# Patient Record
Sex: Female | Born: 1940 | Race: White | Hispanic: No | Marital: Married | Smoking: Never smoker
Health system: Southern US, Community
[De-identification: ages and names within clinical notes are randomized; demographics above are authoritative.]

## PROBLEM LIST (undated history)

## (undated) DIAGNOSIS — E785 Hyperlipidemia, unspecified: Secondary | ICD-10-CM

## (undated) DIAGNOSIS — H3530 Unspecified macular degeneration: Secondary | ICD-10-CM

## (undated) DIAGNOSIS — M1990 Unspecified osteoarthritis, unspecified site: Secondary | ICD-10-CM

## (undated) DIAGNOSIS — I10 Essential (primary) hypertension: Secondary | ICD-10-CM

## (undated) DIAGNOSIS — B019 Varicella without complication: Secondary | ICD-10-CM

## (undated) DIAGNOSIS — E119 Type 2 diabetes mellitus without complications: Secondary | ICD-10-CM

## (undated) DIAGNOSIS — K635 Polyp of colon: Secondary | ICD-10-CM

## (undated) DIAGNOSIS — D649 Anemia, unspecified: Secondary | ICD-10-CM

## (undated) HISTORY — DX: Type 2 diabetes mellitus without complications: E11.9

## (undated) HISTORY — DX: Unspecified macular degeneration: H35.30

## (undated) HISTORY — DX: Essential (primary) hypertension: I10

## (undated) HISTORY — DX: Anemia, unspecified: D64.9

## (undated) HISTORY — PX: BACK SURGERY: SHX140

## (undated) HISTORY — DX: Hyperlipidemia, unspecified: E78.5

## (undated) HISTORY — PX: BREAST EXCISIONAL BIOPSY: SUR124

## (undated) HISTORY — PX: BREAST SURGERY: SHX581

## (undated) HISTORY — PX: CATARACT EXTRACTION W/ INTRAOCULAR LENS  IMPLANT, BILATERAL: SHX1307

## (undated) HISTORY — DX: Polyp of colon: K63.5

---

## 1973-01-20 HISTORY — PX: EXCISION / BIOPSY BREAST / NIPPLE / DUCT: SUR469

## 1975-12-07 HISTORY — PX: BREAST SURGERY: SHX581

## 1979-12-07 HISTORY — PX: ABDOMINAL HYSTERECTOMY: SHX81

## 2004-07-06 HISTORY — PX: APPENDECTOMY: SHX54

## 2004-07-10 ENCOUNTER — Other Ambulatory Visit: Payer: Self-pay

## 2004-09-10 ENCOUNTER — Ambulatory Visit: Payer: Self-pay | Admitting: General Surgery

## 2004-10-12 ENCOUNTER — Ambulatory Visit: Payer: Self-pay | Admitting: General Surgery

## 2005-06-24 ENCOUNTER — Ambulatory Visit: Payer: Self-pay | Admitting: Family Medicine

## 2006-10-04 ENCOUNTER — Ambulatory Visit: Payer: Self-pay | Admitting: Family Medicine

## 2007-11-08 ENCOUNTER — Ambulatory Visit: Payer: Self-pay | Admitting: Family Medicine

## 2008-12-02 ENCOUNTER — Ambulatory Visit: Payer: Self-pay | Admitting: Specialist

## 2008-12-25 ENCOUNTER — Ambulatory Visit: Payer: Self-pay | Admitting: Family Medicine

## 2009-06-13 ENCOUNTER — Encounter: Admission: RE | Admit: 2009-06-13 | Discharge: 2009-06-13 | Payer: Self-pay | Admitting: Neurosurgery

## 2009-07-08 ENCOUNTER — Encounter: Admission: RE | Admit: 2009-07-08 | Discharge: 2009-07-08 | Payer: Self-pay | Admitting: Neurosurgery

## 2009-10-07 ENCOUNTER — Inpatient Hospital Stay (HOSPITAL_COMMUNITY): Admission: RE | Admit: 2009-10-07 | Discharge: 2009-10-10 | Payer: Self-pay | Admitting: Neurosurgery

## 2009-12-19 ENCOUNTER — Ambulatory Visit: Payer: Self-pay | Admitting: General Surgery

## 2010-01-06 ENCOUNTER — Ambulatory Visit: Payer: Self-pay | Admitting: Family Medicine

## 2010-12-22 ENCOUNTER — Ambulatory Visit: Payer: Self-pay

## 2011-01-13 ENCOUNTER — Ambulatory Visit: Payer: Self-pay | Admitting: Family Medicine

## 2011-03-10 LAB — GLUCOSE, CAPILLARY
Glucose-Capillary: 101 mg/dL — ABNORMAL HIGH (ref 70–99)
Glucose-Capillary: 103 mg/dL — ABNORMAL HIGH (ref 70–99)
Glucose-Capillary: 103 mg/dL — ABNORMAL HIGH (ref 70–99)
Glucose-Capillary: 128 mg/dL — ABNORMAL HIGH (ref 70–99)
Glucose-Capillary: 138 mg/dL — ABNORMAL HIGH (ref 70–99)
Glucose-Capillary: 146 mg/dL — ABNORMAL HIGH (ref 70–99)
Glucose-Capillary: 163 mg/dL — ABNORMAL HIGH (ref 70–99)
Glucose-Capillary: 173 mg/dL — ABNORMAL HIGH (ref 70–99)
Glucose-Capillary: 252 mg/dL — ABNORMAL HIGH (ref 70–99)

## 2011-03-11 LAB — CBC
MCV: 92.4 fL (ref 78.0–100.0)
Platelets: 374 10*3/uL (ref 150–400)
RBC: 3.52 MIL/uL — ABNORMAL LOW (ref 3.87–5.11)
RDW: 13.1 % (ref 11.5–15.5)

## 2011-03-11 LAB — BASIC METABOLIC PANEL
BUN: 18 mg/dL (ref 6–23)
Calcium: 9.9 mg/dL (ref 8.4–10.5)
Chloride: 104 mEq/L (ref 96–112)
Glucose, Bld: 113 mg/dL — ABNORMAL HIGH (ref 70–99)

## 2011-03-11 LAB — TYPE AND SCREEN: ABO/RH(D): A POS

## 2011-12-07 HISTORY — PX: EYE SURGERY: SHX253

## 2012-03-14 ENCOUNTER — Ambulatory Visit: Payer: Self-pay | Admitting: Family Medicine

## 2013-02-14 ENCOUNTER — Ambulatory Visit: Payer: Self-pay | Admitting: Family Medicine

## 2013-03-07 ENCOUNTER — Encounter: Payer: Self-pay | Admitting: General Surgery

## 2013-03-07 ENCOUNTER — Ambulatory Visit (INDEPENDENT_AMBULATORY_CARE_PROVIDER_SITE_OTHER): Payer: Medicare Other | Admitting: General Surgery

## 2013-03-07 VITALS — BP 124/72 | HR 72 | Resp 14 | Ht 62.0 in | Wt 205.0 lb

## 2013-03-07 DIAGNOSIS — Z86010 Personal history of colonic polyps: Secondary | ICD-10-CM

## 2013-03-07 MED ORDER — POLYETHYLENE GLYCOL 3350 17 GM/SCOOP PO POWD: ORAL | Status: DC

## 2013-03-07 NOTE — Progress Notes (Signed)
Patient ID: Jennifer Mcgee, female   DOB: Aug 02, 1941, 72 y.o.   MRN: [id number]  Chief Complaint  Patient presents with  . screening colonoscopy    HPI Jennifer Mcgee is a 72 y.o. female presents for pre-op colonoscopy. Patient's last colonoscopy was 3 years ago. At last colonoscopy polyps were found. No known family history of colon polyps or cancers. No complaints at this time. At last yearly physical patient's hemoglobin was low. She states she has a physical scheduled for next week.  HPI  Past Medical History  Diagnosis Date  . Hypertension   . Diabetes mellitus without complication   . Hyperlipidemia   . Macular degeneration   . Colon polyp   . Anemia     Past Surgical History  Procedure Laterality Date  . Colonoscopy    . Breast surgery    . Abdominal hysterectomy    . Appendectomy    . Back surgery    . Eye surgery Bilateral     cataract    Family History  Problem Relation Age of Onset  . Cancer Mother 69    breast     Social History History  Substance Use Topics  . Smoking status: Never Smoker   . Smokeless tobacco: Never Used  . Alcohol Use: No    Allergies  Allergen Reactions  . Epinephrine Other (See Comments)    Jerking movements all over the body.   . Penicillins Itching    Current Outpatient Prescriptions  Medication Sig Dispense Refill  . aspirin 81 MG tablet Take 81 mg by mouth daily.      . calcium carbonate (OS-CAL) 600 MG TABS Take 600 mg by mouth 2 (two) times daily with a meal.      . Cholecalciferol (VITAMIN D3) 3000 UNITS TABS Take 1 tablet by mouth daily.      . Cyanocobalamin (VITAMIN B 12 PO) Take 1,000 mg by mouth daily.      . Ferrous Sulfate (IRON) 325 (65 FE) MG TABS Take 1 tablet by mouth daily.      Marland Kitchen glipiZIDE (GLUCOTROL) 5 MG tablet Take 5 mg by mouth daily.       . hydrochlorothiazide (HYDRODIURIL) 12.5 MG tablet Take 12.5 mg by mouth daily.      . metFORMIN (GLUCOPHAGE) 1000 MG tablet Take 1,000 mg by mouth 2 (two)  times daily with a meal.       . Multiple Vitamins-Minerals (MULTIVITAMIN PO) Take 1 tablet by mouth daily.      . Multiple Vitamins-Minerals (PRESERVISION AREDS 2 PO) Take 2 tablets by mouth daily.      . polyethylene glycol powder (GLYCOLAX/MIRALAX) powder 255 grams one bottle for colonoscopy prep  255 g  0  . pravastatin (PRAVACHOL) 40 MG tablet Take 40 mg by mouth daily.      . quinapril (ACCUPRIL) 20 MG tablet Take 20 mg by mouth daily.        No current facility-administered medications for this visit.    Review of Systems Review of Systems  Constitutional: Negative.   Respiratory: Negative.   Cardiovascular: Negative.   Gastrointestinal: Negative.     Blood pressure 124/72, pulse 72, resp. rate 14, height 5\' 2"  (1.575 m), weight 205 lb (92.987 kg).  Physical Exam Physical Exam  Constitutional: She appears well-developed and well-nourished.  Eyes: Conjunctivae are normal. No scleral icterus.  Neck: Trachea normal. No mass and no thyromegaly present.  Cardiovascular: Normal rate, regular rhythm, normal heart sounds and normal  pulses.   Pulmonary/Chest: Effort normal and breath sounds normal.  Abdominal: Soft. Normal appearance and bowel sounds are normal. There is no hepatosplenomegaly. There is no tenderness. No hernia.    Data Reviewed    Assessment    History of colon polyp     Plan    F/U colopnoscopy        Lindley Stachnik G 03/09/2013, 12:17 PM

## 2013-03-07 NOTE — Patient Instructions (Addendum)
Patient to have colonoscopy scheduled. If normal patient to return in 5 years.    Colonoscopy A colonoscopy is an exam to evaluate your entire colon. In this exam, your colon is cleansed. A long fiberoptic tube is inserted through your rectum and into your colon. The fiberoptic scope (endoscope) is a long bundle of enclosed and very flexible fibers. These fibers transmit light to the area examined and send images from that area to your caregiver. Discomfort is usually minimal. You may be given a drug to help you sleep (sedative) during or prior to the procedure. This exam helps to detect lumps (tumors), polyps, inflammation, and areas of bleeding. Your caregiver may also take a small piece of tissue (biopsy) that will be examined under a microscope. LET YOUR CAREGIVER KNOW ABOUT:   Allergies to food or medicine.  Medicines taken, including vitamins, herbs, eyedrops, over-the-counter medicines, and creams.  Use of steroids (by mouth or creams).  Previous problems with anesthetics or numbing medicines.  History of bleeding problems or blood clots.  Previous surgery.  Other health problems, including diabetes and kidney problems.  Possibility of pregnancy, if this applies. BEFORE THE PROCEDURE   A clear liquid diet may be required for 2 days before the exam.  Ask your caregiver about changing or stopping your regular medications.  Liquid injections (enemas) or laxatives may be required.  A large amount of electrolyte solution may be given to you to drink over a short period of time. This solution is used to clean out your colon.  You should be present 60 minutes prior to your procedure or as directed by your caregiver. AFTER THE PROCEDURE   If you received a sedative or pain relieving medication, you will need to arrange for someone to drive you home.  Occasionally, there is a little blood passed with the first bowel movement. Do not be concerned. FINDING OUT THE RESULTS OF YOUR  TEST Not all test results are available during your visit. If your test results are not back during the visit, make an appointment with your caregiver to find out the results. Do not assume everything is normal if you have not heard from your caregiver or the medical facility. It is important for you to follow up on all of your test results. HOME CARE INSTRUCTIONS   It is not unusual to pass moderate amounts of gas and experience mild abdominal cramping following the procedure. This is due to air being used to inflate your colon during the exam. Walking or a warm pack on your belly (abdomen) may help.  You may resume all normal meals and activities after sedatives and medicines have worn off.  Only take over-the-counter or prescription medicines for pain, discomfort, or fever as directed by your caregiver. Do not use aspirin or blood thinners if a biopsy was taken. Consult your caregiver for medicine usage if biopsies were taken. SEEK IMMEDIATE MEDICAL CARE IF:   You have a fever.  You pass large blood clots or fill a toilet with blood following the procedure. This may also occur 10 to 14 days following the procedure. This is more likely if a biopsy was taken.  You develop abdominal pain that keeps getting worse and cannot be relieved with medicine. Document Released: 11/19/2000 Document Revised: 02/14/2012 Document Reviewed: 07/04/2008 [facility] Patient Information 2013 [place], [place].  Patient has been scheduled for a colonoscopy on 03-28-13 at [facility] She has been asked to hold metformin and glipizide day of colonoscopy prep and procedure.

## 2013-03-09 ENCOUNTER — Encounter: Payer: Self-pay | Admitting: General Surgery

## 2013-03-09 DIAGNOSIS — Z860100 Personal history of colon polyps, unspecified: Secondary | ICD-10-CM | POA: Insufficient documentation

## 2013-03-26 ENCOUNTER — Ambulatory Visit: Payer: Self-pay | Admitting: Family Medicine

## 2013-03-26 ENCOUNTER — Other Ambulatory Visit: Payer: Self-pay | Admitting: General Surgery

## 2013-03-26 ENCOUNTER — Encounter: Payer: Self-pay | Admitting: *Deleted

## 2013-03-26 DIAGNOSIS — Z86010 Personal history of colonic polyps: Secondary | ICD-10-CM

## 2013-03-26 LAB — HM DEXA SCAN

## 2013-03-28 ENCOUNTER — Ambulatory Visit: Payer: Self-pay | Admitting: General Surgery

## 2013-03-28 DIAGNOSIS — D129 Benign neoplasm of anus and anal canal: Secondary | ICD-10-CM

## 2013-03-28 DIAGNOSIS — D126 Benign neoplasm of colon, unspecified: Secondary | ICD-10-CM

## 2013-03-28 DIAGNOSIS — D128 Benign neoplasm of rectum: Secondary | ICD-10-CM

## 2013-03-28 DIAGNOSIS — Z86010 Personal history of colonic polyps: Secondary | ICD-10-CM

## 2013-03-28 DIAGNOSIS — K5730 Diverticulosis of large intestine without perforation or abscess without bleeding: Secondary | ICD-10-CM

## 2013-03-28 HISTORY — PX: COLONOSCOPY: SHX174

## 2013-03-28 LAB — HM COLONOSCOPY

## 2013-03-29 LAB — PATHOLOGY REPORT

## 2013-04-01 ENCOUNTER — Telehealth: Payer: Self-pay | Admitting: General Surgery

## 2013-04-01 NOTE — Telephone Encounter (Signed)
Pt was doing well after colonoscopy on wed 03/28/13. On Friday afternoon 03/30/13 she noted low grade fever. Yesterday and today she has had some left abd pain, and  fever to max of 100.8. Rx called in to [facility] [address] for Cipro 500mg  bid, Flagyl 500mg  tid-both for 7 days. Pt asked to come tomorrow am 8.30 for evaluation of suspected diverticulitis.

## 2013-04-02 ENCOUNTER — Ambulatory Visit (INDEPENDENT_AMBULATORY_CARE_PROVIDER_SITE_OTHER): Payer: Medicare Other | Admitting: General Surgery

## 2013-04-02 ENCOUNTER — Encounter: Payer: Self-pay | Admitting: General Surgery

## 2013-04-02 VITALS — BP 138/70 | HR 86 | Temp 97.9°F | Resp 18 | Ht 62.0 in | Wt 202.0 lb

## 2013-04-02 DIAGNOSIS — Z86010 Personal history of colonic polyps: Secondary | ICD-10-CM

## 2013-04-02 DIAGNOSIS — K5732 Diverticulitis of large intestine without perforation or abscess without bleeding: Secondary | ICD-10-CM

## 2013-04-02 NOTE — Progress Notes (Signed)
Patient ID: Jennifer Mcgee, female   DOB: [birth year], 72 y.o.   MRN: [id number]  Chief Complaint  Patient presents with  . Abdominal Pain    HPI Jennifer Mcgee is a 72 y.o. female here today for abdominal pain . She states she has been hurting since 03/30/13 in left abdomen with some low grade fever.  Has been using a heating pad. She also had   colonoscopy done 03/28/13. Colonoscopy showed extensive diverticulosis.  Patient was started on antibiotics-Cipro an Flagyl yesterday-please see telephone note from yesterday. HPI  Past Medical History  Diagnosis Date  . Hypertension   . Diabetes mellitus without complication   . Hyperlipidemia   . Macular degeneration   . Colon polyp   . Anemia     Past Surgical History  Procedure Laterality Date  . Colonoscopy  2014  . Breast surgery    . Abdominal hysterectomy    . Appendectomy    . Back surgery    . Eye surgery Bilateral     cataract    Family History  Problem Relation Age of Onset  . Cancer Mother 68    breast     Social History History  Substance Use Topics  . Smoking status: Never Smoker   . Smokeless tobacco: Never Used  . Alcohol Use: No    Allergies  Allergen Reactions  . Epinephrine Other (See Comments)    Jerking movements all over the body.   . Penicillins Itching    Current Outpatient Prescriptions  Medication Sig Dispense Refill  . aspirin 81 MG tablet Take 81 mg by mouth daily.      . calcium carbonate (OS-CAL) 600 MG TABS Take 600 mg by mouth 2 (two) times daily with a meal.      . Cholecalciferol (VITAMIN D3) 3000 UNITS TABS Take 1 tablet by mouth daily.      . Ferrous Sulfate (IRON) 325 (65 FE) MG TABS Take 1 tablet by mouth daily.      Marland Kitchen glipiZIDE (GLUCOTROL) 5 MG tablet Take 5 mg by mouth daily.       . hydrochlorothiazide (HYDRODIURIL) 12.5 MG tablet Take 12.5 mg by mouth daily.      . metFORMIN (GLUCOPHAGE) 1000 MG tablet Take 1,000 mg by mouth 2 (two) times daily with a meal.       .  Multiple Vitamins-Minerals (MULTIVITAMIN PO) Take 1 tablet by mouth daily.      . Multiple Vitamins-Minerals (PRESERVISION AREDS 2 PO) Take 2 tablets by mouth daily.      . polyethylene glycol powder (GLYCOLAX/MIRALAX) powder 255 grams one bottle for colonoscopy prep  255 g  0  . pravastatin (PRAVACHOL) 40 MG tablet Take 40 mg by mouth daily.      . quinapril (ACCUPRIL) 20 MG tablet Take 20 mg by mouth daily.       . Cyanocobalamin (VITAMIN B 12 PO) Take 1,000 mg by mouth daily.       No current facility-administered medications for this visit.    Review of Systems Review of Systems  Constitutional: Positive for fever. Negative for chills, diaphoresis, activity change, appetite change, fatigue and unexpected weight change.  Respiratory: Negative.   Cardiovascular: Negative.   Gastrointestinal: Positive for abdominal pain. Negative for nausea, vomiting, diarrhea, constipation, blood in stool, anal bleeding and rectal pain.    Blood pressure 138/70, pulse 86, temperature 97.9 F (36.6 C), resp. rate 18, height 5\' 2"  (1.575 m), weight 202 lb (91.627 kg).  Physical Exam Physical Exam  Constitutional: She appears well-developed and well-nourished.  Neck: Normal range of motion. Neck supple.  Cardiovascular: Normal rate, regular rhythm and normal heart sounds.   Pulmonary/Chest: Effort normal and breath sounds normal.  Abdominal: Soft. Bowel sounds are normal. There is tenderness in the left lower quadrant. There is guarding.    Data Reviewed None   Assessment   Sigdmoid diverticulitis    Plan    Continue with Cipro and Flagyl        Elston Aldape G 04/02/2013, 9:07 AM

## 2013-04-02 NOTE — Patient Instructions (Addendum)
Continue with Cipro and Flagyl. Call if pain worsens or additional abd symptoms.

## 2013-04-04 ENCOUNTER — Encounter: Payer: Self-pay | Admitting: General Surgery

## 2013-04-05 ENCOUNTER — Telehealth: Payer: Self-pay | Admitting: *Deleted

## 2013-04-05 NOTE — Telephone Encounter (Signed)
Phone call from patient states she has developed diarrhea from the Flagyl, she has taken it since Sunday.  Advised to stop taking it but to continue taking the Cipro. She agrees. She states the pain has completely stopped. She is aware that I will inform Dr Evette Cristal and if there any changes in care I would call her back, pt agrees.

## 2013-04-10 ENCOUNTER — Telehealth: Payer: Self-pay | Admitting: *Deleted

## 2013-04-10 NOTE — Telephone Encounter (Signed)
Notified patient as instructed, patient pleased. Discussed follow-up appointments (3 years), patient agrees

## 2013-04-10 NOTE — Telephone Encounter (Signed)
Message copied by Currie Paris on Tue Apr 10, 2013  8:41 AM ------      Message from: Kieth Brightly      Created: Tue Apr 10, 2013  7:33 AM       Please let pt pt know the pathology was normal-colon polyps ------

## 2013-04-13 ENCOUNTER — Ambulatory Visit: Payer: Self-pay | Admitting: Family Medicine

## 2013-04-18 ENCOUNTER — Encounter: Payer: Self-pay | Admitting: General Surgery

## 2013-04-24 ENCOUNTER — Encounter: Payer: Self-pay | Admitting: General Surgery

## 2013-04-24 ENCOUNTER — Ambulatory Visit (INDEPENDENT_AMBULATORY_CARE_PROVIDER_SITE_OTHER): Payer: Medicare Other | Admitting: General Surgery

## 2013-04-24 VITALS — BP 158/68 | HR 82 | Resp 14 | Ht 63.0 in | Wt 204.0 lb

## 2013-04-24 DIAGNOSIS — K5732 Diverticulitis of large intestine without perforation or abscess without bleeding: Secondary | ICD-10-CM

## 2013-04-24 DIAGNOSIS — Z86010 Personal history of colonic polyps: Secondary | ICD-10-CM

## 2013-04-24 NOTE — Patient Instructions (Addendum)
Patient to call with any abdominal symptoms.

## 2013-04-24 NOTE — Progress Notes (Signed)
Patient ID: Jennifer Mcgee, female   DOB: [birth year], 72 y.o.   MRN: [id number]  Chief Complaint  Patient presents with  . Follow-up    3 week follow up diverticulitis    Jennifer Mcgee is a 72 y.o. female who presents for a 3 week follow up for diverticulitis.  She previously stated she has been hurting since 03/30/13 in left abdomen with some low grade fever. Has been using a heating pad. She also had colonoscopy done 03/28/13. Colonoscopy showed extensive diverticulosis. Patient was started on antibiotics-Cipro an Flagyl.  She states she has completed both antibiotics and is feeling much better. The low grade fever has resolved. She states no complaints at this time.   Jennifer  Past Medical History  Diagnosis Date  . Hypertension   . Diabetes mellitus without complication   . Hyperlipidemia   . Macular degeneration   . Colon polyp   . Anemia     Past Surgical History  Procedure Laterality Date  . Colonoscopy  2014  . Breast surgery    . Abdominal hysterectomy    . Appendectomy    . Back surgery    . Eye surgery Bilateral     cataract    Family History  Problem Relation Age of Onset  . Cancer Mother 74    breast     Social History History  Substance Use Topics  . Smoking status: Never Smoker   . Smokeless tobacco: Never Used  . Alcohol Use: No    Allergies  Allergen Reactions  . Epinephrine Other (See Comments)    Jerking movements all over the body.   . Penicillins Itching    Current Outpatient Prescriptions  Medication Sig Dispense Refill  . aspirin 81 MG tablet Take 81 mg by mouth daily.      . calcium carbonate (OS-CAL) 600 MG TABS Take 600 mg by mouth 2 (two) times daily with a meal.      . Cholecalciferol (VITAMIN D3) 3000 UNITS TABS Take 1 tablet by mouth daily.      . Cyanocobalamin (VITAMIN B 12 PO) Take 1,000 mg by mouth daily.      . Ferrous Sulfate (IRON) 325 (65 FE) MG TABS Take 1 tablet by mouth daily.      Marland Kitchen glipiZIDE (GLUCOTROL) 5 MG  tablet Take 5 mg by mouth daily.       . hydrochlorothiazide (HYDRODIURIL) 12.5 MG tablet Take 12.5 mg by mouth daily.      . metFORMIN (GLUCOPHAGE) 1000 MG tablet Take 1,000 mg by mouth 2 (two) times daily with a meal.       . Multiple Vitamins-Minerals (MULTIVITAMIN PO) Take 1 tablet by mouth daily.      . Multiple Vitamins-Minerals (PRESERVISION AREDS 2 PO) Take 2 tablets by mouth daily.      . pravastatin (PRAVACHOL) 40 MG tablet Take 40 mg by mouth daily.      . quinapril (ACCUPRIL) 20 MG tablet Take 20 mg by mouth daily.        No current facility-administered medications for this visit.    Review of Systems Review of Systems  Constitutional: Negative.   Respiratory: Negative.   Cardiovascular: Negative.   Gastrointestinal: Negative.     Blood pressure 158/68, pulse 82, resp. rate 14, height 5\' 3"  (1.6 m), weight 204 lb (92.534 kg).  Physical Exam Physical Exam  Constitutional: She appears well-developed and well-nourished.  Abdominal: Normal appearance and bowel sounds are normal. There is no  hepatosplenomegaly. There is no tenderness. No hernia.    Data Reviewed None  Assessment    Diverticulitis resolved.     Plan    Call for any recurrent abdominal symptoms.       Jennifer Mcgee 04/24/2013, 11:20 AM

## 2013-07-11 ENCOUNTER — Other Ambulatory Visit: Payer: Self-pay

## 2013-10-11 ENCOUNTER — Other Ambulatory Visit: Payer: Self-pay

## 2014-03-29 LAB — CBC AND DIFFERENTIAL
HEMATOCRIT: 36 % (ref 36–46)
HEMOGLOBIN: 11.6 g/dL — AB (ref 12.0–16.0)
Platelets: 405 10*3/uL — AB (ref 150–399)
WBC: 7.7 10*3/mL

## 2014-03-29 LAB — BASIC METABOLIC PANEL
BUN: 21 mg/dL (ref 4–21)
CREATININE: 0.8 mg/dL (ref 0.5–1.1)
Glucose: 125 mg/dL
Potassium: 4.8 mmol/L (ref 3.4–5.3)
Sodium: 144 mmol/L (ref 137–147)

## 2014-03-29 LAB — LIPID PANEL
CHOLESTEROL: 207 mg/dL — AB (ref 0–200)
HDL: 62 mg/dL (ref 35–70)
LDL Cholesterol: 125 mg/dL
TRIGLYCERIDES: 100 mg/dL (ref 40–160)

## 2014-03-29 LAB — HEPATIC FUNCTION PANEL
ALT: 19 U/L (ref 7–35)
AST: 20 U/L (ref 13–35)

## 2014-04-19 ENCOUNTER — Ambulatory Visit: Payer: Self-pay | Admitting: Family Medicine

## 2014-04-19 LAB — HM MAMMOGRAPHY

## 2014-10-07 ENCOUNTER — Encounter: Payer: Self-pay | Admitting: General Surgery

## 2015-01-15 ENCOUNTER — Ambulatory Visit: Payer: Self-pay | Admitting: Family Medicine

## 2015-02-28 LAB — HEMOGLOBIN A1C: Hgb A1c MFr Bld: 6.3 % — AB (ref 4.0–6.0)

## 2015-04-03 DIAGNOSIS — H3530 Unspecified macular degeneration: Secondary | ICD-10-CM | POA: Insufficient documentation

## 2015-04-03 DIAGNOSIS — M109 Gout, unspecified: Secondary | ICD-10-CM | POA: Insufficient documentation

## 2015-04-03 DIAGNOSIS — M1990 Unspecified osteoarthritis, unspecified site: Secondary | ICD-10-CM | POA: Insufficient documentation

## 2015-04-03 DIAGNOSIS — I10 Essential (primary) hypertension: Secondary | ICD-10-CM | POA: Insufficient documentation

## 2015-04-03 DIAGNOSIS — Z9189 Other specified personal risk factors, not elsewhere classified: Secondary | ICD-10-CM | POA: Insufficient documentation

## 2015-04-03 DIAGNOSIS — J45909 Unspecified asthma, uncomplicated: Secondary | ICD-10-CM | POA: Insufficient documentation

## 2015-04-03 DIAGNOSIS — G8929 Other chronic pain: Secondary | ICD-10-CM | POA: Insufficient documentation

## 2015-04-03 DIAGNOSIS — K5790 Diverticulosis of intestine, part unspecified, without perforation or abscess without bleeding: Secondary | ICD-10-CM | POA: Insufficient documentation

## 2015-04-03 DIAGNOSIS — M549 Dorsalgia, unspecified: Secondary | ICD-10-CM

## 2015-04-03 DIAGNOSIS — D649 Anemia, unspecified: Secondary | ICD-10-CM | POA: Insufficient documentation

## 2015-04-03 DIAGNOSIS — R319 Hematuria, unspecified: Secondary | ICD-10-CM | POA: Insufficient documentation

## 2015-04-03 DIAGNOSIS — E870 Hyperosmolality and hypernatremia: Secondary | ICD-10-CM | POA: Insufficient documentation

## 2015-04-03 DIAGNOSIS — E119 Type 2 diabetes mellitus without complications: Secondary | ICD-10-CM | POA: Insufficient documentation

## 2015-04-03 DIAGNOSIS — F4320 Adjustment disorder, unspecified: Secondary | ICD-10-CM | POA: Insufficient documentation

## 2015-04-03 DIAGNOSIS — K635 Polyp of colon: Secondary | ICD-10-CM | POA: Insufficient documentation

## 2015-04-03 DIAGNOSIS — N952 Postmenopausal atrophic vaginitis: Secondary | ICD-10-CM | POA: Insufficient documentation

## 2015-04-03 DIAGNOSIS — E7800 Pure hypercholesterolemia, unspecified: Secondary | ICD-10-CM | POA: Insufficient documentation

## 2015-04-15 ENCOUNTER — Other Ambulatory Visit: Payer: Self-pay | Admitting: Family Medicine

## 2015-04-15 DIAGNOSIS — Z1231 Encounter for screening mammogram for malignant neoplasm of breast: Secondary | ICD-10-CM

## 2015-04-21 ENCOUNTER — Other Ambulatory Visit: Payer: Self-pay | Admitting: Family Medicine

## 2015-04-21 ENCOUNTER — Ambulatory Visit
Admission: RE | Admit: 2015-04-21 | Discharge: 2015-04-21 | Disposition: A | Discharge status: Home / Self Care | Payer: Medicare Other | Source: Ambulatory Visit | Attending: Family Medicine | Admitting: Family Medicine

## 2015-04-21 DIAGNOSIS — Z1231 Encounter for screening mammogram for malignant neoplasm of breast: Secondary | ICD-10-CM

## 2015-05-23 ENCOUNTER — Ambulatory Visit (INDEPENDENT_AMBULATORY_CARE_PROVIDER_SITE_OTHER): Payer: Medicare Other | Admitting: Physician Assistant

## 2015-05-23 ENCOUNTER — Encounter: Payer: Self-pay | Admitting: Physician Assistant

## 2015-05-23 VITALS — BP 162/78 | HR 105 | Temp 98.5°F | Resp 18

## 2015-05-23 DIAGNOSIS — I10 Essential (primary) hypertension: Secondary | ICD-10-CM | POA: Diagnosis not present

## 2015-05-23 DIAGNOSIS — E7800 Pure hypercholesterolemia, unspecified: Secondary | ICD-10-CM

## 2015-05-23 DIAGNOSIS — M10071 Idiopathic gout, right ankle and foot: Secondary | ICD-10-CM | POA: Diagnosis not present

## 2015-05-23 DIAGNOSIS — M109 Gout, unspecified: Secondary | ICD-10-CM

## 2015-05-23 MED ORDER — INDOMETHACIN 20 MG PO CAPS: 1.0000 | ORAL_CAPSULE | Freq: Three times a day (TID) | ORAL | Status: DC

## 2015-05-23 NOTE — Progress Notes (Signed)
   Subjective:    Patient ID: Jennifer Mcgee, female    DOB: 25-Feb-1941, 74 y.o.   MRN: [id number]  Toe Pain  The incident occurred 2 days ago. The incident occurred at the gym. There was no injury mechanism. The pain is present in the right toes (right 2nd toe at DIP joint). The quality of the pain is described as aching and stabbing. The pain is at a severity of 3/10. The pain is mild. The pain has been constant (worse when she wears a shoe or any compression to the joint) since onset. Pertinent negatives include no inability to bear weight, loss of motion, loss of sensation, muscle weakness, numbness or tingling. She reports no foreign bodies present. The symptoms are aggravated by palpation. She has tried nothing for the symptoms.      Review of Systems  Constitutional: Negative for fever and chills.  Gastrointestinal: Negative for nausea and vomiting.  Musculoskeletal: Positive for back pain (constant and not new), joint swelling (swelling over DIP joint right 2nd toe), arthralgias and gait problem. Negative for myalgias.  Skin: Negative for rash and wound. Color change: erythema.  Neurological: Negative for tingling and numbness.       Objective:   Physical Exam  Constitutional: She appears well-developed and well-nourished. No distress.  Musculoskeletal: Normal range of motion. She exhibits edema (mild edema over R 2nd toe DIP joint) and tenderness (TTP over R 2nd toe DIP joint line medial aspect).  Skin: Skin is warm and dry. No rash noted. She is not diaphoretic. There is erythema (over R 2nd DIP joint on right foot). No pallor.  Vitals reviewed.         Assessment & Plan:   1. Acute gout of right foot, unspecified cause Will check labs and treat as high suspicion.  Question if recently starting lipitor may have caused flare. - Uric acid - Sed Rate (ESR) - Indomethacin 20 MG CAPS; Take 1 capsule by mouth every 8 (eight) hours.  Dispense: 30 capsule; Refill: 0  2.  Hypercholesteremia Recently started lipitor.  Will check labs to check kidney function. - Comprehensive Metabolic Panel (CMET)  3. Essential hypertension Will check labs to check kidney function. - Comprehensive Metabolic Panel (CMET)

## 2015-05-23 NOTE — Patient Instructions (Signed)

## 2015-05-24 LAB — URIC ACID: URIC ACID: 5.7 mg/dL (ref 2.5–7.1)

## 2015-05-24 LAB — COMPREHENSIVE METABOLIC PANEL
ALK PHOS: 82 IU/L (ref 39–117)
ALT: 22 IU/L (ref 0–32)
AST: 21 IU/L (ref 0–40)
Albumin/Globulin Ratio: 1.7 (ref 1.1–2.5)
Albumin: 4.3 g/dL (ref 3.5–4.8)
BUN / CREAT RATIO: 22 (ref 11–26)
BUN: 19 mg/dL (ref 8–27)
Bilirubin Total: 0.4 mg/dL (ref 0.0–1.2)
CO2: 26 mmol/L (ref 18–29)
Calcium: 9.8 mg/dL (ref 8.7–10.3)
Chloride: 98 mmol/L (ref 97–108)
Creatinine, Ser: 0.86 mg/dL (ref 0.57–1.00)
GFR calc Af Amer: 78 mL/min/{1.73_m2} (ref >59)
GFR, EST NON AFRICAN AMERICAN: 67 mL/min/{1.73_m2} (ref >59)
GLOBULIN, TOTAL: 2.6 g/dL (ref 1.5–4.5)
Glucose: 123 mg/dL — ABNORMAL HIGH (ref 65–99)
Potassium: 4.6 mmol/L (ref 3.5–5.2)
SODIUM: 142 mmol/L (ref 134–144)
Total Protein: 6.9 g/dL (ref 6.0–8.5)

## 2015-05-24 LAB — SEDIMENTATION RATE: Sed Rate: 10 mm/hr (ref 0–40)

## 2015-05-26 ENCOUNTER — Telehealth: Payer: Self-pay

## 2015-05-26 ENCOUNTER — Telehealth: Payer: Self-pay | Admitting: Physician Assistant

## 2015-05-26 DIAGNOSIS — M109 Gout, unspecified: Secondary | ICD-10-CM

## 2015-05-26 NOTE — Telephone Encounter (Signed)
Pharmacy requested received from [facility] for a prior authorization for Tivorbex.

## 2015-05-26 NOTE — Telephone Encounter (Signed)
Patient advised as directed below. Patient verbalized understanding.  

## 2015-05-26 NOTE — Telephone Encounter (Signed)
-----   Message from Mar Daring, PA-C sent at 05/26/2015  9:30 AM EDT ----- All labs are within normal limits and stable.  Thanks! -JB

## 2015-05-26 NOTE — Telephone Encounter (Signed)
Patient advised of lab results. Patient verbalized understanding.

## 2015-05-30 MED ORDER — NAPROXEN 500 MG PO TABS: 500.0000 mg | ORAL_TABLET | Freq: Two times a day (BID) | ORAL | Status: DC

## 2015-05-30 NOTE — Telephone Encounter (Signed)
Spoke with patient about insurance denial of indomethacin.  Will treat with naprosyn.  She is to call the office on Monday if no relief and she has a f/u with Dr. Venia Minks on [facility] 06/04/15.

## 2015-06-04 ENCOUNTER — Ambulatory Visit (INDEPENDENT_AMBULATORY_CARE_PROVIDER_SITE_OTHER): Payer: Medicare Other | Admitting: Family Medicine

## 2015-06-04 ENCOUNTER — Encounter: Payer: Self-pay | Admitting: Family Medicine

## 2015-06-04 VITALS — BP 128/84 | HR 88 | Temp 97.9°F | Resp 16 | Wt 213.0 lb

## 2015-06-04 DIAGNOSIS — E7800 Pure hypercholesterolemia, unspecified: Secondary | ICD-10-CM

## 2015-06-04 DIAGNOSIS — E119 Type 2 diabetes mellitus without complications: Secondary | ICD-10-CM

## 2015-06-04 DIAGNOSIS — I10 Essential (primary) hypertension: Secondary | ICD-10-CM

## 2015-06-04 DIAGNOSIS — M10071 Idiopathic gout, right ankle and foot: Secondary | ICD-10-CM

## 2015-06-04 DIAGNOSIS — M109 Gout, unspecified: Secondary | ICD-10-CM

## 2015-06-04 NOTE — Assessment & Plan Note (Signed)
Unclear if had gout flare, labs normal. Will consider referral for joint aspiration if recurs.

## 2015-06-04 NOTE — Progress Notes (Signed)
Subjective:    Patient ID: Jennifer Mcgee, female    DOB: 03-31-1941, 74 y.o.   MRN: [id number]  Diabetes She presents for her follow-up (A1C on 02/28/2015 was 6.3%) diabetic visit. She has type 2 diabetes mellitus. There are no hypoglycemic associated symptoms. Pertinent negatives for hypoglycemia include no headaches or sweats. Pertinent negatives for diabetes include no blurred vision, no chest pain, no fatigue, no foot paresthesias, no foot ulcerations, no polydipsia, no polyphagia, no polyuria, no visual change, no weakness and no weight loss. Current diabetic treatment includes oral agent (dual therapy) (Metformin and Glipizide). Her weight is stable. Her breakfast blood glucose is taken between 6-7 am. Her breakfast blood glucose range is generally 110-130 mg/dl. She does not see a podiatrist.Eye exam is current.  Hypertension Pertinent negatives include no anxiety, blurred vision, chest pain, headaches, malaise/fatigue, neck pain, orthopnea, palpitations, peripheral edema, shortness of breath or sweats. Treatments tried: Quinapril and HCTZ.  Hyperlipidemia This is a chronic problem. The problem is controlled. Recent lipid tests were reviewed and are high. Exacerbating diseases include diabetes. Pertinent negatives include no chest pain or shortness of breath. Current antihyperlipidemic treatment includes statins (Atorvastatin 20 mg was started at [place]; pt needs lipids rechecked). Risk factors for coronary artery disease include diabetes mellitus, dyslipidemia and hypertension.      Review of Systems  Constitutional: Negative for weight loss, malaise/fatigue and fatigue.  Eyes: Negative for blurred vision.  Respiratory: Negative for shortness of breath.   Cardiovascular: Negative for chest pain, palpitations and orthopnea.  Endocrine: Negative for polydipsia, polyphagia and polyuria.  Musculoskeletal: Negative for neck pain.  Neurological: Negative for weakness and headaches.   BP  128/84 mmHg  Pulse 88  Temp(Src) 97.9 F (36.6 C) (Oral)  Resp 16  Wt 213 lb (96.616 kg)   Patient Active Problem List   Diagnosis Date Noted  . Adaptation reaction 04/03/2015  . Absolute anemia 04/03/2015  . At risk of disease 04/03/2015  . 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 Back pain, chronic 04/03/2015  . Colon polyp 04/03/2015  . Diabetes 04/03/2015  . 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 Gout 04/03/2015  . Blood in the urine 04/03/2015  . Calcium blood increased 04/03/2015  . Hypercholesteremia 04/03/2015  . High sodium levels 04/03/2015  . BP (high blood pressure) 04/03/2015  . 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 Arthritis, degenerative 04/03/2015  . RAD (reactive airway disease) 04/03/2015  . Diverticulitis of colon without hemorrhage 04/02/2013  . Personal history of colonic polyps 03/09/2013   Past Medical History  Diagnosis Date  . Hypertension   . Diabetes mellitus without complication   . Hyperlipidemia   . Macular degeneration   . Colon polyp   . Anemia    Current Outpatient Prescriptions on File Prior to Visit  Medication Sig  . ACCU-CHEK SMARTVIEW test strip CHECK DAILY  . albuterol (PROVENTIL HFA;VENTOLIN HFA) 108 (90 BASE) MCG/ACT inhaler Inhale into the lungs.  Marland Kitchen aspirin 81 MG tablet Take by mouth.  Marland Kitchen atorvastatin (LIPITOR) 20 MG tablet Take by mouth.  . Cholecalciferol (VITAMIN D3) 2000 UNITS TABS Take by mouth.  . Cyanocobalamin (VITAMIN B 12 PO) Take 1,000 mg by mouth daily.  . ferrous sulfate 325 (65 FE) MG tablet Take by mouth.  Marland Kitchen glipiZIDE (GLUCOTROL XL) 2.5 MG 24 hr tablet Take by mouth.  . hydrochlorothiazide (HYDRODIURIL) 12.5 MG tablet Take 12.5 mg by mouth daily.  Marland Kitchen MAGNESIUM GLUCONATE PO Take by mouth.  . metFORMIN (GLUCOPHAGE) 1000 MG tablet Take  1,000 mg by mouth 2 (two) times daily with a meal.   . Multiple Vitamins-Minerals (MULTIVITAMIN PO) Take 1 tablet by mouth daily.  . Multiple Vitamins-Minerals (PRESERVISION/LUTEIN) CAPS Take  by mouth.  . naproxen (NAPROSYN) 500 MG tablet Take 1 tablet (500 mg total) by mouth 2 (two) times daily with a meal.  . quinapril (ACCUPRIL) 20 MG tablet Take 20 mg by mouth daily.    No current facility-administered medications on file prior to visit.   Allergies  Allergen Reactions  . Epinephrine Other (See Comments)    Feels jumpy Jerking movements all over the body.   . Penicillins Itching  . Levaquin [Levofloxacin In D5w] Anxiety   Past Surgical History  Procedure Laterality Date  . Colonoscopy  2014  . Back surgery    . Abdominal hysterectomy  1981  . Breast surgery      biopsy- benign  . Breast surgery  1977    duct operation  . Appendectomy  07/2004    Dr. Jamal Collin  . Eye surgery Bilateral 2013    cataract  . Excision / biopsy breast / nipple / duct Left 01/20/1973    milk duct removed   History   Social History  . Marital Status: Married    Spouse Name: N/A  . Number of Children: N/A  . Years of Education: N/A   Occupational History  . Not on file.   Social History Main Topics  . Smoking status: Never Smoker   . Smokeless tobacco: Never Used  . Alcohol Use: Yes     Comment: rare  . Drug Use: No  . Sexual Activity: Not on file   Other Topics Concern  . Not on file   Social History Narrative   Family History  Problem Relation Age of Onset  . Cancer Mother 64    breast   . Hypertension Mother   . Heart disease Mother   . Hyperlipidemia Mother   . COPD Mother   . Hypertension Father   . Dementia Father   . Diabetes Paternal Grandmother   . Heart attack Paternal Grandfather           Objective:   Physical Exam  Constitutional: She is oriented to person, place, and time. She appears well-developed and well-nourished.  Musculoskeletal: Normal range of motion.  2nd toe with erythema, no tenderness today.   Neurological: She is alert and oriented to person, place, and time.  Psychiatric: She has a normal mood and affect. Her behavior is  normal. Judgment and thought content normal.   BP 128/84 mmHg  Pulse 88  Temp(Src) 97.9 F (36.6 C) (Oral)  Resp 16  Wt 213 lb (96.616 kg)        Assessment & Plan:  1. Essential hypertension Improved today. Continue current medication for now. Consider changing HCTZ if has gout.   Follow up for Wellness in 3 months.    2. Type 2 diabetes mellitus without complication Will check labs today.  - Hemoglobin A1c  3. Hypercholesteremia No trouble with medication. Check labs today.  - Lipid Profile - Comprehensive metabolic panel  4. Acute gout of right foot, unspecified cause Resolved. Labs did not confirm gout. Will readdress and consider joint aspiration if occurs again.   [name], MD

## 2015-06-05 LAB — COMPREHENSIVE METABOLIC PANEL
ALK PHOS: 83 IU/L (ref 39–117)
ALT: 22 IU/L (ref 0–32)
AST: 23 IU/L (ref 0–40)
Albumin/Globulin Ratio: 1.7 (ref 1.1–2.5)
Albumin: 4.3 g/dL (ref 3.5–4.8)
BILIRUBIN TOTAL: 0.6 mg/dL (ref 0.0–1.2)
BUN / CREAT RATIO: 26 (ref 11–26)
BUN: 22 mg/dL (ref 8–27)
CALCIUM: 9.6 mg/dL (ref 8.7–10.3)
CO2: 27 mmol/L (ref 18–29)
Chloride: 105 mmol/L (ref 97–108)
Creatinine, Ser: 0.86 mg/dL (ref 0.57–1.00)
GFR calc Af Amer: 78 mL/min/{1.73_m2} (ref >59)
GFR calc non Af Amer: 67 mL/min/{1.73_m2} (ref >59)
GLUCOSE: 116 mg/dL — AB (ref 65–99)
Globulin, Total: 2.6 g/dL (ref 1.5–4.5)
POTASSIUM: 5.3 mmol/L — AB (ref 3.5–5.2)
Sodium: 147 mmol/L — ABNORMAL HIGH (ref 134–144)
Total Protein: 6.9 g/dL (ref 6.0–8.5)

## 2015-06-05 LAB — LIPID PANEL
Chol/HDL Ratio: 2.9 ratio units (ref 0.0–4.4)
Cholesterol, Total: 188 mg/dL (ref 100–199)
HDL: 65 mg/dL (ref >39)
LDL Calculated: 103 mg/dL — ABNORMAL HIGH (ref 0–99)
TRIGLYCERIDES: 102 mg/dL (ref 0–149)
VLDL Cholesterol Cal: 20 mg/dL (ref 5–40)

## 2015-06-05 LAB — HEMOGLOBIN A1C
ESTIMATED AVERAGE GLUCOSE: 143 mg/dL
Hgb A1c MFr Bld: 6.6 % — ABNORMAL HIGH (ref 4.8–5.6)

## 2015-07-22 ENCOUNTER — Other Ambulatory Visit: Payer: Self-pay

## 2015-07-22 MED ORDER — METFORMIN HCL 1000 MG PO TABS
1000.0000 mg | ORAL_TABLET | Freq: Two times a day (BID) | ORAL | Status: DC
Start: 2015-07-22 — End: 2015-07-23

## 2015-07-22 MED ORDER — ATORVASTATIN CALCIUM 20 MG PO TABS: 20.0000 mg | ORAL_TABLET | Freq: Every day | ORAL | Status: DC

## 2015-07-23 ENCOUNTER — Other Ambulatory Visit: Payer: Self-pay

## 2015-07-23 DIAGNOSIS — E119 Type 2 diabetes mellitus without complications: Secondary | ICD-10-CM

## 2015-07-23 DIAGNOSIS — E7800 Pure hypercholesterolemia, unspecified: Secondary | ICD-10-CM

## 2015-07-23 MED ORDER — ATORVASTATIN CALCIUM 20 MG PO TABS: 20.0000 mg | ORAL_TABLET | Freq: Every day | ORAL | Status: DC

## 2015-07-23 MED ORDER — METFORMIN HCL 1000 MG PO TABS: 1000.0000 mg | ORAL_TABLET | Freq: Two times a day (BID) | ORAL | Status: DC

## 2015-07-23 NOTE — Telephone Encounter (Signed)
This is a pt of Dr. Sharyon Medicus. Last iv was on 06/04/2015 and a future ov is on 09/18/2015.  Thanks,

## 2015-08-28 ENCOUNTER — Other Ambulatory Visit: Payer: Self-pay | Admitting: Family Medicine

## 2015-08-28 DIAGNOSIS — E119 Type 2 diabetes mellitus without complications: Secondary | ICD-10-CM

## 2015-09-18 ENCOUNTER — Encounter: Payer: Medicare Other | Admitting: Family Medicine

## 2015-09-30 ENCOUNTER — Ambulatory Visit (INDEPENDENT_AMBULATORY_CARE_PROVIDER_SITE_OTHER): Payer: Medicare Other | Admitting: Family Medicine

## 2015-09-30 ENCOUNTER — Encounter: Payer: Self-pay | Admitting: Family Medicine

## 2015-09-30 VITALS — BP 150/68 | HR 110 | Temp 98.8°F | Resp 20

## 2015-09-30 DIAGNOSIS — R059 Cough, unspecified: Secondary | ICD-10-CM

## 2015-09-30 DIAGNOSIS — J4531 Mild persistent asthma with (acute) exacerbation: Secondary | ICD-10-CM

## 2015-09-30 DIAGNOSIS — J40 Bronchitis, not specified as acute or chronic: Secondary | ICD-10-CM | POA: Diagnosis not present

## 2015-09-30 DIAGNOSIS — R05 Cough: Secondary | ICD-10-CM

## 2015-09-30 MED ORDER — AZITHROMYCIN 250 MG PO TABS: ORAL_TABLET | ORAL | Status: DC

## 2015-09-30 MED ORDER — PREDNISONE 10 MG PO TABS: ORAL_TABLET | ORAL | Status: DC

## 2015-09-30 MED ORDER — HYDROCODONE-HOMATROPINE 5-1.5 MG/5ML PO SYRP: 5.0000 mL | ORAL_SOLUTION | Freq: Three times a day (TID) | ORAL | Status: DC | PRN

## 2015-09-30 NOTE — Progress Notes (Signed)
Subjective:    Patient ID: Jennifer Mcgee, female    DOB: [birth year], 74 y.o.   MRN: [id number]  URI  This is a new problem. The current episode started yesterday. The problem has been gradually worsening. The maximum temperature recorded prior to her arrival was 101 - 101.9 F. The fever has been present for less than 1 day. Associated symptoms include chest pain, congestion, coughing, diarrhea, headaches, neck pain, rhinorrhea, sneezing, a sore throat and wheezing. Pertinent negatives include no abdominal pain, dysuria, ear pain, joint pain, joint swelling, nausea, plugged ear sensation, rash, sinus pain, swollen glands or vomiting. She has tried inhaler use for the symptoms. The treatment provided no relief (Struggling to breathe. ).  Pt reports she has a H/O RAD. Has had very similar symptoms in the past. Gets something like this once or twice a year. Here with her husband today.     Review of Systems  HENT: Positive for congestion, rhinorrhea, sneezing and sore throat. Negative for ear pain.   Respiratory: Positive for cough and wheezing.   Cardiovascular: Positive for chest pain.  Gastrointestinal: Positive for diarrhea. Negative for nausea, vomiting and abdominal pain.  Genitourinary: Negative for dysuria.  Musculoskeletal: Positive for neck pain. Negative for joint pain.  Skin: Negative for rash.  Neurological: Positive for headaches.   BP 150/68 mmHg  Pulse 110  Temp(Src) 98.8 F (37.1 C) (Oral)  Resp 20  SpO2 94%   Patient Active Problem List   Diagnosis Date Noted  . Adaptation reaction 04/03/2015  . Absolute anemia 04/03/2015  . At risk of disease 04/03/2015  . 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 Back pain, chronic 04/03/2015  . Colon polyp 04/03/2015  . Diabetes ([place]) 04/03/2015  . 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 Gout 04/03/2015  . Blood in the urine 04/03/2015  . Calcium blood increased 04/03/2015  . Hypercholesteremia 04/03/2015  . High sodium levels  04/03/2015  . BP (high blood pressure) 04/03/2015  . 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 Arthritis, degenerative 04/03/2015  . RAD (reactive airway disease) 04/03/2015  . Diverticulitis of colon without hemorrhage 04/02/2013  . Personal history of colonic polyps 03/09/2013   Past Medical History  Diagnosis Date  . Hypertension   . Diabetes mellitus without complication ([place])   . Hyperlipidemia   . Macular degeneration   . Colon polyp   . Anemia    Current Outpatient Prescriptions on File Prior to Visit  Medication Sig  . ACCU-CHEK SMARTVIEW test strip CHECK DAILY  . albuterol (PROVENTIL HFA;VENTOLIN HFA) 108 (90 BASE) MCG/ACT inhaler Inhale into the lungs.  Marland Kitchen aspirin 81 MG tablet Take by mouth.  Marland Kitchen atorvastatin (LIPITOR) 20 MG tablet Take 1 tablet (20 mg total) by mouth at bedtime.  . Cholecalciferol (VITAMIN D3) 2000 UNITS TABS Take by mouth.  . Cyanocobalamin (VITAMIN B 12 PO) Take 1,000 mg by mouth daily.  . ferrous sulfate 325 (65 FE) MG tablet Take by mouth.  Marland Kitchen GLIPIZIDE XL 2.5 MG 24 hr tablet TAKE 1 TABLET EVERY DAY  . hydrochlorothiazide (HYDRODIURIL) 12.5 MG tablet Take 12.5 mg by mouth daily.  Marland Kitchen MAGNESIUM GLUCONATE PO Take by mouth.  . metFORMIN (GLUCOPHAGE) 1000 MG tablet Take 1 tablet (1,000 mg total) by mouth 2 (two) times daily with a meal.  . Multiple Vitamins-Minerals (MULTIVITAMIN PO) Take 1 tablet by mouth daily.  . Multiple Vitamins-Minerals (PRESERVISION/LUTEIN) CAPS Take by mouth.  . naproxen (NAPROSYN) 500 MG tablet Take 1 tablet (500 mg total) by mouth 2 (  two) times daily with a meal.  . quinapril (ACCUPRIL) 20 MG tablet Take 20 mg by mouth daily.    No current facility-administered medications on file prior to visit.   Allergies  Allergen Reactions  . Epinephrine Other (See Comments)    Feels jumpy Jerking movements all over the body.   . Penicillins Itching  . Levaquin [Levofloxacin In D5w] Anxiety   Past Surgical History  Procedure Laterality Date    . Colonoscopy  2014  . Back surgery    . Abdominal hysterectomy  1981  . Breast surgery      biopsy- benign  . Breast surgery  1977    duct operation  . Appendectomy  07/2004    Dr. Jamal Collin  . Eye surgery Bilateral 2013    cataract  . Excision / biopsy breast / nipple / duct Left 01/20/1973    milk duct removed   Social History   Social History  . Marital Status: Married    Spouse Name: N/A  . Number of Children: N/A  . Years of Education: N/A   Occupational History  . Not on file.   Social History Main Topics  . Smoking status: Never Smoker   . Smokeless tobacco: Never Used  . Alcohol Use: Yes     Comment: rare  . Drug Use: No  . Sexual Activity: Not on file   Other Topics Concern  . Not on file   Social History Narrative   Family History  Problem Relation Age of Onset  . Cancer Mother 59    breast   . Hypertension Mother   . Heart disease Mother   . Hyperlipidemia Mother   . COPD Mother   . Hypertension Father   . Dementia Father   . Diabetes Paternal Grandmother   . Heart attack Paternal Grandfather       Objective:   Physical Exam  Constitutional: She is oriented to person, place, and time. She appears well-developed and well-nourished.  HENT:  Head: Normocephalic and atraumatic.  Right Ear: External ear normal.  Left Ear: External ear normal.  Mouth/Throat: Oropharynx is clear and moist.  Eyes: Conjunctivae and EOM are normal. Pupils are equal, round, and reactive to light.  Neck: Normal range of motion. Neck supple.  Cardiovascular: Regular rhythm.   Increased rate.   Pulmonary/Chest: Effort normal. She has wheezes.  Some mildly increased WOB.   Neurological: She is alert and oriented to person, place, and time.  Psychiatric: She has a normal mood and affect. Her behavior is normal. Judgment and thought content normal.  BP 150/68 mmHg  Pulse 110  Temp(Src) 98.8 F (37.1 C) (Oral)  Resp 20  SpO2 94%     Assessment & Plan:  1.  Cough Negative for flu.  Will give cough syrup to help with sleep.  - POCT Influenza A/B - HYDROcodone-homatropine (HYCODAN) 5-1.5 MG/5ML syrup; Take 5 mLs by mouth every 8 (eight) hours as needed for cough.  Dispense: 120 mL; Refill: 0  2. RAD (reactive airway disease), mild persistent, with acute exacerbation Condition is worsening. Will start medication for better control.  Patient instructed to call back if condition worsens or does not improve.    - predniSONE (DELTASONE) 10 MG tablet; 6 po for 2 days and then 5 po for 2 days and then 4 po for 2 days and 3 po for 2 days and then 2 po for 2 days and then 1 po for 2 days.  Dispense: 42 tablet; Refill:  0  3. Bronchitis Will treat with antibiotic in light of steroids and fever. Unclear if bacterial or viral. Will hold off on CXR. Call if worsens or does not improve.  - azithromycin (ZITHROMAX) 250 MG tablet; 2 today and then one a day for 4 more days.  Dispense: 6 tablet; Refill: 0  [name], MD

## 2015-10-02 ENCOUNTER — Other Ambulatory Visit: Payer: Self-pay | Admitting: Family Medicine

## 2015-10-02 LAB — POCT INFLUENZA A/B
INFLUENZA A, POC: NEGATIVE
Influenza B, POC: NEGATIVE

## 2015-10-04 ENCOUNTER — Other Ambulatory Visit: Payer: Self-pay | Admitting: Family Medicine

## 2015-10-04 DIAGNOSIS — I10 Essential (primary) hypertension: Secondary | ICD-10-CM

## 2015-10-06 ENCOUNTER — Other Ambulatory Visit: Payer: Self-pay

## 2015-10-06 DIAGNOSIS — I10 Essential (primary) hypertension: Secondary | ICD-10-CM

## 2015-10-06 MED ORDER — HYDROCHLOROTHIAZIDE 12.5 MG PO TABS
12.5000 mg | ORAL_TABLET | Freq: Every day | ORAL | Status: DC
Start: 2015-10-06 — End: 2016-03-27

## 2015-10-28 ENCOUNTER — Encounter: Payer: Self-pay | Admitting: Family Medicine

## 2015-10-28 ENCOUNTER — Ambulatory Visit (INDEPENDENT_AMBULATORY_CARE_PROVIDER_SITE_OTHER): Payer: Medicare Other | Admitting: Family Medicine

## 2015-10-28 VITALS — BP 158/74 | HR 105 | Temp 99.4°F | Resp 16

## 2015-10-28 DIAGNOSIS — R059 Cough, unspecified: Secondary | ICD-10-CM

## 2015-10-28 DIAGNOSIS — R05 Cough: Secondary | ICD-10-CM | POA: Diagnosis not present

## 2015-10-28 DIAGNOSIS — J45901 Unspecified asthma with (acute) exacerbation: Secondary | ICD-10-CM

## 2015-10-28 MED ORDER — FLUTICASONE-SALMETEROL 250-50 MCG/DOSE IN AEPB: 1.0000 | INHALATION_SPRAY | Freq: Two times a day (BID) | RESPIRATORY_TRACT | Status: DC

## 2015-10-28 MED ORDER — ALBUTEROL SULFATE HFA 108 (90 BASE) MCG/ACT IN AERS: 2.0000 | INHALATION_SPRAY | Freq: Four times a day (QID) | RESPIRATORY_TRACT | Status: DC | PRN

## 2015-10-28 MED ORDER — HYDROCODONE-HOMATROPINE 5-1.5 MG/5ML PO SYRP: 5.0000 mL | ORAL_SOLUTION | Freq: Three times a day (TID) | ORAL | Status: DC | PRN

## 2015-10-28 MED ORDER — LEVALBUTEROL HCL 1.25 MG/0.5ML IN NEBU: 1.2500 mg | INHALATION_SOLUTION | Freq: Once | RESPIRATORY_TRACT | Status: DC

## 2015-10-28 MED ORDER — DOXYCYCLINE HYCLATE 100 MG PO TABS: 100.0000 mg | ORAL_TABLET | Freq: Two times a day (BID) | ORAL | Status: DC

## 2015-10-28 NOTE — Progress Notes (Signed)
Patient ID: Jennifer Mcgee, female   DOB: [birth year], 74 y.o.   MRN: [id number] Name: GHIANNA DEVOL   MRN: [id number]    DOB: [birth year]   Date:10/28/2015       Progress Note  Subjective  Chief Complaint  Chief Complaint  Patient presents with  . Fever  . Cough    Fever  This is a recurrent problem. Episode onset: 3 days ago. The problem occurs constantly. The maximum temperature noted was 100 to 100.9 F. The temperature was taken using an oral thermometer. Associated symptoms include congestion, coughing and headaches. Pertinent negatives include no ear pain, nausea, sore throat or vomiting. Associated symptoms comments: Dull headache with some muscle aches. Post nasal drip. No sore throat, earache or rhinorrhea..  Cough This is a recurrent problem. The current episode started in the past 7 days. The problem occurs constantly. The cough is non-productive. Associated symptoms include a fever and headaches. Pertinent negatives include no ear pain or sore throat. Nothing aggravates the symptoms. She has tried OTC cough suppressant and a beta-agonist inhaler for the symptoms.   Patient Active Problem List   Diagnosis Date Noted  . Bronchitis 09/30/2015  . Adaptation reaction 04/03/2015  . Absolute anemia 04/03/2015  . At risk of disease 04/03/2015  . 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 Back pain, chronic 04/03/2015  . Colon polyp 04/03/2015  . Diabetes ([place]) 04/03/2015  . 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 Gout 04/03/2015  . Blood in the urine 04/03/2015  . Calcium blood increased 04/03/2015  . Hypercholesteremia 04/03/2015  . High sodium levels 04/03/2015  . BP (high blood pressure) 04/03/2015  . 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 Arthritis, degenerative 04/03/2015  . RAD (reactive airway disease) 04/03/2015  . Diverticulitis of colon without hemorrhage 04/02/2013  . Personal history of colonic polyps 03/09/2013    Past Surgical History  Procedure Laterality Date  .  Colonoscopy  2014  . Back surgery    . Abdominal hysterectomy  1981  . Breast surgery      biopsy- benign  . Breast surgery  1977    duct operation  . Appendectomy  07/2004    Dr. Jamal Collin  . Eye surgery Bilateral 2013    cataract  . Excision / biopsy breast / nipple / duct Left 01/20/1973    milk duct removed   Family History  Problem Relation Age of Onset  . Cancer Mother 26    breast   . Hypertension Mother   . Heart disease Mother   . Hyperlipidemia Mother   . COPD Mother   . Hypertension Father   . Dementia Father   . Diabetes Paternal Grandmother   . Heart attack Paternal Grandfather    Past Medical History  Diagnosis Date  . Hypertension   . Diabetes mellitus without complication ([place])   . Hyperlipidemia   . Macular degeneration   . Colon polyp   . Anemia    Social History  Substance Use Topics  . Smoking status: Never Smoker   . Smokeless tobacco: Never Used  . Alcohol Use: Yes     Comment: rare    Current outpatient prescriptions:  .  ACCU-CHEK SMARTVIEW test strip, CHECK DAILY, Disp: , Rfl: 3 .  albuterol (PROVENTIL HFA;VENTOLIN HFA) 108 (90 BASE) MCG/ACT inhaler, Inhale into the lungs., Disp: , Rfl:  .  aspirin 81 MG tablet, Take by mouth., Disp: , Rfl:  .  atorvastatin (LIPITOR) 20 MG tablet, Take 1 tablet (20 mg total) by  mouth at bedtime., Disp: 90 tablet, Rfl: 3 .  Cholecalciferol (VITAMIN D3) 2000 UNITS TABS, Take by mouth., Disp: , Rfl:  .  Cyanocobalamin (VITAMIN B 12 PO), Take 1,000 mg by mouth daily., Disp: , Rfl:  .  ferrous sulfate 325 (65 FE) MG tablet, Take by mouth., Disp: , Rfl:  .  GLIPIZIDE XL 2.5 MG 24 hr tablet, TAKE 1 TABLET EVERY DAY, Disp: 90 tablet, Rfl: 3 .  hydrochlorothiazide (HYDRODIURIL) 12.5 MG tablet, Take 1 tablet (12.5 mg total) by mouth daily., Disp: 90 tablet, Rfl: 1 .  HYDROcodone-homatropine (HYCODAN) 5-1.5 MG/5ML syrup, Take 5 mLs by mouth every 8 (eight) hours as needed for cough., Disp: 120 mL, Rfl: 0 .  MAGNESIUM  GLUCONATE PO, Take by mouth., Disp: , Rfl:  .  metFORMIN (GLUCOPHAGE) 1000 MG tablet, Take 1 tablet (1,000 mg total) by mouth 2 (two) times daily with a meal., Disp: 180 tablet, Rfl: 3 .  Multiple Vitamins-Minerals (PRESERVISION/LUTEIN) CAPS, Take by mouth., Disp: , Rfl:  .  naproxen (NAPROSYN) 500 MG tablet, Take 1 tablet (500 mg total) by mouth 2 (two) times daily with a meal., Disp: 30 tablet, Rfl: 0 .  quinapril (ACCUPRIL) 20 MG tablet, TAKE 1 TABLET DAILY BY MOUTH, Disp: 90 tablet, Rfl: 1  Allergies  Allergen Reactions  . Epinephrine Other (See Comments)    Feels jumpy Jerking movements all over the body.   . Penicillins Itching  . Levaquin [Levofloxacin In D5w] Anxiety   Review of Systems  Constitutional: Positive for fever.  HENT: Positive for congestion. Negative for ear pain and sore throat.   Eyes: Negative.   Respiratory: Positive for cough.   Cardiovascular: Negative.   Gastrointestinal: Negative.  Negative for nausea and vomiting.  Genitourinary: Negative.   Musculoskeletal: Negative.   Skin: Negative.   Neurological: Positive for headaches.  Endo/Heme/Allergies: Negative.   Psychiatric/Behavioral: Negative.    Objective  Filed Vitals:   10/28/15 1017  BP: 158/74  Pulse: 105  Temp: 99.4 F (37.4 C)  TempSrc: Oral  Resp: 16  SpO2: 93%   Physical Exam  Constitutional: She is oriented to person, place, and time and well-developed, well-nourished, and in no distress.  HENT:  Head: Normocephalic.  Right Ear: External ear normal.  Left Ear: External ear normal.  Hazy TM's with questionable cloudy fluid. Not red.   Eyes: Conjunctivae and EOM are normal.  Neck: Normal range of motion. Neck supple.  Cardiovascular: Regular rhythm.   Slightly tachycardic.  Pulmonary/Chest: She has wheezes.  Rhonchi.  Lymphadenopathy:    She has no cervical adenopathy.  Neurological: She is alert and oriented to person, place, and time.  Psychiatric: Affect and judgment  normal.    Recent Results (from the past 2160 hour(s))  POCT Influenza A/B     Status: Normal   Collection Time: 10/02/15  1:17 PM  Result Value Ref Range   Influenza A, POC Negative Negative   Influenza B, POC Negative Negative   Assessment & Plan  1. Asthmatic bronchitis, unspecified asthma severity, with acute exacerbation Onset ober the past 3-4 days with cough and fever. Wheezing today mild. Low grade fever at the present without dyspnea. Will treat with bronchodilator, antibiotic and LABA with ICS. Increase fluid intake and recheck if no better in 4-5 days. If wheezing worsens or is uncontrolled, may need to return to the office for nebulizer treatment or go to the ER. - albuterol (PROVENTIL HFA;VENTOLIN HFA) 108 (90 BASE) MCG/ACT inhaler; Inhale 2 puffs into  the lungs every 6 (six) hours as needed for wheezing or shortness of breath.  Dispense: 1 Inhaler; Refill: 3 - doxycycline (VIBRA-TABS) 100 MG tablet; Take 1 tablet (100 mg total) by mouth 2 (two) times daily.  Dispense: 20 tablet; Refill: 0 - Fluticasone-Salmeterol (ADVAIR DISKUS) 250-50 MCG/DOSE AEPB; Inhale 1 puff into the lungs 2 (two) times daily.  Dispense: 1 each; Refill: 0  2. Cough Will give cough suppressant to use primarily at night to allow her rest better. - HYDROcodone-homatropine (HYCODAN) 5-1.5 MG/5ML syrup; Take 5 mLs by mouth every 8 (eight) hours as needed for cough.  Dispense: 120 mL; Refill: 0

## 2015-11-05 ENCOUNTER — Ambulatory Visit (INDEPENDENT_AMBULATORY_CARE_PROVIDER_SITE_OTHER): Payer: Medicare Other | Admitting: Family Medicine

## 2015-11-05 ENCOUNTER — Encounter: Payer: Self-pay | Admitting: Family Medicine

## 2015-11-05 VITALS — BP 132/80 | HR 88 | Temp 98.1°F | Resp 16 | Ht 62.5 in | Wt 206.0 lb

## 2015-11-05 DIAGNOSIS — Z0000 Encounter for general adult medical examination without abnormal findings: Secondary | ICD-10-CM | POA: Diagnosis not present

## 2015-11-05 DIAGNOSIS — E119 Type 2 diabetes mellitus without complications: Secondary | ICD-10-CM | POA: Diagnosis not present

## 2015-11-05 DIAGNOSIS — Z13820 Encounter for screening for osteoporosis: Secondary | ICD-10-CM | POA: Diagnosis not present

## 2015-11-05 NOTE — Progress Notes (Signed)
Patient ID: Jennifer Mcgee, female   DOB: 09/02/1941, 74 y.o.   MRN: [id number]         Patient: Jennifer Mcgee, Female    DOB: 08-08-1941, 74 y.o.   MRN: [id number] Visit Date: 11/05/2015  Today's Provider: [name], MD   Chief Complaint  Patient presents with  . Annual Exam   Subjective:    Annual wellness visit Jennifer Mcgee is a 74 y.o. female. She feels fairly well. She reports exercising regularly. She reports she is sleeping well. Chronic problems stable. Does need blood sugar checked. Still with cough from infection. Is improving. Will call if does not improve.   -----------------------------------------------------------   Review of Systems  Constitutional: Positive for fatigue. Negative for fever, chills, diaphoresis, activity change, appetite change and unexpected weight change.  HENT: Positive for congestion. Negative for dental problem, drooling, ear discharge, ear pain, facial swelling, hearing loss, mouth sores, nosebleeds, postnasal drip, rhinorrhea, sinus pressure, sneezing, sore throat, tinnitus, trouble swallowing and voice change.   Eyes: Positive for visual disturbance. Negative for photophobia, pain, discharge, redness and itching.  Respiratory: Positive for cough. Negative for apnea, choking, chest tightness, shortness of breath, wheezing and stridor.   Cardiovascular: Negative for chest pain, palpitations and leg swelling.  Gastrointestinal: Negative.   Endocrine: Negative.   Genitourinary: Negative.   Musculoskeletal: Positive for back pain. Negative for myalgias, joint swelling, arthralgias, gait problem, neck pain and neck stiffness.  Skin: Negative.   Allergic/Immunologic: Negative.   Neurological: Negative.   Hematological: Negative.   Psychiatric/Behavioral: Negative.     Social History   Social History  . Marital Status: Married    Spouse Name: N/A  . Number of Children: N/A  . Years of Education: N/A   Occupational History  .  Not on file.   Social History Main Topics  . Smoking status: Never Smoker   . Smokeless tobacco: Never Used  . Alcohol Use: Yes     Comment: rare  . Drug Use: No  . Sexual Activity: Not on file   Other Topics Concern  . Not on file   Social History Narrative    Patient Active Problem List   Diagnosis Date Noted  . Bronchitis 09/30/2015  . Adaptation reaction 04/03/2015  . Absolute anemia 04/03/2015  . At risk of disease 04/03/2015  . 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 Back pain, chronic 04/03/2015  . Colon polyp 04/03/2015  . Diabetes ([place]) 04/03/2015  . 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 Gout 04/03/2015  . Blood in the urine 04/03/2015  . Calcium blood increased 04/03/2015  . Hypercholesteremia 04/03/2015  . High sodium levels 04/03/2015  . BP (high blood pressure) 04/03/2015  . 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 Arthritis, degenerative 04/03/2015  . RAD (reactive airway disease) 04/03/2015  . Diverticulitis of colon without hemorrhage 04/02/2013  . Personal history of colonic polyps 03/09/2013    Past Surgical History  Procedure Laterality Date  . Colonoscopy  2014  . Back surgery    . Abdominal hysterectomy  1981  . Breast surgery      biopsy- benign  . Breast surgery  1977    duct operation  . Appendectomy  07/2004    Dr. Jamal Collin  . Eye surgery Bilateral 2013    cataract  . Excision / biopsy breast / nipple / duct Left 01/20/1973    milk duct removed    Her family history includes COPD in her mother; Cancer (age of onset: 37) in her mother;  Dementia in her father; Diabetes in her paternal grandmother; Heart attack in her paternal grandfather; Heart disease in her mother; Hyperlipidemia in her mother; Hypertension in her father and mother.    Previous Medications   ACCU-CHEK SMARTVIEW TEST STRIP    CHECK DAILY   ALBUTEROL (PROVENTIL HFA;VENTOLIN HFA) 108 (90 BASE) MCG/ACT INHALER    Inhale 2 puffs into the lungs every 6 (six) hours as needed for  wheezing or shortness of breath.   ASPIRIN 81 MG TABLET    Take by mouth.   ATORVASTATIN (LIPITOR) 20 MG TABLET    Take 1 tablet (20 mg total) by mouth at bedtime.   CHOLECALCIFEROL (VITAMIN D3) 2000 UNITS TABS    Take by mouth.   CYANOCOBALAMIN (VITAMIN B 12 PO)    Take 1,000 mg by mouth daily.   DOXYCYCLINE (VIBRA-TABS) 100 MG TABLET    Take 1 tablet (100 mg total) by mouth 2 (two) times daily.   FERROUS SULFATE 325 (65 FE) MG TABLET    Take by mouth.   FLUTICASONE-SALMETEROL (ADVAIR DISKUS) 250-50 MCG/DOSE AEPB    Inhale 1 puff into the lungs 2 (two) times daily.   GLIPIZIDE XL 2.5 MG 24 HR TABLET    TAKE 1 TABLET EVERY DAY   HYDROCHLOROTHIAZIDE (HYDRODIURIL) 12.5 MG TABLET    Take 1 tablet (12.5 mg total) by mouth daily.   HYDROCODONE-HOMATROPINE (HYCODAN) 5-1.5 MG/5ML SYRUP    Take 5 mLs by mouth every 8 (eight) hours as needed for cough.   MAGNESIUM GLUCONATE PO    Take by mouth.   METFORMIN (GLUCOPHAGE) 1000 MG TABLET    Take 1 tablet (1,000 mg total) by mouth 2 (two) times daily with a meal.   MULTIPLE VITAMINS-MINERALS (PRESERVISION/LUTEIN) CAPS    Take by mouth.   NAPROXEN (NAPROSYN) 500 MG TABLET    Take 1 tablet (500 mg total) by mouth 2 (two) times daily with a meal.   QUINAPRIL (ACCUPRIL) 20 MG TABLET    TAKE 1 TABLET DAILY BY MOUTH    Patient Care Team: [name], MD as PCP - General (Family Medicine)     Objective:   Vitals: BP 132/80 mmHg  Pulse 88  Temp(Src) 98.1 F (36.7 C) (Oral)  Resp 16  Ht 5' 2.5" (1.588 m)  Wt 206 lb (93.441 kg)  BMI 37.05 kg/m2  Physical Exam  Constitutional: She appears well-developed and well-nourished. She is active.  HENT:  Head: Normocephalic and atraumatic.  Right Ear: Tympanic membrane, external ear and ear canal normal.  Left Ear: Tympanic membrane, external ear and ear canal normal.  Nose: Nose normal.  Mouth/Throat: Uvula is midline, oropharynx is clear and moist and mucous membranes are normal.  Eyes: EOM and lids are  normal. Lids are everted and swept, no foreign bodies found.  Neck: Trachea normal. Carotid bruit is not present.  Cardiovascular: Normal rate and regular rhythm.   Pulmonary/Chest: Effort normal and breath sounds normal.  Abdominal: Soft. Normal appearance, normal aorta and bowel sounds are normal.  Musculoskeletal: Normal range of motion.  Psychiatric: She has a normal mood and affect. Her speech is normal and behavior is normal. Judgment and thought content normal. Cognition and memory are normal.    Activities of Daily Living In your present state of health, do you have any difficulty performing the following activities: 11/05/2015  Hearing? N  Vision? N  Difficulty concentrating or making decisions? N  Walking or climbing stairs? N  Dressing or bathing? N  Doing errands,  shopping? N    Fall Risk Assessment Fall Risk  11/05/2015 06/04/2015  Falls in the past year? No No     Depression Screen PHQ 2/9 Scores 11/05/2015 06/04/2015  PHQ - 2 Score 0 0    Cognitive Testing - 6-CIT  Correct? Score   What year is it? yes 0 0 or 4  What month is it? yes 0 0 or 3  Memorize:    Pia Mau,  42,  [address],  [place],      What time is it? (within 1 hour) yes 0 0 or 3  Count backwards from 20 yes 0 0, 2, or 4  Name the months of the year yes 0 0, 2, or 4  Repeat name & address above yes 0 0, 2, 4, 6, 8, or 10       TOTAL SCORE  0/28   Interpretation:  Normal  Normal (0-7) Abnormal (8-28)       Assessment & Plan:     Annual Wellness Visit  Reviewed patient's Family Medical History Reviewed and updated list of patient's medical providers Assessment of cognitive impairment was done Assessed patient's functional ability Established a written schedule for health screening [place] Completed and Reviewed  Exercise Activities and Dietary recommendations Goals    None      Immunization History  Administered Date(s) Administered  .  Influenza-Unspecified 10/21/2015  . Pneumococcal Conjugate-13 03/22/2014  . Tdap 10/25/2014  . Zoster 11/01/2011    Health Maintenance  Topic Date Due  . FOOT EXAM  11/04/1951  . OPHTHALMOLOGY EXAM  11/04/1951  . URINE MICROALBUMIN  11/04/1951  . PNA vac Low Risk Adult (2 of 2 - PPSV23) 03/23/2015  . HEMOGLOBIN A1C  12/04/2015  . INFLUENZA VACCINE  07/06/2016  . MAMMOGRAM  04/20/2017  . COLONOSCOPY  03/29/2023  . TETANUS/TDAP  10/25/2024  . DEXA SCAN  Completed  . ZOSTAVAX  Completed      Discussed health benefits of physical activity, and encouraged her to engage in regular exercise appropriate for her age and condition.   1. Medicare annual wellness visit, subsequent As above.   2. Type 2 diabetes mellitus without complication, without long-term current use of insulin ([place]) Will check labs.  - Hemoglobin A1c  3. Osteoporosis screening Will order.  - DG Bone Density   Patient was seen and examined by Jerrell Belfast, MD, and note scribed by Ashley Royalty, CMA.  I have reviewed the document for accuracy and completeness and I agree with above. Jerrell Belfast, MD   [name], MD    ------------------------------------------------------------------------------------------------------------

## 2015-11-06 LAB — HEMOGLOBIN A1C
ESTIMATED AVERAGE GLUCOSE: 166 mg/dL
HEMOGLOBIN A1C: 7.4 % — AB (ref 4.8–5.6)

## 2015-11-07 ENCOUNTER — Telehealth: Payer: Self-pay

## 2015-11-07 NOTE — Telephone Encounter (Signed)
-----   Message from [name], MD sent at 11/06/2015  8:10 AM EST ----- Blood sugar not as good as previous.   Please see if patient thinks she can decrease with lifestyle changes or would like to adjust medication. Recheck in 3 months.  Thanks.

## 2015-11-07 NOTE — Telephone Encounter (Signed)
Pt advised.  She says she is going to work on lifestyle changes first.   Thanks,   -Mickel Baas

## 2015-11-13 ENCOUNTER — Telehealth: Payer: Self-pay

## 2015-11-13 ENCOUNTER — Ambulatory Visit
Admission: RE | Admit: 2015-11-13 | Discharge: 2015-11-13 | Disposition: A | Discharge status: Home / Self Care | Payer: Medicare Other | Source: Ambulatory Visit | Attending: Family Medicine | Admitting: Family Medicine

## 2015-11-13 DIAGNOSIS — Z13820 Encounter for screening for osteoporosis: Secondary | ICD-10-CM | POA: Diagnosis present

## 2015-11-13 NOTE — Telephone Encounter (Signed)
-----   Message from [name], MD sent at 11/13/2015  1:23 PM EST ----- Normal bone density. Repeat in 3 years.  Thanks.

## 2015-11-13 NOTE — Telephone Encounter (Signed)
Informed pt as below. Jennifer Mcgee, CMA  

## 2015-11-28 ENCOUNTER — Encounter: Payer: Medicare Other | Admitting: Family Medicine

## 2015-12-19 ENCOUNTER — Other Ambulatory Visit: Payer: Self-pay | Admitting: Family Medicine

## 2015-12-19 DIAGNOSIS — E119 Type 2 diabetes mellitus without complications: Secondary | ICD-10-CM

## 2016-01-07 ENCOUNTER — Ambulatory Visit (INDEPENDENT_AMBULATORY_CARE_PROVIDER_SITE_OTHER): Payer: Medicare Other | Admitting: Family Medicine

## 2016-01-07 ENCOUNTER — Encounter: Payer: Self-pay | Admitting: Family Medicine

## 2016-01-07 VITALS — BP 116/74 | HR 88 | Temp 98.6°F | Resp 16 | Wt 206.0 lb

## 2016-01-07 DIAGNOSIS — H6992 Unspecified Eustachian tube disorder, left ear: Secondary | ICD-10-CM | POA: Diagnosis not present

## 2016-01-07 MED ORDER — FLUTICASONE PROPIONATE 50 MCG/ACT NA SUSP: 2.0000 | Freq: Every day | NASAL | Status: DC

## 2016-01-07 NOTE — Progress Notes (Signed)
Patient ID: Jennifer Mcgee, female   DOB: Nov 04, 1941, 75 y.o.   MRN: [id number]        Patient: Jennifer Mcgee Female    DOB: 10-04-1941   75 y.o.   MRN: [id number] Visit Date: 01/07/2016  Today's Provider: [name], MD   Chief Complaint  Patient presents with  . Ear Pain   Subjective:    Otalgia  There is pain in the left ear. This is a new problem. The current episode started in the past 7 days. The problem has been gradually worsening. There has been no fever. Associated symptoms include rhinorrhea and a sore throat. Pertinent negatives include no coughing, ear discharge, headaches or hearing loss. She has tried nothing for the symptoms.       Allergies  Allergen Reactions  . Epinephrine Other (See Comments)    Feels jumpy Jerking movements all over the body.   . Penicillins Itching  . Levaquin [Levofloxacin In D5w] Anxiety   Previous Medications   ACCU-CHEK FASTCLIX LANCETS MISC    USE DAILY   ALBUTEROL (PROVENTIL HFA;VENTOLIN HFA) 108 (90 BASE) MCG/ACT INHALER    Inhale 2 puffs into the lungs every 6 (six) hours as needed for wheezing or shortness of breath.   ASPIRIN 81 MG TABLET    Take by mouth.   ATORVASTATIN (LIPITOR) 20 MG TABLET    Take 1 tablet (20 mg total) by mouth at bedtime.   CHOLECALCIFEROL (VITAMIN D3) 2000 UNITS TABS    Take by mouth.   CYANOCOBALAMIN (VITAMIN B 12 PO)    Take 1,000 mg by mouth daily.   FERROUS SULFATE 325 (65 FE) MG TABLET    Take by mouth.   FLUTICASONE-SALMETEROL (ADVAIR DISKUS) 250-50 MCG/DOSE AEPB    Inhale 1 puff into the lungs 2 (two) times daily.   GLIPIZIDE XL 2.5 MG 24 HR TABLET    TAKE 1 TABLET EVERY DAY   GLUCOSE BLOOD (ACCU-CHEK SMARTVIEW) TEST STRIP    To check blood sugar once a day.  DX: E11.9   HYDROCHLOROTHIAZIDE (HYDRODIURIL) 12.5 MG TABLET    Take 1 tablet (12.5 mg total) by mouth daily.   HYDROCODONE-HOMATROPINE (HYCODAN) 5-1.5 MG/5ML SYRUP    Take 5 mLs by mouth every 8 (eight) hours as needed for cough.   MAGNESIUM GLUCONATE PO    Take by mouth.   METFORMIN (GLUCOPHAGE) 1000 MG TABLET    Take 1 tablet (1,000 mg total) by mouth 2 (two) times daily with a meal.   MULTIPLE VITAMINS-MINERALS (PRESERVISION/LUTEIN) CAPS    Take by mouth.   NAPROXEN (NAPROSYN) 500 MG TABLET    Take 1 tablet (500 mg total) by mouth 2 (two) times daily with a meal.   QUINAPRIL (ACCUPRIL) 20 MG TABLET    TAKE 1 TABLET DAILY BY MOUTH    Review of Systems  Constitutional: Negative for fever, chills, diaphoresis, activity change, appetite change, fatigue and unexpected weight change.  HENT: Positive for ear pain, rhinorrhea and sore throat. Negative for congestion, ear discharge, facial swelling, hearing loss, mouth sores, nosebleeds, postnasal drip, sinus pressure, sneezing, tinnitus, trouble swallowing and voice change.   Eyes: Negative for photophobia, pain, discharge, redness, itching and visual disturbance.  Respiratory: Negative for apnea, cough, choking, chest tightness, shortness of breath, wheezing and stridor.   Cardiovascular: Negative.   Gastrointestinal: Negative.   Neurological: Negative for dizziness, light-headedness and headaches.    Social History  Substance Use Topics  . Smoking status: Never Smoker   . Smokeless  tobacco: Never Used  . Alcohol Use: Yes     Comment: rare   Objective:   BP 116/74 mmHg  Pulse 88  Temp(Src) 98.6 F (37 C) (Oral)  Resp 16  Wt 206 lb (93.441 kg)  Physical Exam  Constitutional: She is oriented to person, place, and time. She appears well-developed and well-nourished.  HENT:  Head: Normocephalic and atraumatic.  Right Ear: Tympanic membrane, external ear and ear canal normal.  Left Ear: Tympanic membrane, external ear and ear canal normal.  Nose: Mucosal edema present.  Neurological: She is alert and oriented to person, place, and time.  Psychiatric: She has a normal mood and affect. Her behavior is normal. Judgment and thought content normal.          Assessment & Plan:      1. ET (eustachian tube disorder), left  New problem. Will treat with Flonase as below.  Advised pt to call if worsening or not improved.   - fluticasone (FLONASE) 50 MCG/ACT nasal spray; Place 2 sprays into both nostrils daily.  Dispense: 16 g; Refill: 5      Patient was seen and examined by Jerrell Belfast, MD, and note scribed by Ashley Royalty, CMA.  I have reviewed the document for accuracy and completeness and I agree with above. - Jerrell Belfast, MD   [name], MD  [facility]

## 2016-01-20 ENCOUNTER — Encounter: Payer: Self-pay | Admitting: *Deleted

## 2016-02-11 DIAGNOSIS — C4492 Squamous cell carcinoma of skin, unspecified: Secondary | ICD-10-CM

## 2016-02-11 HISTORY — DX: Squamous cell carcinoma of skin, unspecified: C44.92

## 2016-02-17 ENCOUNTER — Other Ambulatory Visit: Payer: Self-pay | Admitting: Family Medicine

## 2016-02-17 DIAGNOSIS — E119 Type 2 diabetes mellitus without complications: Secondary | ICD-10-CM

## 2016-03-27 ENCOUNTER — Other Ambulatory Visit: Payer: Self-pay | Admitting: Family Medicine

## 2016-03-27 DIAGNOSIS — I10 Essential (primary) hypertension: Secondary | ICD-10-CM

## 2016-04-05 ENCOUNTER — Encounter: Payer: Self-pay | Admitting: Family Medicine

## 2016-04-05 ENCOUNTER — Ambulatory Visit (INDEPENDENT_AMBULATORY_CARE_PROVIDER_SITE_OTHER): Payer: Medicare Other | Admitting: Family Medicine

## 2016-04-05 VITALS — BP 136/78 | HR 88 | Temp 98.9°F | Resp 16 | Wt 210.0 lb

## 2016-04-05 DIAGNOSIS — K047 Periapical abscess without sinus: Secondary | ICD-10-CM

## 2016-04-05 DIAGNOSIS — R6884 Jaw pain: Secondary | ICD-10-CM

## 2016-04-05 MED ORDER — CLINDAMYCIN HCL 300 MG PO CAPS: 300.0000 mg | ORAL_CAPSULE | Freq: Four times a day (QID) | ORAL | Status: DC

## 2016-04-05 NOTE — Progress Notes (Signed)
Patient ID: Jennifer Mcgee, female   DOB: [birth year], 75 y.o.   MRN: [id number]         Patient: Jennifer Mcgee Female    DOB: 06-09-1941   75 y.o.   MRN: [id number] Visit Date: 04/05/2016  Today's Provider: [name], MD   Chief Complaint  Patient presents with  . Jaw Pain   Subjective:    HPI   Pt comes in today with left sided jaw/ear pain.  She states it started Tuesday Night (03/30/2016) and at the time the pain was severe.  By the next morning she says the pain had improved but her jaw on the right side started "Popping" when she was chewing.  She describes the jaw pain now as just a "dull" pain. She now has some swelling in her face/neck.  Also has some left sided neck pain/stiffness.  She says her gums this morning are tender and bleeding some.  Pt is not sure if this is a dental issue.        Allergies  Allergen Reactions  . Epinephrine Other (See Comments)    Feels jumpy Jerking movements all over the body.   . Penicillins Itching  . Levaquin [Levofloxacin In D5w] Anxiety   Previous Medications   ACCU-CHEK FASTCLIX LANCETS MISC    USE DAILY   ALBUTEROL (PROVENTIL HFA;VENTOLIN HFA) 108 (90 BASE) MCG/ACT INHALER    Inhale 2 puffs into the lungs every 6 (six) hours as needed for wheezing or shortness of breath.   ASPIRIN 81 MG TABLET    Take by mouth.   ATORVASTATIN (LIPITOR) 20 MG TABLET    Take 1 tablet (20 mg total) by mouth at bedtime.   CHOLECALCIFEROL (VITAMIN D3) 2000 UNITS TABS    Take by mouth.   CYANOCOBALAMIN (VITAMIN B 12 PO)    Take 1,000 mg by mouth daily.   FERROUS SULFATE 325 (65 FE) MG TABLET    Take by mouth.   FLUTICASONE (FLONASE) 50 MCG/ACT NASAL SPRAY    Place 2 sprays into both nostrils daily.   FLUTICASONE-SALMETEROL (ADVAIR DISKUS) 250-50 MCG/DOSE AEPB    Inhale 1 puff into the lungs 2 (two) times daily.   GLIPIZIDE XL 2.5 MG 24 HR TABLET    TAKE 1 TABLET EVERY DAY   GLUCOSE BLOOD (ACCU-CHEK SMARTVIEW) TEST STRIP    To check blood sugar  once a day.  DX: E11.9   HYDROCHLOROTHIAZIDE (HYDRODIURIL) 12.5 MG TABLET    TAKE 1 TABLET (12.5 MG TOTAL) BY MOUTH DAILY.   HYDROCODONE-HOMATROPINE (HYCODAN) 5-1.5 MG/5ML SYRUP    Take 5 mLs by mouth every 8 (eight) hours as needed for cough.   MAGNESIUM GLUCONATE PO    Take by mouth.   METFORMIN (GLUCOPHAGE) 1000 MG TABLET    Take 1 tablet (1,000 mg total) by mouth 2 (two) times daily with a meal.   MULTIPLE VITAMINS-MINERALS (PRESERVISION/LUTEIN) CAPS    Take by mouth.   NAPROXEN (NAPROSYN) 500 MG TABLET    Take 1 tablet (500 mg total) by mouth 2 (two) times daily with a meal.   QUINAPRIL (ACCUPRIL) 20 MG TABLET    TAKE 1 TABLET DAILY BY MOUTH    Review of Systems  HENT: Positive for congestion, ear pain, facial swelling, postnasal drip and rhinorrhea. Negative for dental problem, drooling, nosebleeds, sinus pressure, sneezing, sore throat, tinnitus, trouble swallowing and voice change.   Respiratory: Negative.   Cardiovascular: Negative.   Gastrointestinal: Negative.   Neurological: Negative for dizziness, light-headedness  and headaches.    Social History  Substance Use Topics  . Smoking status: Never Smoker   . Smokeless tobacco: Never Used  . Alcohol Use: Yes     Comment: rare   Objective:   BP 136/78 mmHg  Pulse 88  Temp(Src) 98.9 F (37.2 C) (Oral)  Resp 16  Wt 210 lb (95.255 kg)  Physical Exam  Constitutional: She is oriented to person, place, and time. She appears well-developed and well-nourished.  HENT:  Head: Normocephalic and atraumatic.  Right Ear: Tympanic membrane, external ear and ear canal normal.  Left Ear: Tympanic membrane, external ear and ear canal normal.  Mouth/Throat: Oropharynx is clear and moist. No oropharyngeal exudate.  Left sided facial swelling noted.  Tender in submandibular area with mild swelling on left also.  Teeth with no tenderness to percussion. Some capped with receding gum line noted.    Neck: Normal range of motion. Neck supple.    Neurological: She is alert and oriented to person, place, and time.  Psychiatric: She has a normal mood and affect. Her behavior is normal. Judgment and thought content normal.      Assessment & Plan:     1. Jaw pain Treat as below.   2. Dental abscess New problem. Will treat as below.  Advised pt to call her dentist and make an appointment soon for definitive treatment.    - clindamycin (CLEOCIN) 300 MG capsule; Take 1 capsule (300 mg total) by mouth 4 (four) times daily.  Dispense: 40 capsule; Refill: 0  Patient was seen and examined by Jerrell Belfast, MD, and note scribed by Ashley Royalty, CMA.  I have reviewed the document for accuracy and completeness and I agree with above. - Jerrell Belfast, MD      [name], MD  [facility]

## 2016-04-06 ENCOUNTER — Telehealth: Payer: Self-pay | Admitting: Family Medicine

## 2016-04-06 NOTE — Telephone Encounter (Signed)
Pt was seen yesterday by her dentist and was advised she has a severely strained muscle from clinching her teeth.  Pt ask for a note to be sent back to Dr Venia Minks.  BP:[id number]

## 2016-04-06 NOTE — Telephone Encounter (Signed)
I spoke with Mrs. Belcourt she does not what a muscle relaxer right now.  She does have naproxen on hand in case she needs it.  Her dentist made a mouth guard for her at night, and she is going back to him Thursday. She will call back if she changes her mind.    Thanks,   -Mickel Baas

## 2016-04-06 NOTE — Telephone Encounter (Signed)
Oh my.  Please see if taking Naprosyn 500 mg bid and would like a muscle relaxer at night. Thanks.

## 2016-04-13 ENCOUNTER — Ambulatory Visit (INDEPENDENT_AMBULATORY_CARE_PROVIDER_SITE_OTHER): Payer: Medicare Other | Admitting: General Surgery

## 2016-04-13 ENCOUNTER — Encounter: Payer: Self-pay | Admitting: General Surgery

## 2016-04-13 VITALS — BP 170/90 | HR 80 | Resp 12 | Ht 62.0 in | Wt 208.0 lb

## 2016-04-13 DIAGNOSIS — Z86010 Personal history of colonic polyps: Secondary | ICD-10-CM | POA: Diagnosis not present

## 2016-04-13 MED ORDER — PEG 3350-KCL-NABCB-NACL-NASULF 236 G PO SOLR: 4000.0000 mL | Freq: Once | ORAL | Status: DC

## 2016-04-13 NOTE — Patient Instructions (Addendum)
Colonoscopy A colonoscopy is an exam to look at the entire large intestine (colon). This exam can help find problems such as tumors, polyps, inflammation, and areas of bleeding. The exam takes about 1 hour.  LET [facility] CARE PROVIDER KNOW ABOUT:   Any allergies you have.  All medicines you are taking, including vitamins, herbs, eye drops, creams, and over-the-counter medicines.  Previous problems you or members of your family have had with the use of anesthetics.  Any blood disorders you have.  Previous surgeries you have had.  Medical conditions you have. RISKS AND COMPLICATIONS  Generally, this is a safe procedure. However, as with any procedure, complications can occur. Possible complications include:  Bleeding.  Tearing or rupture of the colon wall.  Reaction to medicines given during the exam.  Infection (rare). BEFORE THE PROCEDURE   Ask your health care provider about changing or stopping your regular medicines.  You may be prescribed an oral bowel prep. This involves drinking a large amount of medicated liquid, starting the day before your procedure. The liquid will cause you to have multiple loose stools until your stool is almost clear or light green. This cleans out your colon in preparation for the procedure.  Do not eat or drink anything else once you have started the bowel prep, unless your health care provider tells you it is safe to do so.  Arrange for someone to drive you home after the procedure. PROCEDURE   You will be given medicine to help you relax (sedative).  You will lie on your side with your knees bent.  A long, flexible tube with a light and camera on the end (colonoscope) will be inserted through the rectum and into the colon. The camera sends video back to a computer screen as it moves through the colon. The colonoscope also releases carbon dioxide gas to inflate the colon. This helps your health care provider see the area better.  During  the exam, your health care provider may take a small tissue sample (biopsy) to be examined under a microscope if any abnormalities are found.  The exam is finished when the entire colon has been viewed. AFTER THE PROCEDURE   Do not drive for 24 hours after the exam.  You may have a small amount of blood in your stool.  You may pass moderate amounts of gas and have mild abdominal cramping or bloating. This is caused by the gas used to inflate your colon during the exam.  Ask when your test results will be ready and how you will get your results. Make sure you get your test results.   This information is not intended to replace advice given to you by your health care provider. Make sure you discuss any questions you have with your health care provider.   Document Released: 11/19/2000 Document Revised: 09/12/2013 Document Reviewed: 07/30/2013 Elsevier Interactive Patient Education [employer].  The patient is scheduled for a Colonoscopy at [facility] on 04/28/16. They are aware to call the day before to get their arrival time. Golytely prescription has been sent into the patient's pharmacy. She will hold her Metformin and Glipizide the day of prep and procedure. She will only take her blood pressure medications at 6 am the morning of her procedure. The patient is aware of date and instructions.

## 2016-04-13 NOTE — Progress Notes (Signed)
Patient ID: Jennifer Mcgee, female   DOB: [birth year], 75 y.o.   MRN: [id number]  Chief Complaint  Patient presents with  . Colonoscopy    HPI Jennifer Mcgee is a 75 y.o. female.  Who presents for a colonoscopy discussion. The last colonoscopy was completed in 2014. 3 polyps were removed but the prep was less than adequate. Denies any gastrointestinal issues. Bowels move regular and no bleeding noted. I have reviewed the history of present illness with the patient.  HPI  Past Medical History  Diagnosis Date  . Hypertension   . Diabetes mellitus without complication ([place])   . Hyperlipidemia   . Macular degeneration   . Colon polyp   . Anemia     Past Surgical History  Procedure Laterality Date  . Colonoscopy  03-28-13    Dr Jamal Collin  . Back surgery    . Abdominal hysterectomy  1981  . Breast surgery      biopsy- benign  . Breast surgery  1977    duct operation  . Appendectomy  07/2004    Dr. Jamal Collin  . Eye surgery Bilateral 2013    cataract  . Excision / biopsy breast / nipple / duct Left 01/20/1973    milk duct removed    Family History  Problem Relation Age of Onset  . Cancer Mother 47    breast   . Hypertension Mother   . Heart disease Mother   . Hyperlipidemia Mother   . COPD Mother   . Hypertension Father   . Dementia Father   . Diabetes Paternal Grandmother   . Heart attack Paternal Grandfather     Social History Social History  Substance Use Topics  . Smoking status: Never Smoker   . Smokeless tobacco: Never Used  . Alcohol Use: Yes     Comment: rare    Allergies  Allergen Reactions  . Epinephrine Other (See Comments)    Feels jumpy Jerking movements all over the body.   . Penicillins Itching  . Levaquin [Levofloxacin In D5w] Anxiety    Current Outpatient Prescriptions  Medication Sig Dispense Refill  . ACCU-CHEK FASTCLIX LANCETS MISC USE DAILY 102 each 3  . aspirin 81 MG tablet Take by mouth.    Marland Kitchen atorvastatin (LIPITOR) 20 MG tablet Take  1 tablet (20 mg total) by mouth at bedtime. 90 tablet 3  . Cholecalciferol (VITAMIN D3) 2000 UNITS TABS Take by mouth.    . Cyanocobalamin (VITAMIN B 12 PO) Take 1,000 mg by mouth daily.    . ferrous sulfate 325 (65 FE) MG tablet Take by mouth.    . Fluticasone-Salmeterol (ADVAIR DISKUS) 250-50 MCG/DOSE AEPB Inhale 1 puff into the lungs 2 (two) times daily. 1 each 0  . GLIPIZIDE XL 2.5 MG 24 hr tablet TAKE 1 TABLET EVERY DAY 90 tablet 3  . glucose blood (ACCU-CHEK SMARTVIEW) test strip To check blood sugar once a day.  DX: E11.9 100 each 1  . hydrochlorothiazide (HYDRODIURIL) 12.5 MG tablet TAKE 1 TABLET (12.5 MG TOTAL) BY MOUTH DAILY. 90 tablet 1  . MAGNESIUM GLUCONATE PO Take by mouth.    . metFORMIN (GLUCOPHAGE) 1000 MG tablet Take 1 tablet (1,000 mg total) by mouth 2 (two) times daily with a meal. 180 tablet 3  . Multiple Vitamins-Minerals (PRESERVISION/LUTEIN) CAPS Take by mouth.    . quinapril (ACCUPRIL) 20 MG tablet TAKE 1 TABLET DAILY BY MOUTH 90 tablet 1  . polyethylene glycol (GOLYTELY) 236 g solution Take 4,000 mLs by  mouth once. 4000 mL 0   No current facility-administered medications for this visit.    Review of Systems Review of Systems  Constitutional: Negative.   Respiratory: Negative.   Cardiovascular: Negative.   Gastrointestinal: Negative.     Blood pressure 170/90, pulse 80, resp. rate 12, height 5\' 2"  (1.575 m), weight 208 lb (94.348 kg).  Physical Exam Physical Exam  Constitutional: She is oriented to person, place, and time. She appears well-developed and well-nourished.  HENT:  Mouth/Throat: Oropharynx is clear and moist.  Eyes: Conjunctivae are normal. No scleral icterus.  Neck: Neck supple.  Cardiovascular: Normal rate, regular rhythm and normal heart sounds.   Pulmonary/Chest: Effort normal and breath sounds normal.  Abdominal: Soft. There is no tenderness.  Lymphadenopathy:    She has no cervical adenopathy.  Neurological: She is alert and oriented  to person, place, and time.  Skin: Skin is warm and dry.  Psychiatric: Her behavior is normal.    Data Reviewed Prior notes and colonoscopy.  Assessment    Last colonoscopy performed in 2014 showed 3 polyps. Prep was less than adequate. Colonoscopy also showed extensive diverticulosis.    Plan   Due to poor prep patient is to use half mirlax and half liquid prep. Colonoscopy with possible biopsy/polypectomy prn: Information regarding the procedure, including its potential risks and complications (including but not limited to perforation of the bowel, which may require emergency surgery to repair, and bleeding) was verbally given to the patient. Educational information regarding lower intestinal endoscopy was given to the patient. Written instructions for how to complete the bowel prep using Miralax were provided. The importance of drinking ample fluids to avoid dehydration as a result of the prep emphasized.   The patient is scheduled for a Colonoscopy at [facility] on 04/28/16. They are aware to call the day before to get their arrival time. Golytely prescription has been sent into the patient's pharmacy. She will hold her Metformin and Glipizide the day of prep and procedure. She will only take her blood pressure medications at 6 am the morning of her procedure. The patient is aware of date and instructions.     PCP:  Venia Minks This information has been scribed by Karie Fetch RN, BSN,BC.    SANKAR,SEEPLAPUTHUR G 04/13/2016, 1:18 PM

## 2016-04-15 ENCOUNTER — Encounter: Payer: Self-pay | Admitting: General Surgery

## 2016-04-28 ENCOUNTER — Encounter: Payer: Self-pay | Admitting: Anesthesiology

## 2016-04-28 ENCOUNTER — Ambulatory Visit
Admission: RE | Admit: 2016-04-28 | Discharge: 2016-04-28 | Disposition: A | Discharge status: Home / Self Care | Payer: Medicare Other | Source: Ambulatory Visit | Attending: General Surgery | Admitting: General Surgery

## 2016-04-28 ENCOUNTER — Encounter
Admission: RE | Disposition: A | Discharge status: Home / Self Care | Payer: Self-pay | Source: Ambulatory Visit | Attending: General Surgery

## 2016-04-28 ENCOUNTER — Ambulatory Visit: Payer: Medicare Other | Admitting: Registered Nurse

## 2016-04-28 DIAGNOSIS — K5730 Diverticulosis of large intestine without perforation or abscess without bleeding: Secondary | ICD-10-CM

## 2016-04-28 DIAGNOSIS — M109 Gout, unspecified: Secondary | ICD-10-CM | POA: Diagnosis not present

## 2016-04-28 DIAGNOSIS — Z79899 Other long term (current) drug therapy: Secondary | ICD-10-CM | POA: Insufficient documentation

## 2016-04-28 DIAGNOSIS — Z1211 Encounter for screening for malignant neoplasm of colon: Secondary | ICD-10-CM | POA: Insufficient documentation

## 2016-04-28 DIAGNOSIS — Z7984 Long term (current) use of oral hypoglycemic drugs: Secondary | ICD-10-CM | POA: Diagnosis not present

## 2016-04-28 DIAGNOSIS — E669 Obesity, unspecified: Secondary | ICD-10-CM | POA: Insufficient documentation

## 2016-04-28 DIAGNOSIS — Z880 Allergy status to penicillin: Secondary | ICD-10-CM | POA: Insufficient documentation

## 2016-04-28 DIAGNOSIS — I10 Essential (primary) hypertension: Secondary | ICD-10-CM | POA: Insufficient documentation

## 2016-04-28 DIAGNOSIS — Z7982 Long term (current) use of aspirin: Secondary | ICD-10-CM | POA: Diagnosis not present

## 2016-04-28 DIAGNOSIS — F419 Anxiety disorder, unspecified: Secondary | ICD-10-CM | POA: Insufficient documentation

## 2016-04-28 DIAGNOSIS — Z86010 Personal history of colonic polyps: Secondary | ICD-10-CM | POA: Diagnosis not present

## 2016-04-28 DIAGNOSIS — E785 Hyperlipidemia, unspecified: Secondary | ICD-10-CM | POA: Diagnosis not present

## 2016-04-28 DIAGNOSIS — M1990 Unspecified osteoarthritis, unspecified site: Secondary | ICD-10-CM | POA: Insufficient documentation

## 2016-04-28 DIAGNOSIS — Z862 Personal history of diseases of the blood and blood-forming organs and certain disorders involving the immune mechanism: Secondary | ICD-10-CM | POA: Insufficient documentation

## 2016-04-28 DIAGNOSIS — E119 Type 2 diabetes mellitus without complications: Secondary | ICD-10-CM | POA: Diagnosis not present

## 2016-04-28 DIAGNOSIS — H3530 Unspecified macular degeneration: Secondary | ICD-10-CM | POA: Diagnosis not present

## 2016-04-28 HISTORY — PX: COLONOSCOPY WITH PROPOFOL: SHX5780

## 2016-04-28 LAB — GLUCOSE, CAPILLARY: Glucose-Capillary: 142 mg/dL — ABNORMAL HIGH (ref 65–99)

## 2016-04-28 SURGERY — COLONOSCOPY WITH PROPOFOL
Anesthesia: General

## 2016-04-28 MED ORDER — LIDOCAINE HCL (CARDIAC) 20 MG/ML IV SOLN
INTRAVENOUS | Status: DC | PRN
  Administered 2016-04-28: 40 mg via INTRAVENOUS

## 2016-04-28 MED ORDER — PROPOFOL 10 MG/ML IV BOLUS
INTRAVENOUS | Status: DC | PRN
  Administered 2016-04-28: 50 mg via INTRAVENOUS

## 2016-04-28 MED ORDER — PROPOFOL 500 MG/50ML IV EMUL
INTRAVENOUS | Status: DC | PRN
  Administered 2016-04-28: 180 ug/kg/min via INTRAVENOUS

## 2016-04-28 MED ORDER — PHENYLEPHRINE HCL 10 MG/ML IJ SOLN
INTRAMUSCULAR | Status: DC | PRN
  Administered 2016-04-28: 100 ug via INTRAVENOUS
  Administered 2016-04-28: 200 ug via INTRAVENOUS
  Administered 2016-04-28: 100 ug via INTRAVENOUS

## 2016-04-28 MED ORDER — MIDAZOLAM HCL 2 MG/2ML IJ SOLN
INTRAMUSCULAR | Status: DC | PRN
  Administered 2016-04-28: 1 mg via INTRAVENOUS

## 2016-04-28 MED ORDER — FENTANYL CITRATE (PF) 100 MCG/2ML IJ SOLN
INTRAMUSCULAR | Status: DC | PRN
  Administered 2016-04-28: 50 ug via INTRAVENOUS

## 2016-04-28 MED ORDER — SODIUM CHLORIDE 0.9 % IV SOLN
INTRAVENOUS | Status: DC
  Administered 2016-04-28: 10:00:00 via INTRAVENOUS

## 2016-04-28 NOTE — Op Note (Signed)
[facility] Gastroenterology Patient Name: Jennifer Mcgee Procedure Date: 04/28/2016 9:52 AM MRN: [id number] Account #: [id number] Date of Birth: [birth year] Admit Type: Outpatient Age: 75 Room: [facility] ENDO ROOM 1 Gender: Female Note Status: Finalized Procedure:            Colonoscopy Indications:          High risk colon cancer surveillance: Personal history                        of colonic polyps Providers:            Seeplaputhur G. Jamal Collin, MD Referring MD:         Jerrell Belfast, MD (Referring MD) Medicines:            General Anesthesia Complications:        No immediate complications. Procedure:            Pre-Anesthesia Assessment:                       - General anesthesia under the supervision of an                        anesthesiologist was determined to be medically                        necessary for this procedure based on review of the                        patient's medical history, medications, and prior                        anesthesia history.                       After obtaining informed consent, the colonoscope was                        passed under direct vision. Throughout the procedure,                        the patient's blood pressure, pulse, and oxygen                        saturations were monitored continuously. The                        Colonoscope was introduced through the anus and                        advanced to the the cecum, identified by its                        appearance. The colonoscopy was performed without                        difficulty. The patient tolerated the procedure well.                        The quality of the bowel preparation was fair. Findings:      The perianal and digital rectal examinations were normal.      Multiple small and  large-mouthed diverticula were found in the entire       colon.      A large amount of semi-solid stool was found in the ascending colon,       making visualization  difficult. Lavage of the area was performed using a       large amount, resulting in incomplete clearance with fair visualization.      The exam was otherwise without abnormality. Impression:           - Preparation of the colon was fair.                       - Diverticulosis in the entire examined colon.                       - Stool in the ascending colon.                       - The examination was otherwise normal.                       - No specimens collected. Recommendation:       - Discharge patient to home.                       - Repeat colonoscopy in 5 years for surveillance. Procedure Code(s):    --- Professional ---                       [phone], Colonoscopy, flexible; diagnostic, including                        collection of specimen(s) by brushing or washing, when                        performed (separate procedure) Diagnosis Code(s):    --- Professional ---                       Z86.010, Personal history of colonic polyps                       K57.30, Diverticulosis of large intestine without                        perforation or abscess without bleeding CPT copyright 2016 American Medical Association. All rights reserved. The codes documented in this report are preliminary and upon coder review may  be revised to meet current compliance requirements. Christene Lye, MD 04/28/2016 10:24:37 AM This report has been signed electronically. Number of Addenda: 0 Note Initiated On: 04/28/2016 9:52 AM Scope Withdrawal Time: 0 hours 6 minutes 11 seconds  Total Procedure Duration: 0 hours 23 minutes 24 seconds       [facility]

## 2016-04-28 NOTE — Anesthesia Preprocedure Evaluation (Signed)
Anesthesia Evaluation  Patient identified by MRN, date of birth, ID band Patient awake    Reviewed: Allergy & Precautions, NPO status , Patient's Chart, lab work & pertinent test results, reviewed documented beta blocker date and time   Airway Mallampati: III  TM Distance: >3 FB     Dental  (+) Chipped   Pulmonary           Cardiovascular hypertension, Pt. on medications      Neuro/Psych PSYCHIATRIC DISORDERS Anxiety    GI/Hepatic   Endo/Other  diabetes, Type 2  Renal/GU      Musculoskeletal  (+) Arthritis ,   Abdominal   Peds  Hematology  (+) anemia ,   Anesthesia Other Findings Obese. Gout.  Reproductive/Obstetrics                             Anesthesia Physical Anesthesia Plan  ASA: III  Anesthesia Plan: General   Post-op Pain Management:    Induction: Intravenous  Airway Management Planned: Nasal Cannula  Additional Equipment:   Intra-op Plan:   Post-operative Plan:   Informed Consent: I have reviewed the patients History and Physical, chart, labs and discussed the procedure including the risks, benefits and alternatives for the proposed anesthesia with the patient or authorized representative who has indicated his/her understanding and acceptance.     Plan Discussed with: CRNA  Anesthesia Plan Comments:         Anesthesia Quick Evaluation

## 2016-04-28 NOTE — Anesthesia Procedure Notes (Signed)
Date/Time: 04/28/2016 9:53 AM Performed by: Doreen Salvage Pre-anesthesia Checklist: Patient identified, Emergency Drugs available, Suction available and Patient being monitored Patient Re-evaluated:Patient Re-evaluated prior to inductionOxygen Delivery Method: Nasal cannula Intubation Type: IV induction Dental Injury: Teeth and Oropharynx as per pre-operative assessment  Comments: Nasal cannula with etCO2 monitoring

## 2016-04-28 NOTE — H&P (View-Only) (Signed)
Patient ID: Jennifer Mcgee, female   DOB: Oct 09, 1941, 75 y.o.   MRN: [id number]  Chief Complaint  Patient presents with  . Colonoscopy    HPI Jennifer Mcgee is a 75 y.o. female.  Who presents for a colonoscopy discussion. The last colonoscopy was completed in 2014. 3 polyps were removed but the prep was less than adequate. Denies any gastrointestinal issues. Bowels move regular and no bleeding noted. I have reviewed the history of present illness with the patient.  HPI  Past Medical History  Diagnosis Date  . Hypertension   . Diabetes mellitus without complication ([place])   . Hyperlipidemia   . Macular degeneration   . Colon polyp   . Anemia     Past Surgical History  Procedure Laterality Date  . Colonoscopy  03-28-13    Dr Jamal Collin  . Back surgery    . Abdominal hysterectomy  1981  . Breast surgery      biopsy- benign  . Breast surgery  1977    duct operation  . Appendectomy  07/2004    Dr. Jamal Collin  . Eye surgery Bilateral 2013    cataract  . Excision / biopsy breast / nipple / duct Left 01/20/1973    milk duct removed    Family History  Problem Relation Age of Onset  . Cancer Mother 28    breast   . Hypertension Mother   . Heart disease Mother   . Hyperlipidemia Mother   . COPD Mother   . Hypertension Father   . Dementia Father   . Diabetes Paternal Grandmother   . Heart attack Paternal Grandfather     Social History Social History  Substance Use Topics  . Smoking status: Never Smoker   . Smokeless tobacco: Never Used  . Alcohol Use: Yes     Comment: rare    Allergies  Allergen Reactions  . Epinephrine Other (See Comments)    Feels jumpy Jerking movements all over the body.   . Penicillins Itching  . Levaquin [Levofloxacin In D5w] Anxiety    Current Outpatient Prescriptions  Medication Sig Dispense Refill  . ACCU-CHEK FASTCLIX LANCETS MISC USE DAILY 102 each 3  . aspirin 81 MG tablet Take by mouth.    Marland Kitchen atorvastatin (LIPITOR) 20 MG tablet Take  1 tablet (20 mg total) by mouth at bedtime. 90 tablet 3  . Cholecalciferol (VITAMIN D3) 2000 UNITS TABS Take by mouth.    . Cyanocobalamin (VITAMIN B 12 PO) Take 1,000 mg by mouth daily.    . ferrous sulfate 325 (65 FE) MG tablet Take by mouth.    . Fluticasone-Salmeterol (ADVAIR DISKUS) 250-50 MCG/DOSE AEPB Inhale 1 puff into the lungs 2 (two) times daily. 1 each 0  . GLIPIZIDE XL 2.5 MG 24 hr tablet TAKE 1 TABLET EVERY DAY 90 tablet 3  . glucose blood (ACCU-CHEK SMARTVIEW) test strip To check blood sugar once a day.  DX: E11.9 100 each 1  . hydrochlorothiazide (HYDRODIURIL) 12.5 MG tablet TAKE 1 TABLET (12.5 MG TOTAL) BY MOUTH DAILY. 90 tablet 1  . MAGNESIUM GLUCONATE PO Take by mouth.    . metFORMIN (GLUCOPHAGE) 1000 MG tablet Take 1 tablet (1,000 mg total) by mouth 2 (two) times daily with a meal. 180 tablet 3  . Multiple Vitamins-Minerals (PRESERVISION/LUTEIN) CAPS Take by mouth.    . quinapril (ACCUPRIL) 20 MG tablet TAKE 1 TABLET DAILY BY MOUTH 90 tablet 1  . polyethylene glycol (GOLYTELY) 236 g solution Take 4,000 mLs by  mouth once. 4000 mL 0   No current facility-administered medications for this visit.    Review of Systems Review of Systems  Constitutional: Negative.   Respiratory: Negative.   Cardiovascular: Negative.   Gastrointestinal: Negative.     Blood pressure 170/90, pulse 80, resp. rate 12, height 5\' 2"  (1.575 m), weight 208 lb (94.348 kg).  Physical Exam Physical Exam  Constitutional: She is oriented to person, place, and time. She appears well-developed and well-nourished.  HENT:  Mouth/Throat: Oropharynx is clear and moist.  Eyes: Conjunctivae are normal. No scleral icterus.  Neck: Neck supple.  Cardiovascular: Normal rate, regular rhythm and normal heart sounds.   Pulmonary/Chest: Effort normal and breath sounds normal.  Abdominal: Soft. There is no tenderness.  Lymphadenopathy:    She has no cervical adenopathy.  Neurological: She is alert and oriented  to person, place, and time.  Skin: Skin is warm and dry.  Psychiatric: Her behavior is normal.    Data Reviewed Prior notes and colonoscopy.  Assessment    Last colonoscopy performed in 2014 showed 3 polyps. Prep was less than adequate. Colonoscopy also showed extensive diverticulosis.    Plan   Due to poor prep patient is to use half mirlax and half liquid prep. Colonoscopy with possible biopsy/polypectomy prn: Information regarding the procedure, including its potential risks and complications (including but not limited to perforation of the bowel, which may require emergency surgery to repair, and bleeding) was verbally given to the patient. Educational information regarding lower intestinal endoscopy was given to the patient. Written instructions for how to complete the bowel prep using Miralax were provided. The importance of drinking ample fluids to avoid dehydration as a result of the prep emphasized.   The patient is scheduled for a Colonoscopy at [facility] on 04/28/16. They are aware to call the day before to get their arrival time. Golytely prescription has been sent into the patient's pharmacy. She will hold her Metformin and Glipizide the day of prep and procedure. She will only take her blood pressure medications at 6 am the morning of her procedure. The patient is aware of date and instructions.     PCP:  Venia Minks This information has been scribed by Karie Fetch RN, BSN,BC.    Orion Mole G 04/13/2016, 1:18 PM

## 2016-04-28 NOTE — Transfer of Care (Signed)
Immediate Anesthesia Transfer of Care Note  Patient: Jennifer Mcgee  Procedure(s) Performed: Procedure(s): COLONOSCOPY WITH PROPOFOL (N/A)  Patient Location: PACU and Endoscopy Unit  Anesthesia Type:General  Level of Consciousness: sedated  Airway & Oxygen Therapy: Patient Spontanous Breathing and Patient connected to nasal cannula oxygen  Post-op Assessment: Report given to RN and Post -op Vital signs reviewed and stable  Post vital signs: Reviewed and stable  Last Vitals:  Filed Vitals:   04/28/16 0930 04/28/16 1024  BP: 166/74 103/62  Pulse: 90 65  Temp: 36.4 C 36.2 C  Resp: 19 11    Complications: No apparent anesthesia complications

## 2016-04-28 NOTE — Interval H&P Note (Signed)
History and Physical Interval Note:  04/28/2016 9:50 AM  Jennifer Mcgee  has presented today for surgery, with the diagnosis of HX COLON POLYPS  The various methods of treatment have been discussed with the patient and family. After consideration of risks, benefits and other options for treatment, the patient has consented to  Procedure(s): COLONOSCOPY WITH PROPOFOL (N/A) as a surgical intervention .  The patient's history has been reviewed, patient examined, no change in status, stable for surgery.  I have reviewed the patient's chart and labs.  Questions were answered to the patient's satisfaction.     [name]

## 2016-04-28 NOTE — Anesthesia Postprocedure Evaluation (Signed)
Anesthesia Post Note  Patient: Jennifer Mcgee  Procedure(s) Performed: Procedure(s) (LRB): COLONOSCOPY WITH PROPOFOL (N/A)  Patient location during evaluation: Endoscopy Anesthesia Type: General Level of consciousness: awake and alert Pain management: pain level controlled Vital Signs Assessment: post-procedure vital signs reviewed and stable Respiratory status: spontaneous breathing, nonlabored ventilation, respiratory function stable and patient connected to nasal cannula oxygen Cardiovascular status: blood pressure returned to baseline and stable Postop Assessment: no signs of nausea or vomiting Anesthetic complications: no    Last Vitals:  Filed Vitals:   04/28/16 1044 04/28/16 1054  BP: 114/64 129/69  Pulse: 79 74  Temp:    Resp: 15 16    Last Pain: There were no vitals filed for this visit.               Roberto Hlavaty S

## 2016-04-30 ENCOUNTER — Encounter: Payer: Self-pay | Admitting: General Surgery

## 2016-05-17 ENCOUNTER — Other Ambulatory Visit: Payer: Self-pay | Admitting: Family Medicine

## 2016-05-17 DIAGNOSIS — Z1231 Encounter for screening mammogram for malignant neoplasm of breast: Secondary | ICD-10-CM

## 2016-05-18 ENCOUNTER — Ambulatory Visit (INDEPENDENT_AMBULATORY_CARE_PROVIDER_SITE_OTHER): Payer: Medicare Other | Admitting: Family Medicine

## 2016-05-18 VITALS — BP 128/68 | HR 84 | Temp 97.9°F | Resp 16 | Ht 62.75 in | Wt 208.0 lb

## 2016-05-18 DIAGNOSIS — Z0000 Encounter for general adult medical examination without abnormal findings: Secondary | ICD-10-CM

## 2016-05-18 DIAGNOSIS — E119 Type 2 diabetes mellitus without complications: Secondary | ICD-10-CM | POA: Diagnosis not present

## 2016-05-18 DIAGNOSIS — E7800 Pure hypercholesterolemia, unspecified: Secondary | ICD-10-CM | POA: Diagnosis not present

## 2016-05-18 DIAGNOSIS — I10 Essential (primary) hypertension: Secondary | ICD-10-CM | POA: Diagnosis not present

## 2016-05-18 NOTE — Progress Notes (Signed)
Patient ID: Jennifer Mcgee, female   DOB: 11/22/1941, 75 y.o.   MRN: [id number] Patient: Jennifer Mcgee, Female    DOB: [birth year], 75 y.o.   MRN: [id number] Visit Date: 05/18/2016  Today's Provider: Wilhemena Durie, MD   Chief Complaint  Patient presents with  . Annual Exam   Subjective:   Jennifer Mcgee is a 75 y.o. female who presents today for her Subsequent Annual Wellness Visit. She feels well. She reports exercising 3 days weekly. She reports she is sleeping fairly well. Her blood pressure, lipids, and diabetes are well controlled. She feels well overall. It is time for her to have blood work. She has no complications of her diabetes.  Immunization History  Administered Date(s) Administered  . Influenza-Unspecified 10/21/2015  . Pneumococcal Conjugate-13 03/22/2014  . Tdap 10/25/2014  . Zoster 11/01/2011   04/28/2016 Colonoscopy-f/u 5 years Mammogram- scheduled 05/24/16 [facility] reports it as completed Pap-s/p Hysterectomy  Review of Systems  Constitutional: Negative.   HENT: Positive for dental problem.   Eyes: Negative.   Respiratory: Negative.   Cardiovascular: Negative.   Gastrointestinal: Negative.   Endocrine: Negative.   Musculoskeletal: Positive for back pain and arthralgias.  Skin: Negative.   Allergic/Immunologic: Negative.   Neurological: Negative.   Hematological: Negative.   Psychiatric/Behavioral: Negative.     Patient Active Problem List   Diagnosis Date Noted  . Bronchitis 09/30/2015  . Adaptation reaction 04/03/2015  . Absolute anemia 04/03/2015  . At risk of disease 04/03/2015  . 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 Back pain, chronic 04/03/2015  . Colon polyp 04/03/2015  . Diabetes ([place]) 04/03/2015  . 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 Gout 04/03/2015  . Blood in the urine 04/03/2015  . Calcium blood increased 04/03/2015  . Hypercholesteremia 04/03/2015  . High sodium levels 04/03/2015  . BP (high blood pressure) 04/03/2015  .  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 Arthritis, degenerative 04/03/2015  . RAD (reactive airway disease) 04/03/2015  . Diverticulitis of colon without hemorrhage 04/02/2013  . Personal history of colonic polyps 03/09/2013    Social History   Social History  . Marital Status: Married    Spouse Name: N/A  . Number of Children: N/A  . Years of Education: N/A   Occupational History  . Not on file.   Social History Main Topics  . Smoking status: Never Smoker   . Smokeless tobacco: Never Used  . Alcohol Use: Yes     Comment: rare  . Drug Use: No  . Sexual Activity: Not on file   Other Topics Concern  . Not on file   Social History Narrative    Past Surgical History  Procedure Laterality Date  . Colonoscopy  03-28-13    Dr Jamal Collin  . Back surgery    . Abdominal hysterectomy  1981  . Breast surgery      biopsy- benign  . Breast surgery  1977    duct operation  . Appendectomy  07/2004    Dr. Jamal Collin  . Eye surgery Bilateral 2013    cataract  . Excision / biopsy breast / nipple / duct Left 01/20/1973    milk duct removed  . Colonoscopy with propofol N/A 04/28/2016    Procedure: COLONOSCOPY WITH PROPOFOL;  Surgeon: Christene Lye, MD;  Location: [facility] ENDOSCOPY;  Service: Endoscopy;  Laterality: N/A;    Her family history includes COPD in her mother; Cancer (age of onset: 44) in her mother; Dementia in her father; Diabetes in her paternal grandmother;  Heart attack in her paternal grandfather; Heart disease in her mother; Hyperlipidemia in her mother; Hypertension in her father and mother.    Outpatient Prescriptions Prior to Visit  Medication Sig Dispense Refill  . ACCU-CHEK FASTCLIX LANCETS MISC USE DAILY 102 each 3  . aspirin 81 MG tablet Take by mouth.    Marland Kitchen atorvastatin (LIPITOR) 20 MG tablet Take 1 tablet (20 mg total) by mouth at bedtime. 90 tablet 3  . Cholecalciferol (VITAMIN D3) 2000 UNITS TABS Take by mouth.    . Cyanocobalamin (VITAMIN B 12 PO) Take 1,000 mg  by mouth daily.    . ferrous sulfate 325 (65 FE) MG tablet Take by mouth.    Marland Kitchen GLIPIZIDE XL 2.5 MG 24 hr tablet TAKE 1 TABLET EVERY DAY 90 tablet 3  . glucose blood (ACCU-CHEK SMARTVIEW) test strip To check blood sugar once a day.  DX: E11.9 100 each 1  . hydrochlorothiazide (HYDRODIURIL) 12.5 MG tablet TAKE 1 TABLET (12.5 MG TOTAL) BY MOUTH DAILY. 90 tablet 1  . MAGNESIUM GLUCONATE PO Take by mouth.    . metFORMIN (GLUCOPHAGE) 1000 MG tablet Take 1 tablet (1,000 mg total) by mouth 2 (two) times daily with a meal. 180 tablet 3  . Multiple Vitamins-Minerals (PRESERVISION/LUTEIN) CAPS Take by mouth.    . polyethylene glycol (GOLYTELY) 236 g solution Take 4,000 mLs by mouth once. 4000 mL 0  . quinapril (ACCUPRIL) 20 MG tablet TAKE 1 TABLET DAILY BY MOUTH 90 tablet 1  . Fluticasone-Salmeterol (ADVAIR DISKUS) 250-50 MCG/DOSE AEPB Inhale 1 puff into the lungs 2 (two) times daily. 1 each 0   No facility-administered medications prior to visit.    Allergies  Allergen Reactions  . Epinephrine Other (See Comments)    Feels jumpy Jerking movements all over the body.   . Penicillins Itching  . Levaquin [Levofloxacin In D5w] Anxiety    Patient Care Team: [name], MD as PCP - General (Family Medicine) Seeplaputhur Robinette Haines, MD (General Surgery)  Objective:   Vitals:  Filed Vitals:   05/18/16 1013  BP: 128/68  Pulse: 84  Temp: 97.9 F (36.6 C)  TempSrc: Oral  Resp: 16  Height: 5' 2.75" (1.594 m)  Weight: 208 lb (94.348 kg)    Physical Exam  Constitutional: She is oriented to person, place, and time. She appears well-developed and well-nourished.  HENT:  Head: Normocephalic and atraumatic.  Right Ear: External ear normal.  Left Ear: External ear normal.  Nose: Nose normal.  Mouth/Throat: Oropharynx is clear and moist.  Eyes: Conjunctivae and EOM are normal. Pupils are equal, round, and reactive to light.  Neck: Normal range of motion. Neck supple.  Cardiovascular: Normal  rate, regular rhythm, normal heart sounds and intact distal pulses.   Pulmonary/Chest: Breath sounds normal.  Abdominal: Soft. Bowel sounds are normal.  Musculoskeletal: Normal range of motion.  Neurological: She is alert and oriented to person, place, and time.  Skin: Skin is warm and dry.  Psychiatric: She has a normal mood and affect. Her behavior is normal. Judgment and thought content normal.    Activities of Daily Living In your present state of health, do you have any difficulty performing the following activities: 05/18/2016 11/05/2015  Hearing? N N  Vision? N N  Difficulty concentrating or making decisions? N N  Walking or climbing stairs? N N  Dressing or bathing? N N  Doing errands, shopping? N N    Fall Risk Assessment Fall Risk  05/18/2016 11/05/2015 06/04/2015  Falls in  the past year? No No No     Depression Screen PHQ 2/9 Scores 05/18/2016 11/05/2015 06/04/2015  PHQ - 2 Score 0 0 0    Cognitive Testing - 6-CIT    Year: 0 4 points  Month: 0 3 points  Memorize "Pia Mau, [address] [place]"  Time (within 1 hour:) 0 3 points  Count backwards from 20: 0 2 4 points  Name months of year: 0 2 4 points  Repeat Address: 0 2 4 6 8 10  points   Total Score: 2/28  Interpretation : Normal (0-7) Abnormal (8-28)    Assessment & Plan:     Annual Wellness Visit  Reviewed patient's Family Medical History Reviewed and updated list of patient's medical providers Assessment of cognitive impairment was done Assessed patient's functional ability Established a written schedule for health screening [place] Completed and Reviewed  Exercise Activities and Dietary recommendations Goals    None      Immunization History  Administered Date(s) Administered  . Influenza-Unspecified 10/21/2015  . Pneumococcal Conjugate-13 03/22/2014  . Tdap 10/25/2014  . Zoster 11/01/2011    Health Maintenance  Topic Date Due  . FOOT EXAM  11/04/1951  .  OPHTHALMOLOGY EXAM  11/04/1951  . PNA vac Low Risk Adult (2 of 2 - PPSV23) 03/23/2015  . HEMOGLOBIN A1C  05/04/2016  . INFLUENZA VACCINE  07/06/2016  . MAMMOGRAM  04/20/2017  . TETANUS/TDAP  10/25/2024  . COLONOSCOPY  04/28/2026  . DEXA SCAN  Completed  . ZOSTAVAX  Completed     1. Type 2 diabetes mellitus without complication, without long-term current use of insulin (HCC)  - Hemoglobin A1c  2. Essential hypertension  - CBC with Differential/Platelet - Comprehensive metabolic panel - TSH  3. Hypercholesteremia  - Lipid Panel With LDL/HDL Ratio  Follow up in 4 months  Discussed health benefits of physical activity, and encouraged her to engage in regular exercise appropriate for her age and condition.   Patient was seen and examined by Dr. Delfino Lovett L. Cranford Mon and the note was scribed by Althea Charon, RMA.   Miguel Aschoff MD [facility] 05/18/2016 10:16 AM  ------------------------------------------------------------------------------------------------------------

## 2016-05-24 ENCOUNTER — Other Ambulatory Visit: Payer: Self-pay | Admitting: Family Medicine

## 2016-05-24 ENCOUNTER — Ambulatory Visit
Admission: RE | Admit: 2016-05-24 | Discharge: 2016-05-24 | Disposition: A | Discharge status: Home / Self Care | Payer: Medicare Other | Source: Ambulatory Visit | Attending: Family Medicine | Admitting: Family Medicine

## 2016-05-24 DIAGNOSIS — Z1231 Encounter for screening mammogram for malignant neoplasm of breast: Secondary | ICD-10-CM | POA: Diagnosis present

## 2016-05-26 LAB — CBC WITH DIFFERENTIAL/PLATELET
Basophils Absolute: 0 10*3/uL (ref 0.0–0.2)
Basos: 1 %
EOS (ABSOLUTE): 0.2 10*3/uL (ref 0.0–0.4)
Eos: 4 %
Hematocrit: 35.1 % (ref 34.0–46.6)
Hemoglobin: 11.6 g/dL (ref 11.1–15.9)
IMMATURE GRANULOCYTES: 0 %
Immature Grans (Abs): 0 10*3/uL (ref 0.0–0.1)
LYMPHS ABS: 2 10*3/uL (ref 0.7–3.1)
Lymphs: 34 %
MCH: 29 pg (ref 26.6–33.0)
MCHC: 33 g/dL (ref 31.5–35.7)
MCV: 88 fL (ref 79–97)
MONOS ABS: 0.5 10*3/uL (ref 0.1–0.9)
Monocytes: 9 %
NEUTROS PCT: 52 %
Neutrophils Absolute: 3.1 10*3/uL (ref 1.4–7.0)
PLATELETS: 399 10*3/uL — AB (ref 150–379)
RBC: 4 x10E6/uL (ref 3.77–5.28)
RDW: 14.7 % (ref 12.3–15.4)
WBC: 5.8 10*3/uL (ref 3.4–10.8)

## 2016-05-26 LAB — COMPREHENSIVE METABOLIC PANEL
A/G RATIO: 1.5 (ref 1.2–2.2)
ALK PHOS: 72 IU/L (ref 39–117)
ALT: 16 IU/L (ref 0–32)
AST: 17 IU/L (ref 0–40)
Albumin: 3.8 g/dL (ref 3.5–4.8)
BUN/Creatinine Ratio: 20 (ref 12–28)
BUN: 18 mg/dL (ref 8–27)
Bilirubin Total: 0.6 mg/dL (ref 0.0–1.2)
CALCIUM: 9.5 mg/dL (ref 8.7–10.3)
CO2: 25 mmol/L (ref 18–29)
CREATININE: 0.89 mg/dL (ref 0.57–1.00)
Chloride: 102 mmol/L (ref 96–106)
GFR calc Af Amer: 74 mL/min/{1.73_m2} (ref >59)
GFR, EST NON AFRICAN AMERICAN: 64 mL/min/{1.73_m2} (ref >59)
GLOBULIN, TOTAL: 2.5 g/dL (ref 1.5–4.5)
Glucose: 118 mg/dL — ABNORMAL HIGH (ref 65–99)
POTASSIUM: 4.9 mmol/L (ref 3.5–5.2)
SODIUM: 144 mmol/L (ref 134–144)
Total Protein: 6.3 g/dL (ref 6.0–8.5)

## 2016-05-26 LAB — LIPID PANEL WITH LDL/HDL RATIO
CHOLESTEROL TOTAL: 155 mg/dL (ref 100–199)
HDL: 60 mg/dL (ref >39)
LDL Calculated: 74 mg/dL (ref 0–99)
LDl/HDL Ratio: 1.2 ratio units (ref 0.0–3.2)
Triglycerides: 107 mg/dL (ref 0–149)
VLDL CHOLESTEROL CAL: 21 mg/dL (ref 5–40)

## 2016-05-26 LAB — HEMOGLOBIN A1C
ESTIMATED AVERAGE GLUCOSE: 143 mg/dL
HEMOGLOBIN A1C: 6.6 % — AB (ref 4.8–5.6)

## 2016-05-26 LAB — TSH: TSH: 2.37 u[IU]/mL (ref 0.450–4.500)

## 2016-08-14 ENCOUNTER — Other Ambulatory Visit: Payer: Self-pay | Admitting: Family Medicine

## 2016-08-14 DIAGNOSIS — E119 Type 2 diabetes mellitus without complications: Secondary | ICD-10-CM

## 2016-08-27 ENCOUNTER — Ambulatory Visit
Admission: RE | Admit: 2016-08-27 | Discharge: 2016-08-27 | Disposition: A | Discharge status: Home / Self Care | Payer: Medicare Other | Source: Ambulatory Visit | Attending: Family Medicine | Admitting: Family Medicine

## 2016-08-27 ENCOUNTER — Ambulatory Visit (INDEPENDENT_AMBULATORY_CARE_PROVIDER_SITE_OTHER): Payer: Medicare Other | Admitting: Family Medicine

## 2016-08-27 ENCOUNTER — Encounter: Payer: Self-pay | Admitting: Family Medicine

## 2016-08-27 ENCOUNTER — Ambulatory Visit: Payer: Medicare Other | Admitting: Family Medicine

## 2016-08-27 VITALS — BP 112/74 | HR 88 | Temp 98.1°F | Resp 16

## 2016-08-27 DIAGNOSIS — M179 Osteoarthritis of knee, unspecified: Secondary | ICD-10-CM

## 2016-08-27 DIAGNOSIS — M25462 Effusion, left knee: Secondary | ICD-10-CM | POA: Insufficient documentation

## 2016-08-27 DIAGNOSIS — M1710 Unilateral primary osteoarthritis, unspecified knee: Secondary | ICD-10-CM

## 2016-08-27 DIAGNOSIS — M129 Arthropathy, unspecified: Secondary | ICD-10-CM | POA: Diagnosis not present

## 2016-08-27 DIAGNOSIS — M25562 Pain in left knee: Secondary | ICD-10-CM | POA: Diagnosis not present

## 2016-08-27 MED ORDER — IBUPROFEN 600 MG PO TABS: 600.0000 mg | ORAL_TABLET | Freq: Three times a day (TID) | ORAL | 2 refills | Status: DC | PRN

## 2016-08-27 NOTE — Progress Notes (Signed)
Patient: Jennifer Mcgee Female    DOB: 1941-08-28   75 y.o.   MRN: [id number] Visit Date: 08/27/2016  Today's Provider: Wilhemena Durie, MD   Chief Complaint  Patient presents with  . Knee Pain   Subjective:    Knee Pain   There was no injury mechanism. The pain is present in the left knee. The quality of the pain is described as aching. The pain is at a severity of 10/10. The pain is severe. The pain has been worsening since onset. Associated symptoms include an inability to bear weight, a loss of motion, muscle weakness, numbness and tingling. Pertinent negatives include no loss of sensation. The symptoms are aggravated by weight bearing. She has tried NSAIDs for the symptoms.  Pain started about 2 weeks ago, was moderate at that time. Pain resolved, then recurred yesterday. Is now significant pain. Radiates to ankle.      Allergies  Allergen Reactions  . Epinephrine Other (See Comments)    Feels jumpy Jerking movements all over the body.   . Penicillins Itching  . Levaquin [Levofloxacin In D5w] Anxiety     Current Outpatient Prescriptions:  .  ACCU-CHEK FASTCLIX LANCETS MISC, USE DAILY, Disp: 102 each, Rfl: 3 .  ACCU-CHEK SMARTVIEW test strip, TO CHECK BLOOD SUGAR ONCE A DAY. DX: E11.9, Disp: 30 each, Rfl: 12 .  aspirin 81 MG tablet, Take by mouth., Disp: , Rfl:  .  atorvastatin (LIPITOR) 20 MG tablet, Take 1 tablet (20 mg total) by mouth at bedtime., Disp: 90 tablet, Rfl: 3 .  Cholecalciferol (VITAMIN D3) 2000 UNITS TABS, Take by mouth., Disp: , Rfl:  .  Cyanocobalamin (VITAMIN B 12 PO), Take 1,000 mg by mouth daily., Disp: , Rfl:  .  ferrous sulfate 325 (65 FE) MG tablet, Take by mouth., Disp: , Rfl:  .  GLIPIZIDE XL 2.5 MG 24 hr tablet, TAKE 1 TABLET EVERY DAY, Disp: 90 tablet, Rfl: 3 .  hydrochlorothiazide (HYDRODIURIL) 12.5 MG tablet, TAKE 1 TABLET (12.5 MG TOTAL) BY MOUTH DAILY., Disp: 90 tablet, Rfl: 1 .  MAGNESIUM GLUCONATE PO, Take by mouth., Disp: ,  Rfl:  .  metFORMIN (GLUCOPHAGE) 1000 MG tablet, Take 1 tablet (1,000 mg total) by mouth 2 (two) times daily with a meal., Disp: 180 tablet, Rfl: 3 .  Multiple Vitamins-Minerals (PRESERVISION/LUTEIN) CAPS, Take by mouth., Disp: , Rfl:  .  quinapril (ACCUPRIL) 20 MG tablet, TAKE 1 TABLET DAILY BY MOUTH, Disp: 90 tablet, Rfl: 1  Review of Systems  Constitutional: Negative for appetite change, chills, diaphoresis, fatigue, fever and unexpected weight change.  HENT: Negative.   Eyes: Negative.   Respiratory: Negative for cough and shortness of breath.   Cardiovascular: Negative for chest pain, palpitations and leg swelling.  Endocrine: Negative.   Musculoskeletal: Positive for gait problem and joint swelling (possibly left knee).  Allergic/Immunologic: Negative.   Neurological: Positive for tingling and numbness.    Social History  Substance Use Topics  . Smoking status: Never Smoker  . Smokeless tobacco: Never Used  . Alcohol use Yes     Comment: rare   Objective:   BP 112/74 (BP Location: Left Arm, Patient Position: Sitting, Cuff Size: Normal)   Pulse 88   Temp 98.1 F (36.7 C) (Oral)   Resp 16   Physical Exam  Constitutional: She is oriented to person, place, and time. She appears well-developed and well-nourished.  HENT:  Head: Normocephalic and atraumatic.  Eyes: Conjunctivae are normal.  Neck: No thyromegaly present.  Cardiovascular: Normal rate, regular rhythm and normal heart sounds.   Pulmonary/Chest: Effort normal and breath sounds normal.  Abdominal: Soft.  Lymphadenopathy:    She has no cervical adenopathy.  Neurological: She is alert and oriented to person, place, and time.  Skin: Skin is warm and dry.  Psychiatric: She has a normal mood and affect. Her behavior is normal. Judgment and thought content normal.        Assessment & Plan:     1. Osteoarthritis of knee, unspecified laterality, unspecified osteoarthritis type  - ibuprofen (ADVIL,MOTRIN) 600 MG  tablet; Take 1 tablet (600 mg total) by mouth 3 (three) times daily as needed.  Dispense: 90 tablet; Refill: 2 - Ambulatory referral to Orthopedic Surgery  2. Knee pain, left  - Ambulatory referral to Orthopedic Surgery - DG Knee Complete 4 Views Left; Future  3. Arthropathy  - Ambulatory referral to Orthopedic Surgery     Patient seen and examined by Miguel Aschoff, MD, and note scribed by Renaldo Fiddler, CMA.   Richard Cranford Mon, MD  [facility]

## 2016-09-04 ENCOUNTER — Other Ambulatory Visit: Payer: Self-pay | Admitting: Family Medicine

## 2016-09-04 DIAGNOSIS — E119 Type 2 diabetes mellitus without complications: Secondary | ICD-10-CM

## 2016-09-07 ENCOUNTER — Other Ambulatory Visit: Payer: Self-pay | Admitting: Family Medicine

## 2016-09-07 DIAGNOSIS — I10 Essential (primary) hypertension: Secondary | ICD-10-CM

## 2016-09-24 ENCOUNTER — Other Ambulatory Visit: Payer: Self-pay | Admitting: Family Medicine

## 2016-09-24 DIAGNOSIS — I10 Essential (primary) hypertension: Secondary | ICD-10-CM

## 2016-09-25 ENCOUNTER — Other Ambulatory Visit: Payer: Self-pay | Admitting: Family Medicine

## 2016-09-25 DIAGNOSIS — E7800 Pure hypercholesterolemia, unspecified: Secondary | ICD-10-CM

## 2016-10-04 ENCOUNTER — Ambulatory Visit (INDEPENDENT_AMBULATORY_CARE_PROVIDER_SITE_OTHER): Payer: Medicare Other | Admitting: Family Medicine

## 2016-10-04 VITALS — BP 162/70 | HR 74 | Temp 97.9°F | Resp 16 | Wt 213.0 lb

## 2016-10-04 DIAGNOSIS — E7800 Pure hypercholesterolemia, unspecified: Secondary | ICD-10-CM

## 2016-10-04 DIAGNOSIS — I10 Essential (primary) hypertension: Secondary | ICD-10-CM

## 2016-10-04 DIAGNOSIS — E119 Type 2 diabetes mellitus without complications: Secondary | ICD-10-CM | POA: Diagnosis not present

## 2016-10-04 NOTE — Progress Notes (Signed)
Jennifer Mcgee  MRN: [id number] DOB: Dec 23, 1940  Subjective:  HPI   The patient is a 75 year old female who presents for follow up of her diabetes and hypertension.  She was last seen on 08/27/16 for knee pain for which she was referred to and seen by ortho and given an injection of cortisone about 1 month ago.  Her last A1C was done with her other labs on 05/25/16 and was 6.6.  She reports checking her glucose on a regular basis and has been averaging readings of 80's-120's.  After her injection shse did get a reading of 167.  She denies any signs or symptoms of hypoglycemia.   She does not check her blood pressure outside of the office.  On her last visit here her blood pressure was 112/74.  Her reading today was high and she states it came from her having to hurry to get here.  A repeat of her pressure after talking with her for a few minutes did not reveal any change.  Patient Active Problem List   Diagnosis Date Noted  . Bronchitis 09/30/2015  . Adaptation reaction 04/03/2015  . Absolute anemia 04/03/2015  . At risk of disease 04/03/2015  . 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 Back pain, chronic 04/03/2015  . Colon polyp 04/03/2015  . Diabetes ([place]) 04/03/2015  . 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 Gout 04/03/2015  . Blood in the urine 04/03/2015  . Calcium blood increased 04/03/2015  . Hypercholesteremia 04/03/2015  . High sodium levels 04/03/2015  . BP (high blood pressure) 04/03/2015  . 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 Arthritis, degenerative 04/03/2015  . RAD (reactive airway disease) 04/03/2015  . Diverticulitis of colon without hemorrhage 04/02/2013  . Personal history of colonic polyps 03/09/2013    Past Medical History:  Diagnosis Date  . Anemia   . Colon polyp   . Diabetes mellitus without complication ([place])   . Hyperlipidemia   . Hypertension   . Macular degeneration     Social History   Social History  . Marital status: Married    Spouse name: N/A    . Number of children: N/A  . Years of education: N/A   Occupational History  . Not on file.   Social History Main Topics  . Smoking status: Never Smoker  . Smokeless tobacco: Never Used  . Alcohol use Yes     Comment: rare  . Drug use: No  . Sexual activity: Not on file   Other Topics Concern  . Not on file   Social History Narrative  . No narrative on file    Outpatient Encounter Prescriptions as of 10/04/2016  Medication Sig Note  . ACCU-CHEK FASTCLIX LANCETS MISC USE DAILY   . ACCU-CHEK SMARTVIEW test strip TO CHECK BLOOD SUGAR ONCE A DAY. DX: E11.9   . aspirin 81 MG tablet Take by mouth. 04/03/2015: Received from: [facility]  . atorvastatin (LIPITOR) 20 MG tablet TAKE 1 TABLET (20 MG TOTAL) BY MOUTH AT BEDTIME.   Marland Kitchen Cholecalciferol (VITAMIN D3) 2000 UNITS TABS Take by mouth. 04/03/2015: Received from: [facility]  . Cyanocobalamin (VITAMIN B 12 PO) Take 1,000 mg by mouth daily.   . ferrous sulfate 325 (65 FE) MG tablet Take by mouth. 04/03/2015: Received from: [facility]  . glipiZIDE (GLUCOTROL XL) 2.5 MG 24 hr tablet TAKE ONE TABLET BY MOUTH ONCE DAILY   . hydrochlorothiazide (MICROZIDE) 12.5 MG capsule TAKE 1 CAPSULE (12.5 MG TOTAL) BY MOUTH DAILY.   Marland Kitchen  MAGNESIUM GLUCONATE PO Take by mouth. 04/03/2015: Received from: [facility]  . meloxicam (MOBIC) 15 MG tablet Take 15 mg by mouth daily.   . metFORMIN (GLUCOPHAGE) 1000 MG tablet Take 1 tablet (1,000 mg total) by mouth 2 (two) times daily with a meal.   . Multiple Vitamin (MULTIVITAMIN) tablet Take 1 tablet by mouth daily.   . Multiple Vitamins-Minerals (PRESERVISION/LUTEIN) CAPS Take by mouth. 04/03/2015: Received from: [facility]  . quinapril (ACCUPRIL) 20 MG tablet TAKE 1 TABLET DAILY BY MOUTH   . [DISCONTINUED] ibuprofen (ADVIL,MOTRIN) 600 MG tablet Take 1 tablet (600 mg total) by mouth 3 (three) times daily as needed.    No  facility-administered encounter medications on file as of 10/04/2016.     Allergies  Allergen Reactions  . Epinephrine Other (See Comments)    Feels jumpy Jerking movements all over the body.   . Penicillins Itching  . Levaquin [Levofloxacin In D5w] Anxiety    Review of Systems  Constitutional: Negative for fever and malaise/fatigue.  Respiratory: Negative for cough, shortness of breath and wheezing.   Cardiovascular: Negative for chest pain, palpitations, orthopnea, claudication, leg swelling and PND.  Gastrointestinal: Negative.   Genitourinary: Negative for frequency.  Neurological: Negative for dizziness, weakness and headaches.  Endo/Heme/Allergies: Negative for polydipsia.  Psychiatric/Behavioral: Negative.    Objective:  BP (!) 162/70 (BP Location: Right Arm, Patient Position: Sitting, Cuff Size: Normal)   Pulse 74   Temp 97.9 F (36.6 C) (Oral)   Resp 16   Wt 213 lb (96.6 kg)   BMI 38.03 kg/m   Physical Exam  Constitutional: She is well-developed, well-nourished, and in no distress.  HENT:  Head: Normocephalic and atraumatic.  Right Ear: External ear normal.  Left Ear: External ear normal.  Nose: Nose normal.  Eyes: Pupils are equal, round, and reactive to light.  Neck: Normal range of motion. Neck supple.  Cardiovascular: Normal rate, regular rhythm and normal heart sounds.   Pulmonary/Chest: Effort normal and breath sounds normal.  Abdominal: Soft.  Musculoskeletal: She exhibits edema.  Skin: Skin is warm and dry.  Psychiatric: Mood, memory, affect and judgment normal.    Diabetic Foot Exam - Simple   Simple Foot Form Diabetic Foot exam was performed with the following findings:  Yes 10/04/2016  9:13 AM  Visual Inspection No deformities, no ulcerations, no other skin breakdown bilaterally:  Yes Sensation Testing Intact to touch and monofilament testing bilaterally:  Yes Pulse Check Posterior Tibialis and Dorsalis pulse intact bilaterally:   Yes Comments      Assessment and Plan :   1. Essential hypertension  - CBC with Differential/Platelet - Comprehensive metabolic panel - TSH  2. Hypercholesteremia  - Lipid Panel With LDL/HDL Ratio  3. Type 2 diabetes mellitus without complication, without long-term current use of insulin (HCC) A1c is 6.7 today. 4.Knee OA  HPI, Exam and A&P Transcribed under the direction and in the presence of Wilhemena Durie., MD. Electronically Signed: Althea Charon, RMA I have done the exam and reviewed the chart and it is accurate to the best of my knowledge. Miguel Aschoff M.D. [facility]

## 2016-10-05 LAB — COMPREHENSIVE METABOLIC PANEL
A/G RATIO: 1.9 (ref 1.2–2.2)
ALT: 17 IU/L (ref 0–32)
AST: 16 IU/L (ref 0–40)
Albumin: 4.5 g/dL (ref 3.5–4.8)
Alkaline Phosphatase: 89 IU/L (ref 39–117)
BUN / CREAT RATIO: 22 (ref 12–28)
BUN: 20 mg/dL (ref 8–27)
Bilirubin Total: 0.6 mg/dL (ref 0.0–1.2)
CALCIUM: 9.8 mg/dL (ref 8.7–10.3)
CO2: 29 mmol/L (ref 18–29)
Chloride: 102 mmol/L (ref 96–106)
Creatinine, Ser: 0.9 mg/dL (ref 0.57–1.00)
GFR calc Af Amer: 73 mL/min/{1.73_m2} (ref >59)
GFR, EST NON AFRICAN AMERICAN: 63 mL/min/{1.73_m2} (ref >59)
GLOBULIN, TOTAL: 2.4 g/dL (ref 1.5–4.5)
Glucose: 119 mg/dL — ABNORMAL HIGH (ref 65–99)
POTASSIUM: 4.9 mmol/L (ref 3.5–5.2)
SODIUM: 144 mmol/L (ref 134–144)
Total Protein: 6.9 g/dL (ref 6.0–8.5)

## 2016-10-05 LAB — CBC WITH DIFFERENTIAL/PLATELET
BASOS: 0 %
Basophils Absolute: 0 10*3/uL (ref 0.0–0.2)
EOS (ABSOLUTE): 0.3 10*3/uL (ref 0.0–0.4)
EOS: 3 %
HEMATOCRIT: 36.9 % (ref 34.0–46.6)
Hemoglobin: 11.8 g/dL (ref 11.1–15.9)
Immature Grans (Abs): 0 10*3/uL (ref 0.0–0.1)
Immature Granulocytes: 0 %
LYMPHS ABS: 2.1 10*3/uL (ref 0.7–3.1)
Lymphs: 27 %
MCH: 28.6 pg (ref 26.6–33.0)
MCHC: 32 g/dL (ref 31.5–35.7)
MCV: 89 fL (ref 79–97)
MONOS ABS: 0.8 10*3/uL (ref 0.1–0.9)
Monocytes: 11 %
Neutrophils Absolute: 4.4 10*3/uL (ref 1.4–7.0)
Neutrophils: 59 %
Platelets: 375 10*3/uL (ref 150–379)
RBC: 4.13 x10E6/uL (ref 3.77–5.28)
RDW: 14.7 % (ref 12.3–15.4)
WBC: 7.6 10*3/uL (ref 3.4–10.8)

## 2016-10-05 LAB — TSH: TSH: 2.63 u[IU]/mL (ref 0.450–4.500)

## 2016-10-05 LAB — LIPID PANEL WITH LDL/HDL RATIO
Cholesterol, Total: 178 mg/dL (ref 100–199)
HDL: 58 mg/dL (ref >39)
LDL Calculated: 89 mg/dL (ref 0–99)
LDL/HDL RATIO: 1.5 ratio (ref 0.0–3.2)
TRIGLYCERIDES: 157 mg/dL — AB (ref 0–149)
VLDL Cholesterol Cal: 31 mg/dL (ref 5–40)

## 2016-10-12 ENCOUNTER — Other Ambulatory Visit: Payer: Self-pay | Admitting: Family Medicine

## 2016-10-12 DIAGNOSIS — E119 Type 2 diabetes mellitus without complications: Secondary | ICD-10-CM

## 2016-12-12 ENCOUNTER — Other Ambulatory Visit: Payer: Self-pay | Admitting: Family Medicine

## 2016-12-12 DIAGNOSIS — E119 Type 2 diabetes mellitus without complications: Secondary | ICD-10-CM

## 2016-12-28 ENCOUNTER — Other Ambulatory Visit: Payer: Self-pay

## 2016-12-28 DIAGNOSIS — E119 Type 2 diabetes mellitus without complications: Secondary | ICD-10-CM

## 2016-12-28 MED ORDER — GLIPIZIDE ER 2.5 MG PO TB24: 2.5000 mg | ORAL_TABLET | Freq: Every day | ORAL | 3 refills | Status: DC

## 2017-01-04 ENCOUNTER — Encounter: Payer: Self-pay | Admitting: Family Medicine

## 2017-01-04 ENCOUNTER — Ambulatory Visit (INDEPENDENT_AMBULATORY_CARE_PROVIDER_SITE_OTHER): Payer: Medicare Other | Admitting: Family Medicine

## 2017-01-04 VITALS — BP 146/64 | HR 92 | Temp 98.6°F | Resp 18 | Wt 212.0 lb

## 2017-01-04 DIAGNOSIS — R509 Fever, unspecified: Secondary | ICD-10-CM

## 2017-01-04 DIAGNOSIS — J101 Influenza due to other identified influenza virus with other respiratory manifestations: Secondary | ICD-10-CM | POA: Diagnosis not present

## 2017-01-04 LAB — POC INFLUENZA A&B (BINAX/QUICKVUE)
Influenza A, POC: NEGATIVE
Influenza B, POC: POSITIVE — AB

## 2017-01-04 MED ORDER — OSELTAMIVIR PHOSPHATE 75 MG PO CAPS: 75.0000 mg | ORAL_CAPSULE | Freq: Two times a day (BID) | ORAL | 0 refills | Status: DC

## 2017-01-04 NOTE — Progress Notes (Signed)
Subjective:  HPI Pt is here today for cough, congestion, fever (100-101), scratchy throat, SOB, wheezing, headache, and feeling tight in her chest. She reports that this started yesterday and was sudden. She has been in the urgent care and ER with her husband and around all the flu. She did have a flu vaccine. She denies body aches, chills, sweats. She describes her cough as dry and tight.   Prior to Admission medications   Medication Sig Start Date End Date Taking? Authorizing Provider  ACCU-CHEK FASTCLIX LANCETS MISC USE DAILY 12/13/16  Yes Karell Tukes Maceo Pro., MD  ACCU-CHEK SMARTVIEW test strip TO CHECK BLOOD SUGAR ONCE A DAY. DX: E11.9 08/17/16  Yes Marli Diego Maceo Pro., MD  aspirin 81 MG tablet Take by mouth.   Yes Historical Provider, MD  atorvastatin (LIPITOR) 20 MG tablet TAKE 1 TABLET (20 MG TOTAL) BY MOUTH AT BEDTIME. 09/27/16  Yes Erricka Falkner Maceo Pro., MD  Cholecalciferol (VITAMIN D3) 2000 UNITS TABS Take by mouth.   Yes Historical Provider, MD  Cyanocobalamin (VITAMIN B 12 PO) Take 1,000 mg by mouth daily.   Yes Historical Provider, MD  ferrous sulfate 325 (65 FE) MG tablet Take by mouth.   Yes Historical Provider, MD  glipiZIDE (GLUCOTROL XL) 2.5 MG 24 hr tablet Take 1 tablet (2.5 mg total) by mouth daily. 12/28/16  Yes Tyrian Peart Maceo Pro., MD  hydrochlorothiazide (MICROZIDE) 12.5 MG capsule TAKE 1 CAPSULE (12.5 MG TOTAL) BY MOUTH DAILY. 09/07/16  Yes Queen Abbett Maceo Pro., MD  MAGNESIUM GLUCONATE PO Take by mouth.   Yes Historical Provider, MD  meloxicam (MOBIC) 15 MG tablet Take 15 mg by mouth daily.   Yes Historical Provider, MD  metFORMIN (GLUCOPHAGE) 1000 MG tablet TAKE 1 TABLET (1,000 MG TOTAL) BY MOUTH 2 (TWO) TIMES DAILY WITH A MEAL. 10/12/16  Yes Haydn Hutsell Maceo Pro., MD  Multiple Vitamin (MULTIVITAMIN) tablet Take 1 tablet by mouth daily.   Yes Historical Provider, MD  Multiple Vitamins-Minerals (PRESERVISION/LUTEIN) CAPS Take by mouth.   Yes Historical Provider, MD    quinapril (ACCUPRIL) 20 MG tablet TAKE 1 TABLET DAILY BY MOUTH 09/27/16  Yes Monte Zinni Maceo Pro., MD    Patient Active Problem List   Diagnosis Date Noted  . Bronchitis 09/30/2015  . Adaptation reaction 04/03/2015  . Absolute anemia 04/03/2015  . At risk of disease 04/03/2015  . 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 Back pain, chronic 04/03/2015  . Colon polyp 04/03/2015  . Diabetes ([place]) 04/03/2015  . 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 Gout 04/03/2015  . Blood in the urine 04/03/2015  . Calcium blood increased 04/03/2015  . Hypercholesteremia 04/03/2015  . High sodium levels 04/03/2015  . BP (high blood pressure) 04/03/2015  . 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 Arthritis, degenerative 04/03/2015  . RAD (reactive airway disease) 04/03/2015  . Diverticulitis of colon without hemorrhage 04/02/2013  . Personal history of colonic polyps 03/09/2013    Past Medical History:  Diagnosis Date  . Anemia   . Colon polyp   . Diabetes mellitus without complication ([place])   . Hyperlipidemia   . Hypertension   . Macular degeneration     Social History   Social History  . Marital status: Married    Spouse name: N/A  . Number of children: N/A  . Years of education: N/A   Occupational History  . Not on file.   Social History Main Topics  . Smoking status: Never Smoker  . Smokeless tobacco: Never  Used  . Alcohol use Yes     Comment: rare  . Drug use: No  . Sexual activity: Not on file   Other Topics Concern  . Not on file   Social History Narrative  . No narrative on file    Allergies  Allergen Reactions  . Epinephrine Other (See Comments)    Feels jumpy Jerking movements all over the body.   . Penicillins Itching  . Levaquin [Levofloxacin In D5w] Anxiety    Review of Systems  Constitutional: Positive for fever and malaise/fatigue.  HENT: Positive for congestion, sinus pain and sore throat.        Teeth pain  Eyes: Negative.   Respiratory: Positive for  cough and shortness of breath.   Cardiovascular: Negative.   Gastrointestinal: Negative.   Genitourinary: Negative.   Musculoskeletal: Negative.   Skin: Negative.   Neurological: Negative.   Endo/Heme/Allergies: Negative.   Psychiatric/Behavioral: Negative.     Immunization History  Administered Date(s) Administered  . Influenza-Unspecified 10/21/2015, 01/01/2016  . Pneumococcal Conjugate-13 03/22/2014  . Tdap 10/25/2014  . Zoster 11/01/2011    Objective:  BP (!) 146/64 (BP Location: Left Arm, Patient Position: Sitting, Cuff Size: Large)   Pulse 92   Temp 98.6 F (37 C) (Oral)   Resp 18   Wt 212 lb (96.2 kg)   SpO2 95%   BMI 37.85 kg/m   Physical Exam  Constitutional: She is oriented to person, place, and time and well-developed, well-nourished, and in no distress.  Eyes: Conjunctivae and EOM are normal. Pupils are equal, round, and reactive to light.  Neck: Normal range of motion. Neck supple.  Cardiovascular: Normal rate, regular rhythm, normal heart sounds and intact distal pulses.   Pulmonary/Chest: Effort normal and breath sounds normal.  Musculoskeletal: Normal range of motion.  Neurological: She is alert and oriented to person, place, and time. She has normal reflexes. Gait normal. GCS score is 15.  Skin: Skin is warm and dry.  Psychiatric: Mood, memory, affect and judgment normal.    Lab Results  Component Value Date   WBC 7.6 10/04/2016   HGB 11.6 (A) 03/29/2014   HCT 36.9 10/04/2016   PLT 375 10/04/2016   GLUCOSE 119 (H) 10/04/2016   CHOL 178 10/04/2016   TRIG 157 (H) 10/04/2016   HDL 58 10/04/2016   LDLCALC 89 10/04/2016   TSH 2.630 10/04/2016   HGBA1C 6.6 (H) 05/25/2016    CMP     Component Value Date/Time   NA 144 10/04/2016 0946   K 4.9 10/04/2016 0946   CL 102 10/04/2016 0946   CO2 29 10/04/2016 0946   GLUCOSE 119 (H) 10/04/2016 0946   GLUCOSE 113 (H) 10/03/2009 1505   BUN 20 10/04/2016 0946   CREATININE 0.90 10/04/2016 0946   CALCIUM  9.8 10/04/2016 0946   PROT 6.9 10/04/2016 0946   ALBUMIN 4.5 10/04/2016 0946   AST 16 10/04/2016 0946   ALT 17 10/04/2016 0946   ALKPHOS 89 10/04/2016 0946   BILITOT 0.6 10/04/2016 0946   GFRNONAA 63 10/04/2016 0946   GFRAA 73 10/04/2016 0946    Assessment and Plan :  1. Fever, unspecified fever cause  - POC Influenza A&B(BINAX/QUICKVUE)  2. Influenza B  - oseltamivir (TAMIFLU) 75 MG capsule; Take 1 capsule (75 mg total) by mouth 2 (two) times daily.  Dispense: 10 capsule; Refill: 0 I have done the exam and reviewed the above chart and it is accurate to the best of my knowledge. Development worker, community  has been used in this note in any air is in the dictation or transcription are unintentional.  HPI, Exam, and A&P Transcribed under the direction and in the presence of Eliam Snapp L. Cranford Mon, MD  Electronically Signed: Katina Dung, [place] MD [facility] 01/04/2017 9:30 AM

## 2017-01-18 ENCOUNTER — Encounter: Payer: Self-pay | Admitting: Emergency Medicine

## 2017-01-18 LAB — HM DIABETES EYE EXAM

## 2017-01-20 ENCOUNTER — Encounter: Payer: Self-pay | Admitting: Family Medicine

## 2017-02-01 ENCOUNTER — Telehealth: Payer: Self-pay

## 2017-02-01 NOTE — Telephone Encounter (Signed)
Received information from insurance company in regards to Drug therapy and potential risk. Issue is with Ibuprofen and Meloxicam. Patient is to stop 1 of these. Per epic looks like patient is just taking Meloxicam. LMTCB to verify with patient-aa

## 2017-02-01 NOTE — Telephone Encounter (Signed)
Spoke with husband and patient is taking Meloxicam only and knows to stop it if she starts taking Ibuprofen.-aa

## 2017-02-01 NOTE — Telephone Encounter (Signed)
Pt returned your call ° °teri °

## 2017-02-03 ENCOUNTER — Ambulatory Visit: Payer: Medicare Other | Admitting: Family Medicine

## 2017-02-14 ENCOUNTER — Ambulatory Visit (INDEPENDENT_AMBULATORY_CARE_PROVIDER_SITE_OTHER): Payer: Medicare Other | Admitting: Family Medicine

## 2017-02-14 VITALS — BP 140/76 | HR 92 | Temp 98.2°F | Resp 16

## 2017-02-14 DIAGNOSIS — I10 Essential (primary) hypertension: Secondary | ICD-10-CM | POA: Diagnosis not present

## 2017-02-14 DIAGNOSIS — E119 Type 2 diabetes mellitus without complications: Secondary | ICD-10-CM | POA: Diagnosis not present

## 2017-02-14 LAB — POCT GLYCOSYLATED HEMOGLOBIN (HGB A1C): Hemoglobin A1C: 7
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
## 2017-03-26 ENCOUNTER — Other Ambulatory Visit: Payer: Self-pay | Admitting: Family Medicine

## 2017-03-26 DIAGNOSIS — I10 Essential (primary) hypertension: Secondary | ICD-10-CM

## 2017-04-18 ENCOUNTER — Ambulatory Visit: Payer: Medicare Other | Admitting: Family Medicine

## 2017-06-30 ENCOUNTER — Other Ambulatory Visit: Payer: Self-pay | Admitting: Family Medicine

## 2017-06-30 DIAGNOSIS — Z1231 Encounter for screening mammogram for malignant neoplasm of breast: Secondary | ICD-10-CM

## 2017-07-15 ENCOUNTER — Ambulatory Visit
Admission: RE | Admit: 2017-07-15 | Discharge: 2017-07-15 | Disposition: A | Discharge status: Home / Self Care | Payer: Medicare Other | Source: Ambulatory Visit | Attending: Family Medicine | Admitting: Family Medicine

## 2017-07-15 DIAGNOSIS — Z1231 Encounter for screening mammogram for malignant neoplasm of breast: Secondary | ICD-10-CM | POA: Diagnosis present

## 2017-08-10 ENCOUNTER — Telehealth: Payer: Self-pay | Admitting: Family Medicine

## 2017-08-18 ENCOUNTER — Other Ambulatory Visit: Payer: Self-pay | Admitting: Family Medicine

## 2017-08-18 DIAGNOSIS — E119 Type 2 diabetes mellitus without complications: Secondary | ICD-10-CM

## 2017-08-31 ENCOUNTER — Ambulatory Visit (INDEPENDENT_AMBULATORY_CARE_PROVIDER_SITE_OTHER): Payer: Medicare Other | Admitting: Family Medicine

## 2017-08-31 VITALS — BP 128/74 | HR 56 | Temp 98.8°F | Resp 16 | Wt 214.2 lb

## 2017-08-31 DIAGNOSIS — E119 Type 2 diabetes mellitus without complications: Secondary | ICD-10-CM | POA: Diagnosis not present

## 2017-08-31 DIAGNOSIS — K5750 Diverticulosis of both small and large intestine without perforation or abscess without bleeding: Secondary | ICD-10-CM | POA: Diagnosis not present

## 2017-08-31 DIAGNOSIS — R10821 Right upper quadrant rebound abdominal tenderness: Secondary | ICD-10-CM

## 2017-08-31 MED ORDER — CIPROFLOXACIN HCL 500 MG PO TABS: 500.0000 mg | ORAL_TABLET | Freq: Two times a day (BID) | ORAL | 0 refills | Status: DC

## 2017-08-31 MED ORDER — METRONIDAZOLE 500 MG PO TABS: 500.0000 mg | ORAL_TABLET | Freq: Three times a day (TID) | ORAL | 0 refills | Status: DC

## 2017-08-31 NOTE — Progress Notes (Signed)
Jennifer Mcgee  MRN: [id number] DOB: [birth year]  Subjective:  HPI  Patient is here to discuss abdominal pain-right mid to lower area. Pain does not radiate to other areas. She noticed this Monday on 08/29/17. Just started all of a sudden. Has not noticed a pattern to this. Sometimes pain is present with walking, with certain ways she sits or turns, does hurt with pressure on that side, also hurts when she laying down and turns on the right it will hurt. Has not noticed if food has effect on this. She has not had anything unusual to eat in the past few days. Sensation described as a dull pain. She did have a loose stool yesterday and this morning. Before she had looser stool this morning she developed sharp pain in the whole abdominal area and then went to the bathroom. She has not had any nausea, vomiting, urinary issues, fever. She has felt cold the past 2 nights but not sure if that is related or not. Bowel movements do not relieve the pain that she has noticed.  She has diverticulosis in the entire colon per last colonoscopy in May 2017. She has had appendectomy in the past and hysterectomy-partial-still has ovaries. She still has gallbladder.  Patient Active Problem List   Diagnosis Date Noted  . Bronchitis 09/30/2015  . Adaptation reaction 04/03/2015  . Absolute anemia 04/03/2015  . At risk of disease 04/03/2015  . 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 Back pain, chronic 04/03/2015  . Colon polyp 04/03/2015  . Diabetes ([place]) 04/03/2015  . 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 Gout 04/03/2015  . Blood in the urine 04/03/2015  . Calcium blood increased 04/03/2015  . Hypercholesteremia 04/03/2015  . High sodium levels 04/03/2015  . BP (high blood pressure) 04/03/2015  . 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 Arthritis, degenerative 04/03/2015  . RAD (reactive airway disease) 04/03/2015  . Diverticulitis of colon without hemorrhage 04/02/2013  . Personal history of colonic polyps  03/09/2013    Past Medical History:  Diagnosis Date  . Anemia   . Colon polyp   . Diabetes mellitus without complication ([place])   . Hyperlipidemia   . Hypertension   . Macular degeneration     Social History   Social History  . Marital status: Married    Spouse name: N/A  . Number of children: N/A  . Years of education: N/A   Occupational History  . Not on file.   Social History Main Topics  . Smoking status: Never Smoker  . Smokeless tobacco: Never Used  . Alcohol use Yes     Comment: rare  . Drug use: No  . Sexual activity: Not on file   Other Topics Concern  . Not on file   Social History Narrative  . No narrative on file    Outpatient Encounter Prescriptions as of 08/31/2017  Medication Sig Note  . ACCU-CHEK FASTCLIX LANCETS MISC USE DAILY   . ACCU-CHEK SMARTVIEW test strip TO CHECK BLOOD SUGAR ONCE A DAY. DX: E11.9   . aspirin 81 MG tablet Take by mouth. 04/03/2015: Received from: [facility]  . atorvastatin (LIPITOR) 20 MG tablet TAKE 1 TABLET (20 MG TOTAL) BY MOUTH AT BEDTIME.   Marland Kitchen Cholecalciferol (VITAMIN D3) 2000 UNITS TABS Take by mouth. 04/03/2015: Received from: [facility]  . Cyanocobalamin (VITAMIN B 12 PO) Take 1,000 mg by mouth daily.   . diclofenac (VOLTAREN) 75 MG EC tablet diclofenac sodium 75 mg tablet,delayed release  TAKE 1 TABLET  BY MOUTH TWICE A DAY   . ferrous sulfate 325 (65 FE) MG tablet Take by mouth. 04/03/2015: Received from: [facility]  . glipiZIDE (GLUCOTROL XL) 2.5 MG 24 hr tablet Take 1 tablet (2.5 mg total) by mouth daily.   . hydrochlorothiazide (MICROZIDE) 12.5 MG capsule TAKE 1 CAPSULE (12.5 MG TOTAL) BY MOUTH DAILY.   Marland Kitchen MAGNESIUM GLUCONATE PO Take by mouth. 04/03/2015: Received from: [facility]  . meloxicam (MOBIC) 15 MG tablet meloxicam 15 mg tabs   . metFORMIN (GLUCOPHAGE) 1000 MG tablet TAKE 1 TABLET (1,000 MG TOTAL) BY MOUTH 2 (TWO) TIMES DAILY WITH A  MEAL.   . Multiple Vitamin (MULTIVITAMIN) tablet Take 1 tablet by mouth daily.   . Multiple Vitamins-Minerals (PRESERVISION/LUTEIN) CAPS Take by mouth. 04/03/2015: Received from: [facility]  . oseltamivir (TAMIFLU) 75 MG capsule Take 1 capsule (75 mg total) by mouth 2 (two) times daily.   . quinapril (ACCUPRIL) 20 MG tablet TAKE 1 TABLET BY MOUTH DAILY    No facility-administered encounter medications on file as of 08/31/2017.     Allergies  Allergen Reactions  . Epinephrine Other (See Comments)    Feels jumpy Jerking movements all over the body.   . Penicillins Itching  . Levaquin [Levofloxacin In D5w] Anxiety    Review of Systems  Constitutional: Positive for malaise/fatigue.  Respiratory: Negative.   Cardiovascular: Negative.   Gastrointestinal: Positive for abdominal pain and diarrhea.  Musculoskeletal: Positive for joint pain.  Neurological: Negative.     Objective:  BP 128/74   Pulse (!) 56   Temp 98.8 F (37.1 C)   Resp 16   Wt 214 lb 3.2 oz (97.2 kg)   BMI 38.25 kg/m   Physical Exam  Constitutional: She is oriented to person, place, and time and well-developed, well-nourished, and in no distress.  HENT:  Head: Normocephalic and atraumatic.  Eyes: Pupils are equal, round, and reactive to light. Conjunctivae are normal. Right eye exhibits no discharge. Left eye exhibits no discharge.  Neck: Normal range of motion. Neck supple. No tracheal deviation present. No thyromegaly present.  Cardiovascular: Normal rate, regular rhythm, normal heart sounds and intact distal pulses.  Exam reveals no gallop.   No murmur heard. Pulmonary/Chest: Effort normal and breath sounds normal. No respiratory distress.  Abdominal: Soft. She exhibits no distension. There is tenderness. There is no rebound and no guarding.  Mild tender at the lower edge of RUQ. equilevant Murphy sign.  Musculoskeletal: She exhibits no edema.  Neurological: She is alert and oriented to  person, place, and time.  Skin: No rash noted. No erythema.  Psychiatric: Mood, memory, affect and judgment normal.    Assessment and Plan :  1. Right upper quadrant abdominal pain Probably viral. But will treat as possible diverticulitis. RXs provided to the patient but not to take them unless gets to feeling worse. Get labs and US-pending results. Advised patient important to push fluids. - CBC with Differential/Platelet - Comprehensive metabolic panel - Lipase - metroNIDAZOLE (FLAGYL) 500 MG tablet; Take 1 tablet (500 mg total) by mouth 3 (three) times daily.  Dispense: 21 tablet; Refill: 0 - ciprofloxacin (CIPRO) 500 MG tablet; Take 1 tablet (500 mg total) by mouth 2 (two) times daily.  Dispense: 14 tablet; Refill: 0 - US Abdomen Limited RUQ; Future  2. Diverticulosis of both small and large intestine without bleeding Per colonoscopy in May 2017.  3. Type 2 diabetes mellitus without complication, without long-term current use of insulin ([place])  follow up in 1 month, A1C and routine lab work.  HPI, Exam and A&P transcribed by Tiffany Kocher, RMA under direction and in the presence of Miguel Aschoff, MD. I have done the exam and reviewed the chart and it is accurate to the best of my knowledge. Development worker, community has been used and  any errors in dictation or transcription are unintentional. Miguel Aschoff M.D. [facility]

## 2017-09-01 LAB — COMPREHENSIVE METABOLIC PANEL
AG Ratio: 1.6 (calc) (ref 1.0–2.5)
ALT: 16 U/L (ref 6–29)
AST: 14 U/L (ref 10–35)
Albumin: 4.2 g/dL (ref 3.6–5.1)
Alkaline phosphatase (APISO): 86 U/L (ref 33–130)
BILIRUBIN TOTAL: 0.9 mg/dL (ref 0.2–1.2)
BUN/Creatinine Ratio: 21 (calc) (ref 6–22)
BUN: 20 mg/dL (ref 7–25)
CALCIUM: 9.7 mg/dL (ref 8.6–10.4)
CO2: 29 mmol/L (ref 20–32)
CREATININE: 0.94 mg/dL — AB (ref 0.60–0.93)
Chloride: 101 mmol/L (ref 98–110)
GLUCOSE: 146 mg/dL — AB (ref 65–99)
Globulin: 2.7 g/dL (calc) (ref 1.9–3.7)
Potassium: 4.6 mmol/L (ref 3.5–5.3)
SODIUM: 141 mmol/L (ref 135–146)
TOTAL PROTEIN: 6.9 g/dL (ref 6.1–8.1)

## 2017-09-01 LAB — CBC WITH DIFFERENTIAL/PLATELET
BASOS ABS: 55 {cells}/uL (ref 0–200)
Basophils Relative: 0.5 %
EOS ABS: 143 {cells}/uL (ref 15–500)
EOS PCT: 1.3 %
HCT: 34.9 % — ABNORMAL LOW (ref 35.0–45.0)
HEMOGLOBIN: 11.5 g/dL — AB (ref 11.7–15.5)
Lymphs Abs: 2475 cells/uL (ref 850–3900)
MCH: 29.1 pg (ref 27.0–33.0)
MCHC: 33 g/dL (ref 32.0–36.0)
MCV: 88.4 fL (ref 80.0–100.0)
MONOS PCT: 8.9 %
MPV: 11.3 fL (ref 7.5–12.5)
NEUTROS ABS: 7348 {cells}/uL (ref 1500–7800)
NEUTROS PCT: 66.8 %
Platelets: 401 10*3/uL — ABNORMAL HIGH (ref 140–400)
RBC: 3.95 10*6/uL (ref 3.80–5.10)
RDW: 14.1 % (ref 11.0–15.0)
Total Lymphocyte: 22.5 %
WBC mixed population: 979 cells/uL — ABNORMAL HIGH (ref 200–950)
WBC: 11 10*3/uL — ABNORMAL HIGH (ref 3.8–10.8)

## 2017-09-01 LAB — LIPASE: Lipase: 33 U/L (ref 7–60)

## 2017-09-02 ENCOUNTER — Telehealth: Payer: Self-pay

## 2017-09-02 ENCOUNTER — Ambulatory Visit
Admission: RE | Admit: 2017-09-02 | Discharge: 2017-09-02 | Disposition: A | Discharge status: Home / Self Care | Payer: Medicare Other | Source: Ambulatory Visit | Attending: Family Medicine | Admitting: Family Medicine

## 2017-09-02 DIAGNOSIS — R1011 Right upper quadrant pain: Secondary | ICD-10-CM | POA: Diagnosis present

## 2017-09-02 DIAGNOSIS — R10821 Right upper quadrant rebound abdominal tenderness: Secondary | ICD-10-CM | POA: Insufficient documentation

## 2017-09-02 DIAGNOSIS — N281 Cyst of kidney, acquired: Secondary | ICD-10-CM | POA: Insufficient documentation

## 2017-09-02 NOTE — Telephone Encounter (Signed)
-----   Message from Jerrol Banana., MD sent at 09/02/2017 12:39 PM EDT ----- Labs stable.

## 2017-09-02 NOTE — Telephone Encounter (Signed)
-----   Message from Jerrol Banana., MD sent at 09/02/2017 12:58 PM EDT ----- Ultrasound okay. No gallstones.

## 2017-09-02 NOTE — Telephone Encounter (Signed)
Patient advised.KW 

## 2017-09-22 ENCOUNTER — Other Ambulatory Visit: Payer: Self-pay | Admitting: Family Medicine

## 2017-09-22 DIAGNOSIS — I10 Essential (primary) hypertension: Secondary | ICD-10-CM

## 2017-09-22 DIAGNOSIS — E7800 Pure hypercholesterolemia, unspecified: Secondary | ICD-10-CM

## 2017-09-26 ENCOUNTER — Ambulatory Visit: Payer: Medicare Other | Admitting: Family Medicine

## 2017-09-26 ENCOUNTER — Encounter: Payer: Self-pay | Admitting: Family Medicine

## 2017-09-26 ENCOUNTER — Ambulatory Visit (INDEPENDENT_AMBULATORY_CARE_PROVIDER_SITE_OTHER): Payer: Medicare Other

## 2017-09-26 VITALS — BP 176/80 | HR 88 | Temp 98.2°F | Ht 63.0 in | Wt 216.8 lb

## 2017-09-26 VITALS — BP 154/62 | HR 88 | Temp 98.2°F | Resp 14 | Wt 216.0 lb

## 2017-09-26 DIAGNOSIS — E119 Type 2 diabetes mellitus without complications: Secondary | ICD-10-CM

## 2017-09-26 DIAGNOSIS — E7800 Pure hypercholesterolemia, unspecified: Secondary | ICD-10-CM | POA: Diagnosis not present

## 2017-09-26 DIAGNOSIS — M1712 Unilateral primary osteoarthritis, left knee: Secondary | ICD-10-CM | POA: Diagnosis not present

## 2017-09-26 DIAGNOSIS — K5732 Diverticulitis of large intestine without perforation or abscess without bleeding: Secondary | ICD-10-CM | POA: Diagnosis not present

## 2017-09-26 DIAGNOSIS — Z0000 Encounter for general adult medical examination without abnormal findings: Secondary | ICD-10-CM

## 2017-09-26 DIAGNOSIS — Z6838 Body mass index (BMI) 38.0-38.9, adult: Secondary | ICD-10-CM | POA: Diagnosis not present

## 2017-09-26 DIAGNOSIS — I10 Essential (primary) hypertension: Secondary | ICD-10-CM | POA: Diagnosis not present

## 2017-09-26 LAB — LIPID PANEL
CHOLESTEROL: 170 mg/dL (ref <200)
HDL: 67 mg/dL (ref >50)
LDL Cholesterol (Calc): 80 mg/dL (calc)
NON-HDL CHOLESTEROL (CALC): 103 mg/dL (ref <130)
TRIGLYCERIDES: 124 mg/dL (ref <150)
Total CHOL/HDL Ratio: 2.5 (calc) (ref <5.0)

## 2017-09-26 LAB — POCT UA - MICROALBUMIN: Microalbumin Ur, POC: 20 mg/L

## 2017-09-26 LAB — POCT GLYCOSYLATED HEMOGLOBIN (HGB A1C): Hemoglobin A1C: 6.8

## 2017-09-26 NOTE — Patient Instructions (Signed)
Jennifer Mcgee , Thank you for taking time to come for your Medicare Wellness Visit. I appreciate your ongoing commitment to your health goals. Please review the following plan we discussed and let me know if I can assist you in the future.   Screening recommendations/referrals: Colonoscopy: Up to date Mammogram: Up to date Bone Density: Up to date Recommended yearly ophthalmology/optometry visit for glaucoma screening and checkup Recommended yearly dental visit for hygiene and checkup  Vaccinations: Influenza vaccine: completed Pneumococcal vaccine: completed series Tdap vaccine: Up to date Shingles vaccine: completed 10/2011  Advanced directives: Please bring a copy of your POA (Power of [place]) and/or Living Will to your next appointment.   Conditions/risks identified: Obesity; Recommend monitoring calorie intake and avoiding going over 2000 calories a day.   Next appointment: 11:00 AM today   Preventive Care 56 Years and Older, Female Preventive care refers to lifestyle choices and visits with your health care provider that can promote health and wellness. What does preventive care include?  A yearly physical exam. This is also called an annual well check.  Dental exams once or twice a year.  Routine eye exams. Ask your health care provider how often you should have your eyes checked.  Personal lifestyle choices, including:  Daily care of your teeth and gums.  Regular physical activity.  Eating a healthy diet.  Avoiding tobacco and drug use.  Limiting alcohol use.  Practicing safe sex.  Taking low-dose aspirin every day.  Taking vitamin and mineral supplements as recommended by your health care provider. What happens during an annual well check? The services and screenings done by your health care provider during your annual well check will depend on your age, overall health, lifestyle risk factors, and family history of disease. Counseling  Your health care  provider may ask you questions about your:  Alcohol use.  Tobacco use.  Drug use.  Emotional well-being.  Home and relationship well-being.  Sexual activity.  Eating habits.  History of falls.  Memory and ability to understand (cognition).  Work and work Statistician.  Reproductive health. Screening  You may have the following tests or measurements:  Height, weight, and BMI.  Blood pressure.  Lipid and cholesterol levels. These may be checked every 5 years, or more frequently if you are over 16 years old.  Skin check.  Lung cancer screening. You may have this screening every year starting at age 43 if you have a 30-pack-year history of smoking and currently smoke or have quit within the past 15 years.  Fecal occult blood test (FOBT) of the stool. You may have this test every year starting at age 68.  Flexible sigmoidoscopy or colonoscopy. You may have a sigmoidoscopy every 5 years or a colonoscopy every 10 years starting at age 13.  Hepatitis C blood test.  Hepatitis B blood test.  Sexually transmitted disease (STD) testing.  Diabetes screening. This is done by checking your blood sugar (glucose) after you have not eaten for a while (fasting). You may have this done every 1-3 years.  Bone density scan. This is done to screen for osteoporosis. You may have this done starting at age 30.  Mammogram. This may be done every 1-2 years. Talk to your health care provider about how often you should have regular mammograms. Talk with your health care provider about your test results, treatment options, and if necessary, the need for more tests. Vaccines  Your health care provider may recommend certain vaccines, such as:  Influenza vaccine.  This is recommended every year.  Tetanus, diphtheria, and acellular pertussis (Tdap, Td) vaccine. You may need a Td booster every 10 years.  Zoster vaccine. You may need this after age 84.  Pneumococcal 13-valent conjugate (PCV13)  vaccine. One dose is recommended after age 64.  Pneumococcal polysaccharide (PPSV23) vaccine. One dose is recommended after age 26. Talk to your health care provider about which screenings and vaccines you need and how often you need them. This information is not intended to replace advice given to you by your health care provider. Make sure you discuss any questions you have with your health care provider. Document Released: 12/19/2015 Document Revised: 08/11/2016 Document Reviewed: 09/23/2015 Elsevier Interactive Patient Education  2017 [place] Prevention in the Home Falls can cause injuries. They can happen to people of all ages. There are many things you can do to make your home safe and to help prevent falls. What can I do on the outside of my home?  Regularly fix the edges of walkways and driveways and fix any cracks.  Remove anything that might make you trip as you walk through a door, such as a raised step or threshold.  Trim any bushes or trees on the path to your home.  Use bright outdoor lighting.  Clear any walking paths of anything that might make someone trip, such as rocks or tools.  Regularly check to see if handrails are loose or broken. Make sure that both sides of any steps have handrails.  Any raised decks and porches should have guardrails on the edges.  Have any leaves, snow, or ice cleared regularly.  Use sand or salt on walking paths during winter.  Clean up any spills in your garage right away. This includes oil or grease spills. What can I do in the bathroom?  Use night lights.  Install grab bars by the toilet and in the tub and shower. Do not use towel bars as grab bars.  Use non-skid mats or decals in the tub or shower.  If you need to sit down in the shower, use a plastic, non-slip stool.  Keep the floor dry. Clean up any water that spills on the floor as soon as it happens.  Remove soap buildup in the tub or shower  regularly.  Attach bath mats securely with double-sided non-slip rug tape.  Do not have throw rugs and other things on the floor that can make you trip. What can I do in the bedroom?  Use night lights.  Make sure that you have a light by your bed that is easy to reach.  Do not use any sheets or blankets that are too big for your bed. They should not hang down onto the floor.  Have a firm chair that has side arms. You can use this for support while you get dressed.  Do not have throw rugs and other things on the floor that can make you trip. What can I do in the kitchen?  Clean up any spills right away.  Avoid walking on wet floors.  Keep items that you use a lot in easy-to-reach places.  If you need to reach something above you, use a strong step stool that has a grab bar.  Keep electrical cords out of the way.  Do not use floor polish or wax that makes floors slippery. If you must use wax, use non-skid floor wax.  Do not have throw rugs and other things on the floor that can make  you trip. What can I do with my stairs?  Do not leave any items on the stairs.  Make sure that there are handrails on both sides of the stairs and use them. Fix handrails that are broken or loose. Make sure that handrails are as long as the stairways.  Check any carpeting to make sure that it is firmly attached to the stairs. Fix any carpet that is loose or worn.  Avoid having throw rugs at the top or bottom of the stairs. If you do have throw rugs, attach them to the floor with carpet tape.  Make sure that you have a light switch at the top of the stairs and the bottom of the stairs. If you do not have them, ask someone to add them for you. What else can I do to help prevent falls?  Wear shoes that:  Do not have high heels.  Have rubber bottoms.  Are comfortable and fit you well.  Are closed at the toe. Do not wear sandals.  If you use a stepladder:  Make sure that it is fully  opened. Do not climb a closed stepladder.  Make sure that both sides of the stepladder are locked into place.  Ask someone to hold it for you, if possible.  Clearly mark and make sure that you can see:  Any grab bars or handrails.  First and last steps.  Where the edge of each step is.  Use tools that help you move around (mobility aids) if they are needed. These include:  Canes.  Walkers.  Scooters.  Crutches.  Turn on the lights when you go into a dark area. Replace any light bulbs as soon as they burn out.  Set up your furniture so you have a clear path. Avoid moving your furniture around.  If any of your floors are uneven, fix them.  If there are any pets around you, be aware of where they are.  Review your medicines with your doctor. Some medicines can make you feel dizzy. This can increase your chance of falling. Ask your doctor what other things that you can do to help prevent falls. This information is not intended to replace advice given to you by your health care provider. Make sure you discuss any questions you have with your health care provider. Document Released: 09/18/2009 Document Revised: 04/29/2016 Document Reviewed: 12/27/2014 Elsevier Interactive Patient Education  2017 [employer].

## 2017-09-26 NOTE — Progress Notes (Signed)
Subjective:   Jennifer Mcgee is a 76 y.o. female who presents for Medicare Annual (Subsequent) preventive examination.  Review of Systems:  N/A  Cardiac Risk Factors include: advanced age (>20men, >79 women);diabetes mellitus;dyslipidemia;hypertension;obesity (BMI >30kg/m2)     Objective:     Vitals: BP (!) 176/80 (BP Location: Left Arm)   Pulse 88   Temp 98.2 F (36.8 C) (Oral)   Ht 5\' 3"  (1.6 m)   Wt 216 lb 12.8 oz (98.3 kg)   BMI 38.40 kg/m   Body mass index is 38.4 kg/m.   Tobacco History  Smoking Status  . Never Smoker  Smokeless Tobacco  . Never Used     Counseling given: Not Answered   Past Medical History:  Diagnosis Date  . Anemia   . Colon polyp   . Diabetes mellitus without complication ([place])   . Hyperlipidemia   . Hypertension   . Macular degeneration    Past Surgical History:  Procedure Laterality Date  . ABDOMINAL HYSTERECTOMY  1981  . APPENDECTOMY  07/2004   Dr. Jamal Collin  . BACK SURGERY    . BREAST SURGERY     biopsy- benign  . BREAST SURGERY  1977   duct operation  . COLONOSCOPY  03-28-13   Dr Jamal Collin  . COLONOSCOPY WITH PROPOFOL N/A 04/28/2016   Procedure: COLONOSCOPY WITH PROPOFOL;  Surgeon: Christene Lye, MD;  Location: [facility] ENDOSCOPY;  Service: Endoscopy;  Laterality: N/A;  . EXCISION / BIOPSY BREAST / NIPPLE / DUCT Left 01/20/1973   milk duct removed  . EYE SURGERY Bilateral 2013   cataract   Family History  Problem Relation Age of Onset  . Cancer Mother 91       breast   . Hypertension Mother   . Heart disease Mother   . Hyperlipidemia Mother   . COPD Mother   . Breast cancer Mother 51  . Hypertension Father   . Dementia Father   . Diabetes Paternal Grandmother   . Heart attack Paternal Grandfather    History  Sexual Activity  . Sexual activity: Not on file    Outpatient Encounter Prescriptions as of 09/26/2017  Medication Sig  . ACCU-CHEK FASTCLIX LANCETS MISC USE DAILY  . ACCU-CHEK SMARTVIEW test strip  TO CHECK BLOOD SUGAR ONCE A DAY. DX: E11.9  . aspirin 81 MG tablet Take by mouth.  Marland Kitchen atorvastatin (LIPITOR) 20 MG tablet TAKE 1 TABLET (20 MG TOTAL) BY MOUTH AT BEDTIME.  Marland Kitchen Cholecalciferol (VITAMIN D3) 2000 UNITS TABS Take by mouth.  . Cyanocobalamin (VITAMIN B 12 PO) Take 1,000 mg by mouth daily.  . diclofenac (VOLTAREN) 75 MG EC tablet diclofenac sodium 75 mg tablet,delayed release  TAKE 1 TABLET BY MOUTH TWICE A DAY  . ferrous sulfate 325 (65 FE) MG tablet Take by mouth.  Marland Kitchen FLUZONE HIGH-DOSE 0.5 ML injection TO BE ADMINISTERED BY PHARMACIST FOR IMMUNIZATION  . glipiZIDE (GLUCOTROL XL) 2.5 MG 24 hr tablet Take 1 tablet (2.5 mg total) by mouth daily.  . hydrochlorothiazide (MICROZIDE) 12.5 MG capsule TAKE 1 CAPSULE (12.5 MG TOTAL) BY MOUTH DAILY.  Marland Kitchen MAGNESIUM GLUCONATE PO Take by mouth.  . meloxicam (MOBIC) 15 MG tablet meloxicam 15 mg tabs  . metFORMIN (GLUCOPHAGE) 1000 MG tablet TAKE 1 TABLET (1,000 MG TOTAL) BY MOUTH 2 (TWO) TIMES DAILY WITH A MEAL.  . Multiple Vitamin (MULTIVITAMIN) tablet Take 1 tablet by mouth daily.  . Multiple Vitamins-Minerals (PRESERVISION/LUTEIN) CAPS Take by mouth.  . quinapril (ACCUPRIL) 20 MG tablet  TAKE 1 TABLET BY MOUTH DAILY  . [DISCONTINUED] ciprofloxacin (CIPRO) 500 MG tablet Take 1 tablet (500 mg total) by mouth 2 (two) times daily.  . [DISCONTINUED] metroNIDAZOLE (FLAGYL) 500 MG tablet Take 1 tablet (500 mg total) by mouth 3 (three) times daily.  . [DISCONTINUED] oseltamivir (TAMIFLU) 75 MG capsule Take 1 capsule (75 mg total) by mouth 2 (two) times daily.   No facility-administered encounter medications on file as of 09/26/2017.     Activities of Daily Living In your present state of health, do you have any difficulty performing the following activities: 09/26/2017  Hearing? N  Vision? Y  Comment has Macular Degeneration  Difficulty concentrating or making decisions? N  Walking or climbing stairs? Y  Comment due to knee pain  Dressing or  bathing? N  Doing errands, shopping? N  Preparing Food and eating ? N  Using the Toilet? N  In the past six months, have you accidently leaked urine? N  Do you have problems with loss of bowel control? N  Managing your Medications? N  Managing your Finances? N  Housekeeping or managing your Housekeeping? N  Some recent data might be hidden    Patient Care Team: Jerrol Banana., MD as PCP - General (Family Medicine) Christene Lye, MD (General Surgery) Earnestine Leys, MD as Consulting Physician (Specialist) Ralene Bathe, MD as Consulting Physician (Dermatology)    Assessment:    Exercise Activities and Dietary recommendations Current Exercise Habits: The patient does not participate in regular exercise at present, Exercise limited by: orthopedic condition(s) (knee pain)  Goals    . Reduce calorie intake to 2000 calories per day          Recommend monitoring calorie intake and avoiding going over 2000 calories a day.       Fall Risk Fall Risk  09/26/2017 08/31/2017 05/18/2016 11/05/2015 06/04/2015  Falls in the past year? No No No No No   Depression Screen PHQ 2/9 Scores 09/26/2017 08/31/2017 05/18/2016 11/05/2015  PHQ - 2 Score 0 0 0 0     Cognitive Function: Pt declined screening today.        Immunization History  Administered Date(s) Administered  . Influenza,inj,quad, With Preservative 12/06/2016  . Influenza-Unspecified 10/21/2015, 01/01/2016  . Pneumococcal Conjugate-13 03/22/2014  . Pneumococcal-Unspecified 09/03/2013  . Tdap 10/25/2014  . Zoster 11/01/2011   Screening Tests Health Maintenance  Topic Date Due  . HEMOGLOBIN A1C  08/17/2017  . FOOT EXAM  10/04/2017  . OPHTHALMOLOGY EXAM  01/18/2018  . TETANUS/TDAP  10/25/2024  . COLONOSCOPY  04/28/2026  . INFLUENZA VACCINE  Addressed  . DEXA SCAN  Completed  . PNA vac Low Risk Adult  Completed      Plan:  I have personally reviewed and addressed the Medicare Annual Wellness  questionnaire and have noted the following in the patient's chart:  A. Medical and social history B. Use of alcohol, tobacco or illicit drugs  C. Current medications and supplements D. Functional ability and status E.  Nutritional status F.  Physical activity G. Advance directives H. List of other physicians I.  Hospitalizations, surgeries, and ER visits in previous 12 months J.  [place] such as hearing and vision if needed, cognitive and depression L. Referrals and appointments - none  In addition, I have reviewed and discussed with patient certain preventive protocols, quality metrics, and best practice recommendations. A written personalized care plan for preventive services as well as general preventive health recommendations were provided  to patient.  See attached scanned questionnaire for additional information.   Signed,  Fabio Neighbors, LPN Nurse Health Advisor   MD Recommendations: Pt needs her Hgb A1c checked today.

## 2017-09-26 NOTE — Progress Notes (Signed)
Jennifer Mcgee  MRN: [id number] DOB: 05-Aug-1941  Subjective:  HPI   Patient is here for follow up. Last office visit was on 08/31/17 for RUQ pain-US was ok, CBC, MetC and Lipase were ok. Thought to be viral. RX for Flagyl and Cipro were provided to have in case symptoms got worse. Patient is feeling better, once in while will get a twinge of pain but nothing severe like before, she did not have to take the medications that were given to her.  Patient also due for routine check up. Last routine office visit was on 02/14/17. DM: patient does check her fasting sugar and readings vary-99, 125, 140s. No hypoglycemic episodes or symptoms. No numbness or tingling sensation present. Lab Results  Component Value Date   HGBA1C 7.0 02/14/2017   Wt Readings from Last 3 Encounters:  09/26/17 216 lb (98 kg)  09/26/17 216 lb 12.8 oz (98.3 kg)  08/31/17 214 lb 3.2 oz (97.2 kg)   TSH and Lipid last checked on 10/04/16.  Patient is having severe pain in the left hip and left knee pain. Has seen Dr Sabra Heck and has received cortisone injections in the knee and the last one did not help-late August or early September time. She has been taking Meloxicam and not effective much.  Patient Active Problem List   Diagnosis Date Noted  . Bronchitis 09/30/2015  . Adaptation reaction 04/03/2015  . Absolute anemia 04/03/2015  . At risk of disease 04/03/2015  . 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 Back pain, chronic 04/03/2015  . Colon polyp 04/03/2015  . Diabetes ([place]) 04/03/2015  . 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 Gout 04/03/2015  . Blood in the urine 04/03/2015  . Calcium blood increased 04/03/2015  . Hypercholesteremia 04/03/2015  . High sodium levels 04/03/2015  . BP (high blood pressure) 04/03/2015  . 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 Arthritis, degenerative 04/03/2015  . RAD (reactive airway disease) 04/03/2015  . Diverticulitis of colon without hemorrhage 04/02/2013  . Personal history of  colonic polyps 03/09/2013    Past Medical History:  Diagnosis Date  . Anemia   . Colon polyp   . Diabetes mellitus without complication ([place])   . Hyperlipidemia   . Hypertension   . Macular degeneration     Social History   Social History  . Marital status: Married    Spouse name: N/A  . Number of children: N/A  . Years of education: N/A   Occupational History  . Not on file.   Social History Main Topics  . Smoking status: Never Smoker  . Smokeless tobacco: Never Used  . Alcohol use No  . Drug use: No  . Sexual activity: Not on file   Other Topics Concern  . Not on file   Social History Narrative  . No narrative on file    Outpatient Encounter Prescriptions as of 09/26/2017  Medication Sig Note  . ACCU-CHEK FASTCLIX LANCETS MISC USE DAILY   . ACCU-CHEK SMARTVIEW test strip TO CHECK BLOOD SUGAR ONCE A DAY. DX: E11.9   . aspirin 81 MG tablet Take by mouth. 04/03/2015: Received from: [facility]  . atorvastatin (LIPITOR) 20 MG tablet TAKE 1 TABLET (20 MG TOTAL) BY MOUTH AT BEDTIME.   Marland Kitchen Cholecalciferol (VITAMIN D3) 2000 UNITS TABS Take by mouth. 04/03/2015: Received from: [facility]  . Cyanocobalamin (VITAMIN B 12 PO) Take 1,000 mg by mouth daily.   . diclofenac (VOLTAREN) 75 MG EC tablet diclofenac sodium 75 mg tablet,delayed release  TAKE 1 TABLET BY MOUTH TWICE A DAY   . ferrous sulfate 325 (65 FE) MG tablet Take by mouth. 04/03/2015: Received from: [facility]  . glipiZIDE (GLUCOTROL XL) 2.5 MG 24 hr tablet Take 1 tablet (2.5 mg total) by mouth daily.   . hydrochlorothiazide (MICROZIDE) 12.5 MG capsule TAKE 1 CAPSULE (12.5 MG TOTAL) BY MOUTH DAILY.   Marland Kitchen MAGNESIUM GLUCONATE PO Take by mouth. 04/03/2015: Received from: [facility]  . meloxicam (MOBIC) 15 MG tablet meloxicam 15 mg tabs   . metFORMIN (GLUCOPHAGE) 1000 MG tablet TAKE 1 TABLET (1,000 MG TOTAL) BY MOUTH 2 (TWO) TIMES DAILY WITH A MEAL.     . Multiple Vitamin (MULTIVITAMIN) tablet Take 1 tablet by mouth daily.   . Multiple Vitamins-Minerals (PRESERVISION/LUTEIN) CAPS Take by mouth. 04/03/2015: Received from: [facility]  . quinapril (ACCUPRIL) 20 MG tablet TAKE 1 TABLET BY MOUTH DAILY   . FLUZONE HIGH-DOSE 0.5 ML injection TO BE ADMINISTERED BY PHARMACIST FOR IMMUNIZATION    No facility-administered encounter medications on file as of 09/26/2017.     Allergies  Allergen Reactions  . Epinephrine Other (See Comments)    Feels jumpy Jerking movements all over the body.   . Penicillins Itching  . Levaquin [Levofloxacin In D5w] Anxiety    Review of Systems  Constitutional: Negative.   Eyes: Positive for discharge.  Respiratory: Negative.   Cardiovascular: Negative.   Gastrointestinal: Positive for abdominal pain (off and on). Negative for constipation, diarrhea, heartburn and nausea.  Musculoskeletal: Positive for joint pain.  Skin: Negative.   Neurological: Negative.   Endo/Heme/Allergies: Negative.   Psychiatric/Behavioral: Negative.     Objective:  BP (!) 154/62   Pulse 88   Temp 98.2 F (36.8 C)   Resp 14   Wt 216 lb (98 kg)   BMI 38.26 kg/m   Physical Exam  Constitutional: She is oriented to person, place, and time and well-developed, well-nourished, and in no distress.  HENT:  Head: Normocephalic and atraumatic.  Eyes: Conjunctivae are normal. No scleral icterus.  Neck: No thyromegaly present.  Cardiovascular: Normal rate, regular rhythm and normal heart sounds.   Pulmonary/Chest: Effort normal and breath sounds normal.  Abdominal: Soft. She exhibits no distension and no mass. There is no tenderness. There is no rebound and no guarding.  Neurological: She is alert and oriented to person, place, and time. Gait normal. GCS score is 15.  Skin: Skin is warm and dry.  Psychiatric: Mood, memory, affect and judgment normal.    Assessment and Plan :  1. Type 2 diabetes mellitus without  complication, without long-term current use of insulin (HCC) 6.8 stable. - POCT HgB A1C - POCT UA - Microalbumin  2. Hypercholesteremia - Lipid panel 3.HTN 4.Abdominal Pain Resolving. 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 I have done the exam and reviewed the chart and it is accurate to the best of my knowledge. Development worker, community has been used and  any errors in dictation or transcription are unintentional. Miguel Aschoff M.D. [facility]

## 2017-10-03 ENCOUNTER — Telehealth: Payer: Self-pay

## 2017-10-03 NOTE — Telephone Encounter (Signed)
Pt advised.   Thanks,   -Mylah Baynes  

## 2017-10-03 NOTE — Telephone Encounter (Signed)
-----   Message from Jerrol Banana., MD sent at 09/27/2017  8:11 AM EDT ----- Labs ok.

## 2017-11-09 ENCOUNTER — Other Ambulatory Visit: Payer: Self-pay | Admitting: Family Medicine

## 2017-11-09 DIAGNOSIS — E119 Type 2 diabetes mellitus without complications: Secondary | ICD-10-CM

## 2017-11-09 DIAGNOSIS — I10 Essential (primary) hypertension: Secondary | ICD-10-CM

## 2017-11-23 NOTE — Telephone Encounter (Signed)
Visit complete.

## 2017-12-05 ENCOUNTER — Other Ambulatory Visit: Payer: Self-pay | Admitting: Family Medicine

## 2017-12-05 DIAGNOSIS — E119 Type 2 diabetes mellitus without complications: Secondary | ICD-10-CM

## 2018-01-23 ENCOUNTER — Ambulatory Visit (INDEPENDENT_AMBULATORY_CARE_PROVIDER_SITE_OTHER): Payer: Medicare Other | Admitting: Family Medicine

## 2018-01-23 ENCOUNTER — Encounter: Payer: Self-pay | Admitting: Family Medicine

## 2018-01-23 VITALS — BP 144/76 | HR 96 | Temp 98.0°F | Resp 16 | Wt 216.0 lb

## 2018-01-23 DIAGNOSIS — I10 Essential (primary) hypertension: Secondary | ICD-10-CM | POA: Diagnosis not present

## 2018-01-23 DIAGNOSIS — M1710 Unilateral primary osteoarthritis, unspecified knee: Secondary | ICD-10-CM

## 2018-01-23 DIAGNOSIS — E119 Type 2 diabetes mellitus without complications: Secondary | ICD-10-CM

## 2018-01-23 DIAGNOSIS — E7800 Pure hypercholesterolemia, unspecified: Secondary | ICD-10-CM

## 2018-01-23 DIAGNOSIS — M179 Osteoarthritis of knee, unspecified: Secondary | ICD-10-CM

## 2018-01-23 LAB — POCT GLYCOSYLATED HEMOGLOBIN (HGB A1C): Hemoglobin A1C: 6.5

## 2018-01-23 NOTE — Progress Notes (Signed)
Patient: Jennifer Mcgee Female    DOB: [birth year]   77 y.o.   MRN: [id number] Visit Date: 01/23/2018  Today's Provider: Wilhemena Durie, MD   Chief Complaint  Patient presents with  . Diabetes   Subjective:    HPI  Diabetes Mellitus Type II, Follow-up:   Lab Results  Component Value Date   HGBA1C 6.8 09/26/2017   HGBA1C 7.0 02/14/2017   HGBA1C 6.6 (H) 05/25/2016    Last seen for diabetes 4 months ago.  Management since then includes none. She reports good compliance with treatment. She is having side effects.  Home blood sugar records: 90-100's  Episodes of hypoglycemia? no   Current Insulin Regimen: n/a Most Recent Eye Exam: 01/18/17 Weight trend: stable Current exercise: none  Pertinent Labs:    Component Value Date/Time   CHOL 170 09/26/2017 1228   CHOL 178 10/04/2016 0946   TRIG 124 09/26/2017 1228   HDL 67 09/26/2017 1228   HDL 58 10/04/2016 0946   LDLCALC 89 10/04/2016 0946   CREATININE 0.94 (H) 08/31/2017 1330    Wt Readings from Last 3 Encounters:  01/23/18 216 lb (98 kg)  09/26/17 216 lb (98 kg)  09/26/17 216 lb 12.8 oz (98.3 kg)    ------------------------------------------------------------------------     Allergies  Allergen Reactions  . Epinephrine Other (See Comments)    Feels jumpy Jerking movements all over the body.   . Penicillins Itching  . Levaquin [Levofloxacin In D5w] Anxiety     Current Outpatient Medications:  .  ACCU-CHEK FASTCLIX LANCETS MISC, USE DAILY, Disp: 102 each, Rfl: 3 .  aspirin 81 MG tablet, Take by mouth., Disp: , Rfl:  .  atorvastatin (LIPITOR) 20 MG tablet, TAKE 1 TABLET (20 MG TOTAL) BY MOUTH AT BEDTIME., Disp: 90 tablet, Rfl: 3 .  Cholecalciferol (VITAMIN D3) 2000 UNITS TABS, Take by mouth., Disp: , Rfl:  .  Cyanocobalamin (VITAMIN B 12 PO), Take 1,000 mg by mouth daily., Disp: , Rfl:  .  ferrous sulfate 325 (65 FE) MG tablet, Take by mouth., Disp: , Rfl:  .  glipiZIDE (GLUCOTROL XL) 2.5 MG  24 hr tablet, Take 1 tablet (2.5 mg total) by mouth daily., Disp: 90 tablet, Rfl: 3 .  glucose blood (ACCU-CHEK SMARTVIEW) test strip, Check sugar once daily, DX E11.9, Disp: 50 each, Rfl: 12 .  hydrochlorothiazide (MICROZIDE) 12.5 MG capsule, TAKE 1 CAPSULE (12.5 MG TOTAL) BY MOUTH DAILY., Disp: 90 capsule, Rfl: 3 .  MAGNESIUM GLUCONATE PO, Take by mouth., Disp: , Rfl:  .  meloxicam (MOBIC) 15 MG tablet, meloxicam 15 mg tabs, Disp: , Rfl:  .  metFORMIN (GLUCOPHAGE) 1000 MG tablet, TAKE 1 TABLET (1,000 MG TOTAL) BY MOUTH 2 (TWO) TIMES DAILY WITH A MEAL., Disp: 180 tablet, Rfl: 3 .  Multiple Vitamin (MULTIVITAMIN) tablet, Take 1 tablet by mouth daily., Disp: , Rfl:  .  Multiple Vitamins-Minerals (PRESERVISION/LUTEIN) CAPS, Take by mouth., Disp: , Rfl:  .  quinapril (ACCUPRIL) 20 MG tablet, TAKE 1 TABLET BY MOUTH DAILY, Disp: 90 tablet, Rfl: 1 .  FLUZONE HIGH-DOSE 0.5 ML injection, TO BE ADMINISTERED BY PHARMACIST FOR IMMUNIZATION, Disp: , Rfl: 0  Review of Systems  Constitutional: Negative.   HENT: Negative.   Eyes: Negative.   Respiratory: Negative.   Cardiovascular: Negative.   Gastrointestinal: Negative.   Endocrine: Negative.   Genitourinary: Negative.   Musculoskeletal: Negative.   Skin: Negative.   Allergic/Immunologic: Negative.   Neurological: Negative.   Hematological:  Negative.   Psychiatric/Behavioral: Negative.     Social History   Tobacco Use  . Smoking status: Never Smoker  . Smokeless tobacco: Never Used  Substance Use Topics  . Alcohol use: No   Objective:   BP (!) 148/80 (BP Location: Left Arm, Patient Position: Sitting, Cuff Size: Large)   Pulse 96   Temp 98 F (36.7 C) (Oral)   Resp 16   Wt 216 lb (98 kg)   SpO2 98%   BMI 38.26 kg/m  Vitals:   01/23/18 1033  BP: (!) 148/80  Pulse: 96  Resp: 16  Temp: 98 F (36.7 C)  TempSrc: Oral  SpO2: 98%  Weight: 216 lb (98 kg)     Physical Exam  Constitutional: She is oriented to person, place, and  time. She appears well-developed and well-nourished.  HENT:  Head: Normocephalic and atraumatic.  Eyes: Conjunctivae are normal.  Neck: Normal range of motion. Neck supple. No thyromegaly present.  Cardiovascular: Normal rate, regular rhythm, normal heart sounds and intact distal pulses.  Pulmonary/Chest: Effort normal and breath sounds normal.  Abdominal: Soft.  Musculoskeletal: Normal range of motion.  Neurological: She is alert and oriented to person, place, and time. She has normal reflexes.  Skin: Skin is warm and dry.  Psychiatric: She has a normal mood and affect. Her behavior is normal. Judgment and thought content normal.        Assessment & Plan:     1. Type 2 diabetes mellitus without complication, without long-term current use of insulin (HCC)  - POCT HgB A1C 6.5 today. Pt declines foot exam today due to knee pain. Will get at next OV.   2. Osteoarthritis of knee, unspecified laterality, unspecified osteoarthritis type   3. Hypercholesteremia   4. Essential hypertension Elevated today. Follow. 5.Obesity    HPI, Exam, and A&P Transcribed under the direction and in the presence of Richard L. Cranford Mon, MD  Electronically Signed: Katina Dung, Pink, MD  [facility]

## 2018-02-19 ENCOUNTER — Other Ambulatory Visit: Payer: Self-pay | Admitting: Family Medicine

## 2018-02-19 DIAGNOSIS — E119 Type 2 diabetes mellitus without complications: Secondary | ICD-10-CM

## 2018-03-17 ENCOUNTER — Other Ambulatory Visit: Payer: Self-pay | Admitting: Family Medicine

## 2018-03-17 DIAGNOSIS — M1710 Unilateral primary osteoarthritis, unspecified knee: Secondary | ICD-10-CM

## 2018-03-17 DIAGNOSIS — M179 Osteoarthritis of knee, unspecified: Secondary | ICD-10-CM

## 2018-03-18 ENCOUNTER — Other Ambulatory Visit: Payer: Self-pay | Admitting: Family Medicine

## 2018-03-18 DIAGNOSIS — I10 Essential (primary) hypertension: Secondary | ICD-10-CM

## 2018-03-31 ENCOUNTER — Telehealth: Payer: Self-pay | Admitting: Family Medicine

## 2018-03-31 DIAGNOSIS — E119 Type 2 diabetes mellitus without complications: Secondary | ICD-10-CM

## 2018-03-31 MED ORDER — ACCU-CHEK FASTCLIX LANCETS MISC: 3 refills | Status: DC

## 2018-03-31 NOTE — Telephone Encounter (Signed)
Pt is diabetic and her Accu Check Nano meter broke this am and she needs to have a new one sent into the pharmacy.  She will need this asap.  She checks her levels daily  [facility] uses [facility] [address]  Her call back is [phone]  Thanks, Con Memos

## 2018-03-31 NOTE — Telephone Encounter (Signed)
Sent in and pt advised. 

## 2018-03-31 NOTE — Telephone Encounter (Signed)
Patient only needs lancet device, not the glucometer.  ASAP

## 2018-04-03 ENCOUNTER — Telehealth: Payer: Self-pay | Admitting: Family Medicine

## 2018-04-03 NOTE — Telephone Encounter (Signed)
Pt is requesting an Rx for a lancets pen device be sent to [facility]. Pt is requesting the pen device that the lancets are inserted into with the spring that pricks pt's finger for her to be able to test her sugar. Pt stated that she requested this on 03/31/18 and lancets were sent to pharmacy not the lancet pen device that she had requested. Please advise. Thanks TNP

## 2018-04-04 NOTE — Telephone Encounter (Signed)
[facility]. Tech reports that her Medicare is not covering lancet pen. [facility]. Patient also advised she may need to buy OTC.

## 2018-05-17 ENCOUNTER — Other Ambulatory Visit: Payer: Self-pay | Admitting: Family Medicine

## 2018-05-17 DIAGNOSIS — M1710 Unilateral primary osteoarthritis, unspecified knee: Secondary | ICD-10-CM

## 2018-05-17 DIAGNOSIS — M179 Osteoarthritis of knee, unspecified: Secondary | ICD-10-CM

## 2018-05-23 ENCOUNTER — Ambulatory Visit (INDEPENDENT_AMBULATORY_CARE_PROVIDER_SITE_OTHER): Payer: Medicare Other | Admitting: Family Medicine

## 2018-05-23 VITALS — BP 142/84 | HR 88 | Temp 98.1°F | Resp 16 | Wt 212.0 lb

## 2018-05-23 DIAGNOSIS — E119 Type 2 diabetes mellitus without complications: Secondary | ICD-10-CM

## 2018-05-23 DIAGNOSIS — I10 Essential (primary) hypertension: Secondary | ICD-10-CM

## 2018-05-23 LAB — POCT GLYCOSYLATED HEMOGLOBIN (HGB A1C): HEMOGLOBIN A1C: 6.7 % — AB (ref 4.0–5.6)

## 2018-05-23 MED ORDER — QUINAPRIL HCL 40 MG PO TABS: 40.0000 mg | ORAL_TABLET | Freq: Every day | ORAL | 3 refills | Status: DC

## 2018-05-23 NOTE — Progress Notes (Signed)
Jennifer Mcgee  MRN: [id number] DOB: March 13, 1941  Subjective:  HPI  The patient is a 77 year old female who presents for follow up of chronic health.  She was last seen on 01/23/18.   Diabetes-the patient checks her glucose at home and most readings are normally 110's-130's.  Recently they have been running up to the 150's with one reading of 184.  She states she is in more pain lately because of her knee and also she is not able to be as active and thinks this is contributing to the elevated sugars. Her A1C on the last visit 6.5.  Hypertension-the patient does not check her blood pressure at home but felt like it might be up today.  She feels this is due to her pain and she had a hectic morning.  Patient Active Problem List   Diagnosis Date Noted  . Bronchitis 09/30/2015  . Adaptation reaction 04/03/2015  . At risk of disease 04/03/2015  . 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 Back pain, chronic 04/03/2015  . Colon polyp 04/03/2015  . Diabetes ([place]) 04/03/2015  . 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 Gout 04/03/2015  . Blood in the urine 04/03/2015  . Calcium blood increased 04/03/2015  . Hypercholesteremia 04/03/2015  . High sodium levels 04/03/2015  . BP (high blood pressure) 04/03/2015  . 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 Arthritis, degenerative 04/03/2015  . RAD (reactive airway disease) 04/03/2015  . Diverticulitis of colon without hemorrhage 04/02/2013  . Personal history of colonic polyps 03/09/2013    Past Medical History:  Diagnosis Date  . Anemia   . Colon polyp   . Diabetes mellitus without complication ([place])   . Hyperlipidemia   . Hypertension   . Macular degeneration     Social History   Socioeconomic History  . Marital status: Married    Spouse name: Not on file  . Number of children: Not on file  . Years of education: Not on file  . Highest education level: Not on file  Occupational History  . Not on file  Social Needs  . Financial resource  strain: Not on file  . Food insecurity:    Worry: Not on file    Inability: Not on file  . Transportation needs:    Medical: Not on file    Non-medical: Not on file  Tobacco Use  . Smoking status: Never Smoker  . Smokeless tobacco: Never Used  Substance and Sexual Activity  . Alcohol use: No  . Drug use: No  . Sexual activity: Not on file  Lifestyle  . Physical activity:    Days per week: Not on file    Minutes per session: Not on file  . Stress: Not on file  Relationships  . Social connections:    Talks on phone: Not on file    Gets together: Not on file    Attends religious service: Not on file    Active member of club or organization: Not on file    Attends meetings of clubs or organizations: Not on file    Relationship status: Not on file  . Intimate partner violence:    Fear of current or ex partner: Not on file    Emotionally abused: Not on file    Physically abused: Not on file    Forced sexual activity: Not on file  Other Topics Concern  . Not on file  Social History Narrative  . Not on file    Outpatient Encounter Medications as of  05/23/2018  Medication Sig Note  . ACCU-CHEK FASTCLIX LANCETS MISC Use to check fasting blood sugars once daily.   Marland Kitchen aspirin 81 MG tablet Take by mouth. 04/03/2015: Received from: [facility]  . atorvastatin (LIPITOR) 20 MG tablet TAKE 1 TABLET (20 MG TOTAL) BY MOUTH AT BEDTIME.   Marland Kitchen Cholecalciferol (VITAMIN D3) 2000 UNITS TABS Take by mouth. 04/03/2015: Received from: [facility]  . Cyanocobalamin (VITAMIN B 12 PO) Take 1,000 mg by mouth daily.   . ferrous sulfate 325 (65 FE) MG tablet Take by mouth. 04/03/2015: Received from: [facility]  . glipiZIDE (GLUCOTROL XL) 2.5 MG 24 hr tablet TAKE ONE TABLET BY MOUTH ONCE DAILY   . glucose blood (ACCU-CHEK SMARTVIEW) test strip Check sugar once daily, DX E11.9   . hydrochlorothiazide (MICROZIDE) 12.5 MG capsule TAKE 1 CAPSULE (12.5 MG  TOTAL) BY MOUTH DAILY.   Marland Kitchen ibuprofen (ADVIL,MOTRIN) 600 MG tablet TAKE 1 TABLET (600 MG TOTAL) BY MOUTH 3 (THREE) TIMES DAILY AS NEEDED.   Marland Kitchen MAGNESIUM GLUCONATE PO Take by mouth. 04/03/2015: Received from: [facility]  . meloxicam (MOBIC) 15 MG tablet meloxicam 15 mg tabs   . metFORMIN (GLUCOPHAGE) 1000 MG tablet TAKE 1 TABLET (1,000 MG TOTAL) BY MOUTH 2 (TWO) TIMES DAILY WITH A MEAL.   . Multiple Vitamin (MULTIVITAMIN) tablet Take 1 tablet by mouth daily.   . Multiple Vitamins-Minerals (PRESERVISION/LUTEIN) CAPS Take by mouth. 04/03/2015: Received from: [facility]  . quinapril (ACCUPRIL) 20 MG tablet TAKE 1 TABLET BY MOUTH DAILY   . [DISCONTINUED] FLUZONE HIGH-DOSE 0.5 ML injection TO BE ADMINISTERED BY PHARMACIST FOR IMMUNIZATION    No facility-administered encounter medications on file as of 05/23/2018.     Allergies  Allergen Reactions  . Epinephrine Other (See Comments)    Feels jumpy Jerking movements all over the body.   . Penicillins Itching  . Levaquin [Levofloxacin In D5w] Anxiety    Review of Systems  Constitutional: Negative for fever and malaise/fatigue.  Respiratory: Negative for cough, shortness of breath and wheezing.   Cardiovascular: Positive for leg swelling. Negative for chest pain, palpitations, orthopnea and claudication.  Genitourinary: Negative for frequency.  Neurological: Negative for dizziness and headaches.  Endo/Heme/Allergies: Negative for polydipsia.    Objective:  BP (!) 142/84 (BP Location: Right Arm, Patient Position: Sitting, Cuff Size: Normal)   Pulse 88   Temp 98.1 F (36.7 C) (Oral)   Resp 16   Wt 212 lb (96.2 kg)   BMI 37.55 kg/m   Physical Exam  Constitutional: She is oriented to person, place, and time and well-developed, well-nourished, and in no distress.  HENT:  Head: Normocephalic and atraumatic.  Right Ear: External ear normal.  Left Ear: External ear normal.  Nose: Nose normal.  Eyes:  Conjunctivae are normal.  Neck: No thyromegaly present.  Cardiovascular: Normal rate, regular rhythm and normal heart sounds.  Pulmonary/Chest: Effort normal and breath sounds normal.  Abdominal: Soft.  Lymphadenopathy:    She has no cervical adenopathy.  Neurological: She is alert and oriented to person, place, and time. Gait normal. GCS score is 15.  Skin: Skin is warm and dry.  Psychiatric: Mood, memory, affect and judgment normal.    Assessment and Plan :  1. Type 2 diabetes mellitus without complication, without long-term current use of insulin (HCC)  - POCT glycosylated hemoglobin (Hb A1C)--6.7  2. Essential hypertension Double dose. RTC 3-4 months. - quinapril (ACCUPRIL) 40 MG tablet; Take 1 tablet (40 mg total)  by mouth daily.  Dispense: 90 tablet; Refill: 3  I have done the exam and reviewed the chart and it is accurate to the best of my knowledge. Development worker, community has been used and  any errors in dictation or transcription are unintentional. Miguel Aschoff M.D. [facility]

## 2018-06-18 NOTE — Discharge Instructions (Signed)
°  Instructions after Total Knee Replacement ° ° Nusayba Cadenas P. Jahmere Bramel, Jr., M.D.    ° Dept. of Orthopaedics & Sports Medicine ° [facility] ° [address] ° [place], [place]  [address] ° Phone: [phone]   Fax: [phone] ° °  °DIET: °• Drink plenty of non-alcoholic fluids. °• Resume your normal diet. Include foods high in fiber. ° °ACTIVITY:  °• You may use crutches or a walker with weight-bearing as tolerated, unless instructed otherwise. °• You may be weaned off of the walker or crutches by your Physical Therapist.  °• Do NOT place pillows under the knee. Anything placed under the knee could limit your ability to straighten the knee.   °• Continue doing gentle exercises. Exercising will reduce the pain and swelling, increase motion, and prevent muscle weakness.   °• Please continue to use the TED compression stockings for 6 weeks. You may remove the stockings at night, but should reapply them in the morning. °• Do not drive or operate any equipment until instructed. ° °WOUND CARE:  °• Continue to use the PolarCare or ice packs periodically to reduce pain and swelling. °• You may bathe or shower after the staples are removed at the first office visit following surgery. ° °MEDICATIONS: °• You may resume your regular medications. °• Please take the pain medication as prescribed on the medication. °• Do not take pain medication on an empty stomach. °• You have been given a prescription for a blood thinner (Lovenox or Coumadin). Please take the medication as instructed. (NOTE: After completing a 2 week course of Lovenox, take one Enteric-coated aspirin once a day. This along with elevation will help reduce the possibility of phlebitis in your operated leg.) °• Do not drive or drink alcoholic beverages when taking pain medications. ° °CALL THE OFFICE FOR: °• Temperature above 101 degrees °• Excessive bleeding or drainage on the dressing. °• Excessive swelling, coldness, or paleness of the toes. °• Persistent  nausea and vomiting. ° °FOLLOW-UP:  °• You should have an appointment to return to the office in 10-14 days after surgery. °• Arrangements have been made for continuation of Physical Therapy (either home therapy or outpatient therapy). °  °

## 2018-06-21 ENCOUNTER — Other Ambulatory Visit: Payer: Self-pay

## 2018-06-21 ENCOUNTER — Encounter
Admission: RE | Admit: 2018-06-21 | Discharge: 2018-06-21 | Disposition: A | Discharge status: Home / Self Care | Payer: Medicare Other | Source: Ambulatory Visit | Attending: Orthopedic Surgery | Admitting: Orthopedic Surgery

## 2018-06-21 DIAGNOSIS — I10 Essential (primary) hypertension: Secondary | ICD-10-CM | POA: Insufficient documentation

## 2018-06-21 DIAGNOSIS — Z01810 Encounter for preprocedural cardiovascular examination: Secondary | ICD-10-CM | POA: Insufficient documentation

## 2018-06-21 DIAGNOSIS — I517 Cardiomegaly: Secondary | ICD-10-CM | POA: Diagnosis not present

## 2018-06-21 DIAGNOSIS — Z01812 Encounter for preprocedural laboratory examination: Secondary | ICD-10-CM | POA: Diagnosis not present

## 2018-06-21 DIAGNOSIS — E119 Type 2 diabetes mellitus without complications: Secondary | ICD-10-CM | POA: Diagnosis not present

## 2018-06-21 DIAGNOSIS — I498 Other specified cardiac arrhythmias: Secondary | ICD-10-CM | POA: Insufficient documentation

## 2018-06-21 DIAGNOSIS — I4510 Unspecified right bundle-branch block: Secondary | ICD-10-CM | POA: Diagnosis not present

## 2018-06-21 HISTORY — DX: Unspecified osteoarthritis, unspecified site: M19.90

## 2018-06-21 HISTORY — DX: Varicella without complication: B01.9

## 2018-06-21 LAB — SEDIMENTATION RATE: Sed Rate: 26 mm/hr (ref 0–30)

## 2018-06-21 LAB — URINALYSIS, ROUTINE W REFLEX MICROSCOPIC
Bilirubin Urine: NEGATIVE
Glucose, UA: NEGATIVE mg/dL
Hgb urine dipstick: NEGATIVE
Ketones, ur: NEGATIVE mg/dL
Nitrite: POSITIVE — AB
PH: 6 (ref 5.0–8.0)
Protein, ur: NEGATIVE mg/dL
SPECIFIC GRAVITY, URINE: 1.018 (ref 1.005–1.030)
WBC, UA: >50 WBC/hpf — ABNORMAL HIGH (ref 0–5)

## 2018-06-21 LAB — COMPREHENSIVE METABOLIC PANEL
ALBUMIN: 4.3 g/dL (ref 3.5–5.0)
ALK PHOS: 73 U/L (ref 38–126)
ALT: 21 U/L (ref 0–44)
AST: 27 U/L (ref 15–41)
Anion gap: 13 (ref 5–15)
BILIRUBIN TOTAL: 0.8 mg/dL (ref 0.3–1.2)
BUN: 25 mg/dL — ABNORMAL HIGH (ref 8–23)
CO2: 26 mmol/L (ref 22–32)
Calcium: 9.5 mg/dL (ref 8.9–10.3)
Chloride: 106 mmol/L (ref 98–111)
Creatinine, Ser: 0.94 mg/dL (ref 0.44–1.00)
GFR, EST NON AFRICAN AMERICAN: 57 mL/min — AB (ref >60)
GLUCOSE: 95 mg/dL (ref 70–99)
Potassium: 3.7 mmol/L (ref 3.5–5.1)
Sodium: 145 mmol/L (ref 135–145)
Total Protein: 7.5 g/dL (ref 6.5–8.1)

## 2018-06-21 LAB — APTT: aPTT: 31 seconds (ref 24–36)

## 2018-06-21 LAB — CBC
HEMATOCRIT: 37.2 % (ref 35.0–47.0)
Hemoglobin: 12.4 g/dL (ref 12.0–16.0)
MCH: 29.8 pg (ref 26.0–34.0)
MCHC: 33.4 g/dL (ref 32.0–36.0)
MCV: 89.3 fL (ref 80.0–100.0)
Platelets: 364 10*3/uL (ref 150–440)
RBC: 4.16 MIL/uL (ref 3.80–5.20)
RDW: 14.9 % — ABNORMAL HIGH (ref 11.5–14.5)
WBC: 8.8 10*3/uL (ref 3.6–11.0)

## 2018-06-21 LAB — PROTIME-INR
INR: 0.95
Prothrombin Time: 12.6 seconds (ref 11.4–15.2)

## 2018-06-21 LAB — TYPE AND SCREEN
ABO/RH(D): A POS
Antibody Screen: NEGATIVE

## 2018-06-21 LAB — HEMOGLOBIN A1C
Hgb A1c MFr Bld: 6.5 % — ABNORMAL HIGH (ref 4.8–5.6)
MEAN PLASMA GLUCOSE: 139.85 mg/dL

## 2018-06-21 LAB — C-REACTIVE PROTEIN: CRP: <0.8 mg/dL

## 2018-06-21 LAB — SURGICAL PCR SCREEN
MRSA, PCR: NEGATIVE
STAPHYLOCOCCUS AUREUS: POSITIVE — AB

## 2018-06-21 NOTE — Patient Instructions (Signed)
Your procedure is scheduled on: Wednesday, July 05, 2018 Report to Day Surgery on the 2nd floor of the [facility]. To find out your arrival time, please call [phone] between 1PM - 3PM on: Tuesday, July 04, 2018  REMEMBER: Instructions that are not followed completely may result in serious medical risk, up to and including death; or upon the discretion of your surgeon and anesthesiologist your surgery may need to be rescheduled.  Do not eat food after midnight the night before your procedure.  No gum chewing, lozengers or hard candies.  You may however, drink water up to 2 hours before you are scheduled to arrive for your surgery. Do not drink anything within 2 hours of the start of your surgery.  No Alcohol for 24 hours before or after surgery.  No Smoking including e-cigarettes for 24 hours prior to surgery.  No chewable tobacco products for at least 6 hours prior to surgery.  No nicotine patches on the day of surgery.  On the morning of surgery brush your teeth with toothpaste and water, you may rinse your mouth with mouthwash if you wish. Do not swallow any toothpaste or mouthwash.  Notify your doctor if there is any change in your medical condition (cold, fever, infection).  Do not wear jewelry, make-up, hairpins, clips or nail polish.  Do not wear lotions, powders, or perfumes. You may wear deodorant.  Do not shave 48 hours prior to surgery. Men may shave face and neck.  Contacts and dentures may not be worn into surgery.  Do not bring valuables to the hospital, including drivers license, insurance or credit cards.  [facility] is not responsible for any belongings or valuables.   TAKE THESE MEDICATIONS THE MORNING OF SURGERY:  NONE  Use CHG Soap as directed on instruction sheet.  Stop Metformin 2 days prior to surgery. LAST DAY TO TAKE IS Sunday, July 28; RESUME AFTER SURGERY.  On July 24 - Stop ASPIRIN and Anti-inflammatories (NSAIDS) such as Advil, Aleve,  Ibuprofen, Motrin, Naproxen, Naprosyn and Aspirin based products such as Excedrin, Goodys Powder, BC Powder. (May take Tylenol or Acetaminophen if needed.)  On July 24 - Stop ANY OVER THE COUNTER supplements until after surgery. (magnesium, AREDS) (May continue Vitamin D, Vitamin B, and multivitamin.)  Wear comfortable clothing (specific to your surgery type) to the hospital.  Plan for stool softeners for home use.  If you are being admitted to the hospital overnight, leave your suitcase in the car. After surgery it may be brought to your room.  Please call [phone] if you have any questions about these instructions.

## 2018-06-22 NOTE — Pre-Procedure Instructions (Signed)
LABS FAXED TO DR HOOTEN 

## 2018-06-24 LAB — URINE CULTURE: Special Requests: NORMAL

## 2018-06-26 NOTE — Pre-Procedure Instructions (Signed)
UC FAXED TO DR HOOTEN 

## 2018-07-04 MED ORDER — CLINDAMYCIN PHOSPHATE 900 MG/50ML IV SOLN: 900.0000 mg | INTRAVENOUS | Status: DC

## 2018-07-04 MED ORDER — TRANEXAMIC ACID 1000 MG/10ML IV SOLN
1000.0000 mg | INTRAVENOUS | Status: DC
  Filled 2018-07-04: qty 10

## 2018-07-05 ENCOUNTER — Encounter: Payer: Self-pay | Admitting: Orthopedic Surgery

## 2018-07-05 ENCOUNTER — Other Ambulatory Visit: Payer: Self-pay

## 2018-07-05 ENCOUNTER — Inpatient Hospital Stay: Payer: Medicare Other | Admitting: Anesthesiology

## 2018-07-05 ENCOUNTER — Inpatient Hospital Stay: Payer: Medicare Other

## 2018-07-05 ENCOUNTER — Encounter
Admission: RE | Disposition: A | Discharge status: Home Health | Payer: Self-pay | Source: Ambulatory Visit | Attending: Orthopedic Surgery

## 2018-07-05 ENCOUNTER — Inpatient Hospital Stay
Admission: RE | Admit: 2018-07-05 | Discharge: 2018-07-07 | DRG: 470 | Disposition: A | Discharge status: Home Health | Payer: Medicare Other | Attending: Orthopedic Surgery | Admitting: Orthopedic Surgery

## 2018-07-05 DIAGNOSIS — H3530 Unspecified macular degeneration: Secondary | ICD-10-CM | POA: Diagnosis present

## 2018-07-05 DIAGNOSIS — M1712 Unilateral primary osteoarthritis, left knee: Principal | ICD-10-CM | POA: Diagnosis present

## 2018-07-05 DIAGNOSIS — E119 Type 2 diabetes mellitus without complications: Secondary | ICD-10-CM | POA: Diagnosis present

## 2018-07-05 DIAGNOSIS — E785 Hyperlipidemia, unspecified: Secondary | ICD-10-CM | POA: Diagnosis present

## 2018-07-05 DIAGNOSIS — I10 Essential (primary) hypertension: Secondary | ICD-10-CM | POA: Diagnosis present

## 2018-07-05 DIAGNOSIS — Z96659 Presence of unspecified artificial knee joint: Secondary | ICD-10-CM

## 2018-07-05 DIAGNOSIS — E7800 Pure hypercholesterolemia, unspecified: Secondary | ICD-10-CM | POA: Diagnosis present

## 2018-07-05 HISTORY — PX: KNEE ARTHROPLASTY: SHX992

## 2018-07-05 LAB — GLUCOSE, CAPILLARY
GLUCOSE-CAPILLARY: 166 mg/dL — AB (ref 70–99)
GLUCOSE-CAPILLARY: 268 mg/dL — AB (ref 70–99)
Glucose-Capillary: 189 mg/dL — ABNORMAL HIGH (ref 70–99)
Glucose-Capillary: 201 mg/dL — ABNORMAL HIGH (ref 70–99)

## 2018-07-05 LAB — ABO/RH: ABO/RH(D): A POS

## 2018-07-05 SURGERY — ARTHROPLASTY, KNEE, TOTAL, USING IMAGELESS COMPUTER-ASSISTED NAVIGATION
Anesthesia: General | Site: Knee | Laterality: Left | Wound class: Clean

## 2018-07-05 MED ORDER — TRANEXAMIC ACID 1000 MG/10ML IV SOLN
INTRAVENOUS | Status: DC | PRN
  Administered 2018-07-05: 1000 mg via INTRAVENOUS

## 2018-07-05 MED ORDER — SENNOSIDES-DOCUSATE SODIUM 8.6-50 MG PO TABS
1.0000 | ORAL_TABLET | Freq: Two times a day (BID) | ORAL | Status: DC
  Administered 2018-07-05 – 2018-07-07 (×3): 1 via ORAL
  Filled 2018-07-05 (×4): qty 1

## 2018-07-05 MED ORDER — METOCLOPRAMIDE HCL 5 MG/ML IJ SOLN: 5.0000 mg | Freq: Three times a day (TID) | INTRAMUSCULAR | Status: DC | PRN

## 2018-07-05 MED ORDER — ONDANSETRON HCL 4 MG/2ML IJ SOLN: 4.0000 mg | Freq: Once | INTRAMUSCULAR | Status: DC | PRN

## 2018-07-05 MED ORDER — SODIUM CHLORIDE 0.9 % IV SOLN
INTRAVENOUS | Status: DC | PRN
  Administered 2018-07-05: 30 ug/min via INTRAVENOUS

## 2018-07-05 MED ORDER — GLIPIZIDE ER 2.5 MG PO TB24
2.5000 mg | ORAL_TABLET | Freq: Every day | ORAL | Status: DC
  Administered 2018-07-06 – 2018-07-07 (×2): 2.5 mg via ORAL
  Filled 2018-07-05 (×2): qty 1

## 2018-07-05 MED ORDER — VITAMIN D 1000 UNITS PO TABS
2000.0000 [IU] | ORAL_TABLET | Freq: Every day | ORAL | Status: DC
  Administered 2018-07-06 – 2018-07-07 (×2): 2000 [IU] via ORAL
  Filled 2018-07-05 (×2): qty 2

## 2018-07-05 MED ORDER — GABAPENTIN 300 MG PO CAPS
300.0000 mg | ORAL_CAPSULE | Freq: Every day | ORAL | Status: DC
  Administered 2018-07-05 – 2018-07-06 (×2): 300 mg via ORAL
  Filled 2018-07-05 (×2): qty 1

## 2018-07-05 MED ORDER — MAGNESIUM GLUCONATE 500 MG PO TABS
500.0000 mg | ORAL_TABLET | Freq: Every day | ORAL | Status: DC
  Administered 2018-07-06 – 2018-07-07 (×2): 500 mg via ORAL
  Filled 2018-07-05 (×3): qty 1

## 2018-07-05 MED ORDER — ACETAMINOPHEN 10 MG/ML IV SOLN
1000.0000 mg | Freq: Four times a day (QID) | INTRAVENOUS | Status: AC
  Administered 2018-07-05 – 2018-07-06 (×3): 1000 mg via INTRAVENOUS
  Filled 2018-07-05 (×4): qty 100

## 2018-07-05 MED ORDER — HYDROCHLOROTHIAZIDE 12.5 MG PO CAPS
12.5000 mg | ORAL_CAPSULE | Freq: Every day | ORAL | Status: DC
  Administered 2018-07-06 – 2018-07-07 (×2): 12.5 mg via ORAL
  Filled 2018-07-05 (×2): qty 1

## 2018-07-05 MED ORDER — TETRACAINE HCL 1 % IJ SOLN
INTRAMUSCULAR | Status: DC | PRN
  Administered 2018-07-05: 4 mg via INTRASPINAL

## 2018-07-05 MED ORDER — CELECOXIB 200 MG PO CAPS
200.0000 mg | ORAL_CAPSULE | Freq: Two times a day (BID) | ORAL | Status: DC
  Administered 2018-07-05 – 2018-07-07 (×4): 200 mg via ORAL
  Filled 2018-07-05 (×4): qty 1

## 2018-07-05 MED ORDER — SODIUM CHLORIDE 0.9 % IV SOLN
INTRAVENOUS | Status: DC
  Administered 2018-07-05 (×2): via INTRAVENOUS

## 2018-07-05 MED ORDER — TRANEXAMIC ACID 1000 MG/10ML IV SOLN
1000.0000 mg | Freq: Once | INTRAVENOUS | Status: AC
  Administered 2018-07-05: 1000 mg via INTRAVENOUS
  Filled 2018-07-05: qty 1100

## 2018-07-05 MED ORDER — HYDROMORPHONE HCL 1 MG/ML IJ SOLN: 0.5000 mg | INTRAMUSCULAR | Status: DC | PRN

## 2018-07-05 MED ORDER — FERROUS SULFATE 325 (65 FE) MG PO TABS
325.0000 mg | ORAL_TABLET | Freq: Every day | ORAL | Status: DC
  Administered 2018-07-06 – 2018-07-07 (×2): 325 mg via ORAL
  Filled 2018-07-05 (×2): qty 1

## 2018-07-05 MED ORDER — DIPHENHYDRAMINE HCL 12.5 MG/5ML PO ELIX: 12.5000 mg | ORAL_SOLUTION | ORAL | Status: DC | PRN

## 2018-07-05 MED ORDER — ADULT MULTIVITAMIN W/MINERALS CH
1.0000 | ORAL_TABLET | Freq: Every day | ORAL | Status: DC
  Administered 2018-07-06 – 2018-07-07 (×2): 1 via ORAL
  Filled 2018-07-05 (×2): qty 1

## 2018-07-05 MED ORDER — ENOXAPARIN SODIUM 30 MG/0.3ML ~~LOC~~ SOLN
30.0000 mg | Freq: Two times a day (BID) | SUBCUTANEOUS | Status: DC
  Administered 2018-07-06 – 2018-07-07 (×3): 30 mg via SUBCUTANEOUS
  Filled 2018-07-05 (×3): qty 0.3

## 2018-07-05 MED ORDER — ALUM & MAG HYDROXIDE-SIMETH 200-200-20 MG/5ML PO SUSP: 30.0000 mL | ORAL | Status: DC | PRN

## 2018-07-05 MED ORDER — ACETAMINOPHEN 10 MG/ML IV SOLN
INTRAVENOUS | Status: DC | PRN
  Administered 2018-07-05: 1000 mg via INTRAVENOUS

## 2018-07-05 MED ORDER — METFORMIN HCL 500 MG PO TABS
1000.0000 mg | ORAL_TABLET | Freq: Two times a day (BID) | ORAL | Status: DC
  Administered 2018-07-05 – 2018-07-07 (×4): 1000 mg via ORAL
  Filled 2018-07-05 (×5): qty 2

## 2018-07-05 MED ORDER — SODIUM CHLORIDE FLUSH 0.9 % IV SOLN
INTRAVENOUS | Status: AC
  Filled 2018-07-05: qty 40

## 2018-07-05 MED ORDER — FENTANYL CITRATE (PF) 100 MCG/2ML IJ SOLN: 25.0000 ug | INTRAMUSCULAR | Status: DC | PRN

## 2018-07-05 MED ORDER — OXYCODONE HCL 5 MG PO TABS
10.0000 mg | ORAL_TABLET | ORAL | Status: DC | PRN
  Administered 2018-07-06 – 2018-07-07 (×4): 10 mg via ORAL
  Filled 2018-07-05 (×4): qty 2

## 2018-07-05 MED ORDER — BUPIVACAINE HCL (PF) 0.5 % IJ SOLN
INTRAMUSCULAR | Status: DC | PRN
  Administered 2018-07-05: 2.6 mL

## 2018-07-05 MED ORDER — BUPIVACAINE HCL (PF) 0.25 % IJ SOLN
INTRAMUSCULAR | Status: DC | PRN
  Administered 2018-07-05: 60 mL

## 2018-07-05 MED ORDER — SODIUM CHLORIDE 0.9 % IV SOLN
INTRAVENOUS | Status: DC
  Administered 2018-07-05: 17:00:00 via INTRAVENOUS

## 2018-07-05 MED ORDER — PANTOPRAZOLE SODIUM 40 MG PO TBEC
40.0000 mg | DELAYED_RELEASE_TABLET | Freq: Two times a day (BID) | ORAL | Status: DC
  Administered 2018-07-05 – 2018-07-07 (×4): 40 mg via ORAL
  Filled 2018-07-05 (×4): qty 1

## 2018-07-05 MED ORDER — BUPIVACAINE HCL (PF) 0.25 % IJ SOLN
INTRAMUSCULAR | Status: AC
  Filled 2018-07-05: qty 60

## 2018-07-05 MED ORDER — NEOMYCIN-POLYMYXIN B GU 40-200000 IR SOLN
Status: AC
  Filled 2018-07-05: qty 20

## 2018-07-05 MED ORDER — CELECOXIB 200 MG PO CAPS
400.0000 mg | ORAL_CAPSULE | Freq: Once | ORAL | Status: AC
  Administered 2018-07-05: 400 mg via ORAL

## 2018-07-05 MED ORDER — DEXAMETHASONE SODIUM PHOSPHATE 10 MG/ML IJ SOLN
8.0000 mg | Freq: Once | INTRAMUSCULAR | Status: AC
  Administered 2018-07-05: 8 mg via INTRAVENOUS

## 2018-07-05 MED ORDER — PROPOFOL 10 MG/ML IV BOLUS
INTRAVENOUS | Status: DC | PRN
  Administered 2018-07-05: 25 mg via INTRAVENOUS

## 2018-07-05 MED ORDER — INSULIN ASPART 100 UNIT/ML ~~LOC~~ SOLN
0.0000 [IU] | Freq: Three times a day (TID) | SUBCUTANEOUS | Status: DC
  Administered 2018-07-05: 5 [IU] via SUBCUTANEOUS
  Administered 2018-07-06 (×3): 2 [IU] via SUBCUTANEOUS
  Administered 2018-07-07 (×2): 3 [IU] via SUBCUTANEOUS
  Filled 2018-07-05 (×6): qty 1

## 2018-07-05 MED ORDER — FAMOTIDINE 20 MG PO TABS
ORAL_TABLET | ORAL | Status: AC
  Filled 2018-07-05: qty 1

## 2018-07-05 MED ORDER — NEOMYCIN-POLYMYXIN B GU 40-200000 IR SOLN
Status: DC | PRN
  Administered 2018-07-05: 14 mL

## 2018-07-05 MED ORDER — CHLORHEXIDINE GLUCONATE 4 % EX LIQD: 60.0000 mL | Freq: Once | CUTANEOUS | Status: DC

## 2018-07-05 MED ORDER — ARTIFICIAL TEARS OPHTHALMIC OINT
TOPICAL_OINTMENT | OPHTHALMIC | Status: DC | PRN
  Administered 2018-07-05 – 2018-07-06 (×2): via OPHTHALMIC
  Filled 2018-07-05 (×4): qty 3.5

## 2018-07-05 MED ORDER — FLEET ENEMA 7-19 GM/118ML RE ENEM: 1.0000 | ENEMA | Freq: Once | RECTAL | Status: DC | PRN

## 2018-07-05 MED ORDER — SODIUM CHLORIDE 0.9 % IV SOLN
INTRAVENOUS | Status: DC | PRN
  Administered 2018-07-05: 60 mL

## 2018-07-05 MED ORDER — TRAMADOL HCL 50 MG PO TABS
50.0000 mg | ORAL_TABLET | ORAL | Status: DC | PRN
  Administered 2018-07-07: 100 mg via ORAL
  Filled 2018-07-05: qty 2

## 2018-07-05 MED ORDER — VITAMIN B-12 1000 MCG PO TABS
1000.0000 ug | ORAL_TABLET | Freq: Every day | ORAL | Status: DC
  Administered 2018-07-06 – 2018-07-07 (×2): 1000 ug via ORAL
  Filled 2018-07-05 (×2): qty 1

## 2018-07-05 MED ORDER — GABAPENTIN 300 MG PO CAPS
300.0000 mg | ORAL_CAPSULE | Freq: Once | ORAL | Status: AC
  Administered 2018-07-05: 300 mg via ORAL

## 2018-07-05 MED ORDER — BUPIVACAINE LIPOSOME 1.3 % IJ SUSP
INTRAMUSCULAR | Status: AC
  Filled 2018-07-05: qty 20

## 2018-07-05 MED ORDER — METOCLOPRAMIDE HCL 10 MG PO TABS: 5.0000 mg | ORAL_TABLET | Freq: Three times a day (TID) | ORAL | Status: DC | PRN

## 2018-07-05 MED ORDER — FAMOTIDINE 20 MG PO TABS
20.0000 mg | ORAL_TABLET | Freq: Once | ORAL | Status: AC
  Administered 2018-07-05: 20 mg via ORAL

## 2018-07-05 MED ORDER — MENTHOL 3 MG MT LOZG
1.0000 | LOZENGE | OROMUCOSAL | Status: DC | PRN
  Filled 2018-07-05: qty 9

## 2018-07-05 MED ORDER — FENTANYL CITRATE (PF) 100 MCG/2ML IJ SOLN
INTRAMUSCULAR | Status: DC | PRN
  Administered 2018-07-05: 100 ug via INTRAVENOUS

## 2018-07-05 MED ORDER — CLINDAMYCIN PHOSPHATE 900 MG/50ML IV SOLN
INTRAVENOUS | Status: DC | PRN
  Administered 2018-07-05: 900 mg via INTRAVENOUS

## 2018-07-05 MED ORDER — ONDANSETRON HCL 4 MG/2ML IJ SOLN: 4.0000 mg | Freq: Four times a day (QID) | INTRAMUSCULAR | Status: DC | PRN

## 2018-07-05 MED ORDER — ACETAMINOPHEN 325 MG PO TABS: 325.0000 mg | ORAL_TABLET | Freq: Four times a day (QID) | ORAL | Status: DC | PRN

## 2018-07-05 MED ORDER — QUINAPRIL HCL 10 MG PO TABS
40.0000 mg | ORAL_TABLET | Freq: Every day | ORAL | Status: DC
  Administered 2018-07-06 – 2018-07-07 (×2): 40 mg via ORAL
  Filled 2018-07-05 (×3): qty 4

## 2018-07-05 MED ORDER — ATORVASTATIN CALCIUM 20 MG PO TABS
20.0000 mg | ORAL_TABLET | Freq: Every day | ORAL | Status: DC
  Administered 2018-07-05 – 2018-07-06 (×2): 20 mg via ORAL
  Filled 2018-07-05 (×2): qty 1

## 2018-07-05 MED ORDER — BISACODYL 10 MG RE SUPP: 10.0000 mg | Freq: Every day | RECTAL | Status: DC | PRN

## 2018-07-05 MED ORDER — CLINDAMYCIN PHOSPHATE 600 MG/50ML IV SOLN
600.0000 mg | Freq: Four times a day (QID) | INTRAVENOUS | Status: AC
  Administered 2018-07-05 – 2018-07-06 (×4): 600 mg via INTRAVENOUS
  Filled 2018-07-05 (×4): qty 50

## 2018-07-05 MED ORDER — OXYCODONE HCL 5 MG PO TABS
5.0000 mg | ORAL_TABLET | ORAL | Status: DC | PRN
  Administered 2018-07-05 – 2018-07-06 (×4): 5 mg via ORAL
  Filled 2018-07-05 (×4): qty 1

## 2018-07-05 MED ORDER — OCUVITE-LUTEIN PO CAPS
1.0000 | ORAL_CAPSULE | Freq: Two times a day (BID) | ORAL | Status: DC
  Administered 2018-07-05 – 2018-07-07 (×4): 1 via ORAL
  Filled 2018-07-05 (×6): qty 1

## 2018-07-05 MED ORDER — MAGNESIUM HYDROXIDE 400 MG/5ML PO SUSP
30.0000 mL | Freq: Every day | ORAL | Status: DC
  Administered 2018-07-06 – 2018-07-07 (×2): 30 mL via ORAL
  Filled 2018-07-05 (×2): qty 30

## 2018-07-05 MED ORDER — PROPOFOL 500 MG/50ML IV EMUL
INTRAVENOUS | Status: DC | PRN
  Administered 2018-07-05: 65 ug/kg/min via INTRAVENOUS

## 2018-07-05 MED ORDER — ONDANSETRON HCL 4 MG PO TABS: 4.0000 mg | ORAL_TABLET | Freq: Four times a day (QID) | ORAL | Status: DC | PRN

## 2018-07-05 MED ORDER — METOCLOPRAMIDE HCL 10 MG PO TABS
10.0000 mg | ORAL_TABLET | Freq: Three times a day (TID) | ORAL | Status: DC
  Administered 2018-07-05 – 2018-07-07 (×7): 10 mg via ORAL
  Filled 2018-07-05 (×8): qty 1

## 2018-07-05 MED ORDER — PHENOL 1.4 % MT LIQD
1.0000 | OROMUCOSAL | Status: DC | PRN
  Filled 2018-07-05: qty 177

## 2018-07-05 SURGICAL SUPPLY — 71 items
BATTERY INSTRU NAVIGATION (MISCELLANEOUS) ×12 IMPLANT
BLADE SAGITTAL 25.0X1.19X90 (BLADE) IMPLANT
BLADE SAGITTAL 25.0X1.19X90MM (BLADE)
BLADE SAW 1/2 (BLADE) ×3 IMPLANT
BLADE SAW 70X12.5 (BLADE) IMPLANT
BLADE SAW 90X13X1.19 OSCILLAT (BLADE) ×3 IMPLANT
BONE CEMENT GENTAMICIN (Cement) ×6 IMPLANT
CANISTER SUCT 1200ML W/VALVE (MISCELLANEOUS) ×3 IMPLANT
CANISTER SUCT 3000ML PPV (MISCELLANEOUS) ×6 IMPLANT
CAPT KNEE TOTAL 3 ATTUNE ×3 IMPLANT
CEMENT BONE GENTAMICIN 40 (Cement) ×2 IMPLANT
COOLER POLAR GLACIER W/PUMP (MISCELLANEOUS) ×3 IMPLANT
CUFF TOURN 24 STER (MISCELLANEOUS) IMPLANT
CUFF TOURN 30 STER DUAL PORT (MISCELLANEOUS) IMPLANT
DRAPE SHEET LG 3/4 BI-LAMINATE (DRAPES) ×3 IMPLANT
DRSG DERMACEA 8X12 NADH (GAUZE/BANDAGES/DRESSINGS) ×3 IMPLANT
DRSG OPSITE POSTOP 4X14 (GAUZE/BANDAGES/DRESSINGS) ×3 IMPLANT
DRSG TEGADERM 4X4.75 (GAUZE/BANDAGES/DRESSINGS) ×3 IMPLANT
DURAPREP 26ML APPLICATOR (WOUND CARE) ×6 IMPLANT
ELECT CAUTERY BLADE 6.4 (BLADE) ×3 IMPLANT
ELECT REM PT RETURN 9FT ADLT (ELECTROSURGICAL) ×3
ELECTRODE REM PT RTRN 9FT ADLT (ELECTROSURGICAL) ×1 IMPLANT
EX-PIN ORTHOLOCK NAV 4X150 (PIN) ×6 IMPLANT
GLOVE BIOGEL M STRL SZ7.5 (GLOVE) ×6 IMPLANT
GLOVE BIOGEL PI IND STRL 7.0 (GLOVE) ×5 IMPLANT
GLOVE BIOGEL PI IND STRL 9 (GLOVE) ×1 IMPLANT
GLOVE BIOGEL PI INDICATOR 7.0 (GLOVE) ×10
GLOVE BIOGEL PI INDICATOR 9 (GLOVE) ×2
GLOVE INDICATOR 8.0 STRL GRN (GLOVE) ×3 IMPLANT
GLOVE SURG SYN 9.0  PF PI (GLOVE) ×2
GLOVE SURG SYN 9.0 PF PI (GLOVE) ×1 IMPLANT
GOWN STRL REUS W/ TWL LRG LVL3 (GOWN DISPOSABLE) ×3 IMPLANT
GOWN STRL REUS W/TWL 2XL LVL3 (GOWN DISPOSABLE) ×3 IMPLANT
GOWN STRL REUS W/TWL LRG LVL3 (GOWN DISPOSABLE) ×6
HEMOVAC 400CC 10FR (MISCELLANEOUS) ×3 IMPLANT
HOLDER FOLEY CATH W/STRAP (MISCELLANEOUS) ×3 IMPLANT
HOOD PEEL AWAY FLYTE STAYCOOL (MISCELLANEOUS) ×6 IMPLANT
KIT TURNOVER KIT A (KITS) ×3 IMPLANT
KNIFE SCULPS 14X20 (INSTRUMENTS) ×3 IMPLANT
LABEL OR SOLS (LABEL) ×3 IMPLANT
NDL SAFETY ECLIPSE 18X1.5 (NEEDLE) ×1 IMPLANT
NEEDLE HYPO 18GX1.5 SHARP (NEEDLE) ×2
NEEDLE SPNL 20GX3.5 QUINCKE YW (NEEDLE) ×6 IMPLANT
NS IRRIG 500ML POUR BTL (IV SOLUTION) ×3 IMPLANT
PACK TOTAL KNEE (MISCELLANEOUS) ×3 IMPLANT
PAD WRAPON POLAR KNEE (MISCELLANEOUS) IMPLANT
PAD WRAPON POLOR MULTI XL (MISCELLANEOUS) ×1 IMPLANT
PIN DRILL QUICK PACK ×3 IMPLANT
PIN FIXATION 1/8DIA X 3INL (PIN) ×9 IMPLANT
PULSAVAC PLUS IRRIG FAN TIP (DISPOSABLE) ×3
SOL .9 NS 3000ML IRR  AL (IV SOLUTION) ×2
SOL .9 NS 3000ML IRR UROMATIC (IV SOLUTION) ×1 IMPLANT
SOL PREP PVP 2OZ (MISCELLANEOUS) ×3
SOLUTION PREP PVP 2OZ (MISCELLANEOUS) ×1 IMPLANT
SPONGE DRAIN TRACH 4X4 STRL 2S (GAUZE/BANDAGES/DRESSINGS) ×3 IMPLANT
STAPLER SKIN PROX 35W (STAPLE) ×3 IMPLANT
STRAP TIBIA SHORT (MISCELLANEOUS) ×3 IMPLANT
SUCTION FRAZIER HANDLE 10FR (MISCELLANEOUS) ×2
SUCTION TUBE FRAZIER 10FR DISP (MISCELLANEOUS) ×1 IMPLANT
SUT VIC AB 0 CT1 36 (SUTURE) ×3 IMPLANT
SUT VIC AB 1 CT1 36 (SUTURE) ×6 IMPLANT
SUT VIC AB 2-0 CT2 27 (SUTURE) ×3 IMPLANT
SYR 20CC LL (SYRINGE) ×3 IMPLANT
SYR 30ML LL (SYRINGE) ×6 IMPLANT
TIP FAN IRRIG PULSAVAC PLUS (DISPOSABLE) ×1 IMPLANT
TOWEL OR 17X26 4PK STRL BLUE (TOWEL DISPOSABLE) ×3 IMPLANT
TOWER CARTRIDGE SMART MIX (DISPOSABLE) ×3 IMPLANT
TRAY FOLEY MTR SLVR 16FR STAT (SET/KITS/TRAYS/PACK) ×3 IMPLANT
WRAP-ON POLOR PAD MULTI XL (MISCELLANEOUS) ×1
WRAPON POLAR PAD KNEE (MISCELLANEOUS)
WRAPON POLOR PAD MULTI XL (MISCELLANEOUS) ×2

## 2018-07-05 NOTE — Op Note (Signed)
OPERATIVE NOTE  DATE OF SURGERY:  07/05/2018  PATIENT NAME:  Jennifer Mcgee   DOB: 1941-01-23  MRN: [id number]  PRE-OPERATIVE DIAGNOSIS: Degenerative arthrosis of the left knee, primary  POST-OPERATIVE DIAGNOSIS:  Same  PROCEDURE:  Left total knee arthroplasty using computer-assisted navigation  SURGEON:  Marciano Sequin. M.D.  ASSISTANT:  Vance Peper, PA (present and scrubbed throughout the case, critical for assistance with exposure, retraction, instrumentation, and closure)  ANESTHESIA: spinal  ESTIMATED BLOOD LOSS: 50 mL  FLUIDS REPLACED: 1100 mL of crystalloid  TOURNIQUET TIME: 87 minutes  DRAINS: 2 medium Hemovac drains  SOFT TISSUE RELEASES: Anterior cruciate ligament, posterior cruciate ligament, deep medial collateral ligament, patellofemoral ligament  IMPLANTS UTILIZED: DePuy Attune size 4N posterior stabilized femoral component (cemented), size 3 rotating platform tibial component (cemented), 35 mm medialized dome patella (cemented), and a 6 mm stabilized rotating platform polyethylene insert.  INDICATIONS FOR SURGERY: Jennifer Mcgee is a 77 y.o. year old female with a long history of progressive knee pain. X-rays demonstrated severe degenerative changes in tricompartmental fashion. The patient had not seen any significant improvement despite conservative nonsurgical intervention. After discussion of the risks and benefits of surgical intervention, the patient expressed understanding of the risks benefits and agree with plans for total knee arthroplasty.   The risks, benefits, and alternatives were discussed at length including but not limited to the risks of infection, bleeding, nerve injury, stiffness, blood clots, the need for revision surgery, cardiopulmonary complications, among others, and they were willing to proceed.  PROCEDURE IN DETAIL: The patient was brought into the operating room and, after adequate spinal anesthesia was achieved, a tourniquet was placed  on the patient's upper thigh. The patient's knee and leg were cleaned and prepped with alcohol and DuraPrep and draped in the usual sterile fashion. A "timeout" was performed as per usual protocol. The lower extremity was exsanguinated using an Esmarch, and the tourniquet was inflated to 300 mmHg. An anterior longitudinal incision was made followed by a standard mid vastus approach. The deep fibers of the medial collateral ligament were elevated in a subperiosteal fashion off of the medial flare of the tibia so as to maintain a continuous soft tissue sleeve. The patella was subluxed laterally and the patellofemoral ligament was incised. Inspection of the knee demonstrated severe degenerative changes with full-thickness loss of articular cartilage. Osteophytes were debrided using a rongeur. Anterior and posterior cruciate ligaments were excised. Two 4.0 mm Schanz pins were inserted in the femur and into the tibia for attachment of the array of trackers used for computer-assisted navigation. Hip center was identified using a circumduction technique. Distal landmarks were mapped using the computer. The distal femur and proximal tibia were mapped using the computer. The distal femoral cutting guide was positioned using computer-assisted navigation so as to achieve a 5 distal valgus cut. The femur was sized and it was felt that a size 4N femoral component was appropriate. A size 4 femoral cutting guide was positioned and the anterior cut was performed and verified using the computer. This was followed by completion of the posterior and chamfer cuts. Femoral cutting guide for the central box was then positioned in the center box cut was performed.  Attention was then directed to the proximal tibia. Medial and lateral menisci were excised. The extramedullary tibial cutting guide was positioned using computer-assisted navigation so as to achieve a 0 varus-valgus alignment and 3 posterior slope. The cut was performed  and verified using the computer. The proximal tibia  was sized and it was felt that a size 3 tibial tray was appropriate. Tibial and femoral trials were inserted followed by insertion of a 6 mm polyethylene insert. This allowed for excellent mediolateral soft tissue balancing both in flexion and in full extension. Finally, the patella was cut and prepared so as to accommodate a 35 mm medialized dome patella. A patella trial was placed and the knee was placed through a range of motion with excellent patellar tracking appreciated. The femoral trial was removed after debridement of posterior osteophytes. The central post-hole for the tibial component was reamed followed by insertion of a keel punch. Tibial trials were then removed. Cut surfaces of bone were irrigated with copious amounts of normal saline with antibiotic solution using pulsatile lavage and then suctioned dry. Polymethylmethacrylate cement with gentamicin was prepared in the usual fashion using a vacuum mixer. Cement was applied to the cut surface of the proximal tibia as well as along the undersurface of a size 3 rotating platform tibial component. Tibial component was positioned and impacted into place. Excess cement was removed using [employer] fast streamer. Cement was then applied to the cut surfaces of the femur as well as along the posterior flanges of the size 4N femoral component. The femoral component was positioned and impacted into place. Excess cement was removed using [employer] fast streamer. A 6 mm polyethylene trial was inserted and the knee was brought into full extension with steady axial compression applied. Finally, cement was applied to the backside of a 35 mm medialized dome patella and the patellar component was positioned and patellar clamp applied. Excess cement was removed using [employer] fast streamer. After adequate curing of the cement, the tourniquet was deflated after a total tourniquet time of 87 minutes. Hemostasis was achieved using  electrocautery. The knee was irrigated with copious amounts of normal saline with antibiotic solution using pulsatile lavage and then suctioned dry. 20 mL of 1.3% Exparel and 60 mL of 0.25% Marcaine in 40 mL of normal saline was injected along the posterior capsule, medial and lateral gutters, and along the arthrotomy site. A 6 mm stabilized rotating platform polyethylene insert was inserted and the knee was placed through a range of motion with excellent mediolateral soft tissue balancing appreciated and excellent patellar tracking noted. 2 medium drains were placed in the wound bed and brought out through separate stab incisions. The medial parapatellar portion of the incision was reapproximated using interrupted sutures of #1 Vicryl. Subcutaneous tissue was approximated in layers using first #0 Vicryl followed #2-0 Vicryl. The skin was approximated with skin staples. A sterile dressing was applied.  The patient tolerated the procedure well and was transported to the recovery room in stable condition.    Kelvyn Schunk P. Holley Bouche., M.D.

## 2018-07-05 NOTE — Anesthesia Post-op Follow-up Note (Signed)
Anesthesia QCDR form completed.        

## 2018-07-05 NOTE — Transfer of Care (Signed)
Immediate Anesthesia Transfer of Care Note  Patient: Jennifer Mcgee  Procedure(s) Performed: COMPUTER ASSISTED TOTAL KNEE ARTHROPLASTY (Left Knee)  Patient Location: PACU  Anesthesia Type:Spinal  Level of Consciousness: awake and patient cooperative  Airway & Oxygen Therapy: Patient Spontanous Breathing and Patient connected to nasal cannula oxygen  Post-op Assessment: Report given to RN and Post -op Vital signs reviewed and stable  Post vital signs: Reviewed and stable  Last Vitals:  Vitals Value Taken Time  BP    Temp    Pulse 90 07/05/2018  2:32 PM  Resp 18 07/05/2018  2:32 PM  SpO2 96 % 07/05/2018  2:32 PM  Vitals shown include unvalidated device data.  Last Pain:  Vitals:   07/05/18 0912  TempSrc: Oral  PainSc: 3          Complications: No apparent anesthesia complications

## 2018-07-05 NOTE — NC FL2 (Signed)
[place] LEVEL OF CARE SCREENING TOOL     IDENTIFICATION  Patient Name: Jennifer Mcgee Birthdate: 1941/04/16 Sex: female Admission Date (Current Location): 07/05/2018  [place] and [place] Number:  Engineering geologist and Address:  [facility], [address], [place], [place] [address]      Provider Number: [id number]  Attending Physician Name and Address:  Dereck Leep, MD  Relative Name and Phone Number:       Current Level of Care: Hospital Recommended Level of Care: [facility] Prior Approval Number:    Date Approved/Denied:   PASRR Number: [id number])  Discharge Plan: SNF    Current Diagnoses: Patient Active Problem List   Diagnosis Date Noted  . S/P total knee arthroplasty 07/05/2018  . Bronchitis 09/30/2015  . Adaptation reaction 04/03/2015  . At risk of disease 04/03/2015  . 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 Back pain, chronic 04/03/2015  . Colon polyp 04/03/2015  . Diabetes ([place]) 04/03/2015  . 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 Gout 04/03/2015  . Blood in the urine 04/03/2015  . Calcium blood increased 04/03/2015  . Hypercholesteremia 04/03/2015  . High sodium levels 04/03/2015  . BP (high blood pressure) 04/03/2015  . 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 Arthritis, degenerative 04/03/2015  . RAD (reactive airway disease) 04/03/2015  . Diverticulitis of colon without hemorrhage 04/02/2013  . Personal history of colonic polyps 03/09/2013    Orientation RESPIRATION BLADDER Height & Weight     Self, Time, Situation, Place  Normal Continent Weight: 92.6 kg (204 lb 1.6 oz) Height:  5\' 3"  (160 cm)  BEHAVIORAL SYMPTOMS/MOOD NEUROLOGICAL BOWEL NUTRITION STATUS      Continent Diet(Diet: Clear liquid to be advanced. )  AMBULATORY STATUS COMMUNICATION OF NEEDS Skin   Extensive Assist Verbally Surgical wounds(Incision: Left Knee. )                       Personal Care Assistance Level of  Assistance  Bathing, Feeding, Dressing Bathing Assistance: Limited assistance Feeding assistance: Independent Dressing Assistance: Limited assistance     Functional Limitations Info  Sight, Hearing, Speech Sight Info: Adequate Hearing Info: Adequate Speech Info: Adequate    SPECIAL CARE FACTORS FREQUENCY  PT (By licensed PT), OT (By licensed OT)     PT Frequency: (5) OT Frequency: (5)            Contractures      Additional Factors Info  Code Status, Allergies Code Status Info: (Full Code. ) Allergies Info: (Epinephrine, Penicillins, Levaquin Levofloxacin In D5w)           Current Medications (07/05/2018):  This is the current hospital active medication list Current Facility-Administered Medications  Medication Dose Route Frequency Provider Last Rate Last Dose  . 0.9 %  sodium chloride infusion   Intravenous Continuous Hooten, Laurice Record, MD      . acetaminophen (OFIRMEV) IV 1,000 mg  1,000 mg Intravenous Q6H Hooten, Laurice Record, MD      . Derrill Memo ON 07/06/2018] acetaminophen (TYLENOL) tablet 325-650 mg  325-650 mg Oral Q6H PRN Hooten, Laurice Record, MD      . alum & mag hydroxide-simeth (MAALOX/MYLANTA) 200-200-20 MG/5ML suspension 30 mL  30 mL Oral Q4H PRN Hooten, Laurice Record, MD      . atorvastatin (LIPITOR) tablet 20 mg  20 mg Oral QHS Hooten, Laurice Record, MD      . bisacodyl (DULCOLAX) suppository 10 mg  10 mg Rectal Daily PRN  Hooten, Laurice Record, MD      . celecoxib (CELEBREX) capsule 200 mg  200 mg Oral BID Hooten, Laurice Record, MD      . cholecalciferol (VITAMIN D) tablet 2,000 Units  2,000 Units Oral Daily Hooten, Laurice Record, MD      . clindamycin (CLEOCIN) IVPB 600 mg  600 mg Intravenous Q6H Hooten, Laurice Record, MD      . diphenhydrAMINE (BENADRYL) 12.5 MG/5ML elixir 12.5-25 mg  12.5-25 mg Oral Q4H PRN Hooten, Laurice Record, MD      . Derrill Memo ON 07/06/2018] enoxaparin (LOVENOX) injection 30 mg  30 mg Subcutaneous Q12H Hooten, Laurice Record, MD      . famotidine (PEPCID) 20 MG tablet           . [START ON  07/06/2018] ferrous sulfate tablet 325 mg  325 mg Oral Q breakfast Hooten, Laurice Record, MD      . gabapentin (NEURONTIN) capsule 300 mg  300 mg Oral QHS Hooten, Laurice Record, MD      . Derrill Memo ON 07/06/2018] glipiZIDE (GLUCOTROL XL) 24 hr tablet 2.5 mg  2.5 mg Oral Q breakfast Hooten, Laurice Record, MD      . hydrochlorothiazide (MICROZIDE) capsule 12.5 mg  12.5 mg Oral Daily Hooten, Laurice Record, MD      . HYDROmorphone (DILAUDID) injection 0.5-1 mg  0.5-1 mg Intravenous Q4H PRN Hooten, Laurice Record, MD      . insulin aspart (novoLOG) injection 0-15 Units  0-15 Units Subcutaneous TID WC Hooten, Laurice Record, MD      . magnesium gluconate (MAGONATE) tablet 500 mg  500 mg Oral Daily Hooten, Laurice Record, MD      . magnesium hydroxide (MILK OF MAGNESIA) suspension 30 mL  30 mL Oral Daily Hooten, Laurice Record, MD      . menthol-cetylpyridinium (CEPACOL) lozenge 3 mg  1 lozenge Oral PRN Hooten, Laurice Record, MD       Or  . phenol (CHLORASEPTIC) mouth spray 1 spray  1 spray Mouth/Throat PRN Hooten, Laurice Record, MD      . metFORMIN (GLUCOPHAGE) tablet 1,000 mg  1,000 mg Oral BID WC Hooten, Laurice Record, MD      . metoCLOPramide (REGLAN) tablet 5-10 mg  5-10 mg Oral Q8H PRN Hooten, Laurice Record, MD       Or  . metoCLOPramide (REGLAN) injection 5-10 mg  5-10 mg Intravenous Q8H PRN Hooten, Laurice Record, MD      . metoCLOPramide (REGLAN) tablet 10 mg  10 mg Oral TID AC & HS Hooten, Laurice Record, MD      . multivitamin with minerals tablet 1 tablet  1 tablet Oral Daily Hooten, Laurice Record, MD      . multivitamin-lutein (OCUVITE-LUTEIN) capsule 1 capsule  1 capsule Oral BID Hooten, Laurice Record, MD      . ondansetron (ZOFRAN) tablet 4 mg  4 mg Oral Q6H PRN Hooten, Laurice Record, MD       Or  . ondansetron (ZOFRAN) injection 4 mg  4 mg Intravenous Q6H PRN Hooten, Laurice Record, MD      . oxyCODONE (Oxy IR/ROXICODONE) immediate release tablet 10 mg  10 mg Oral Q4H PRN Hooten, Laurice Record, MD      . oxyCODONE (Oxy IR/ROXICODONE) immediate release tablet 5 mg  5 mg Oral Q4H PRN Hooten, Laurice Record, MD      .  pantoprazole (PROTONIX) EC tablet 40 mg  40 mg Oral BID AC Hooten, Laurice Record, MD      .  quinapril (ACCUPRIL) tablet 40 mg  40 mg Oral Daily Hooten, Laurice Record, MD      . senna-docusate (Senokot-S) tablet 1 tablet  1 tablet Oral BID Hooten, Laurice Record, MD      . sodium phosphate (FLEET) 7-19 GM/118ML enema 1 enema  1 enema Rectal Once PRN Hooten, Laurice Record, MD      . traMADol Veatrice Bourbon) tablet 50-100 mg  50-100 mg Oral Q4H PRN Hooten, Laurice Record, MD      . vitamin B-12 (CYANOCOBALAMIN) tablet 1,000 mcg  1,000 mcg Oral Daily Hooten, Laurice Record, MD         Discharge Medications: Please see discharge summary for a list of discharge medications.  Relevant Imaging Results:  Relevant Lab Results:   Additional Information (SSN: [id number])  Philis Doke, Veronia Beets, LCSW

## 2018-07-05 NOTE — H&P (Signed)
The patient has been re-examined, and the chart reviewed, and there have been no interval changes to the documented history and physical.    The risks, benefits, and alternatives have been discussed at length. The patient expressed understanding of the risks benefits and agreed with plans for surgical intervention.  James P. Hooten, Jr. M.D.    

## 2018-07-05 NOTE — Anesthesia Preprocedure Evaluation (Signed)
Anesthesia Evaluation  Patient identified by MRN, date of birth, ID band Patient awake    Reviewed: Allergy & Precautions, H&P , NPO status , Patient's Chart, lab work & pertinent test results, reviewed documented beta blocker date and time   Airway Mallampati: II   Neck ROM: full    Dental  (+) Poor Dentition   Pulmonary neg pulmonary ROS,  Hx of restrictive airway disease   Pulmonary exam normal        Cardiovascular Exercise Tolerance: Poor hypertension, On Medications + Peripheral Vascular Disease  negative cardio ROS Normal cardiovascular exam Rhythm:regular Rate:Normal     Neuro/Psych Previous back surgery L4L5 with intermittent low back pain.  negative neurological ROS  negative psych ROS   GI/Hepatic negative GI ROS, Neg liver ROS,   Endo/Other  negative endocrine ROSdiabetes, Well Controlled, Type 2, Oral Hypoglycemic Agents  Renal/GU negative Renal ROS  negative genitourinary   Musculoskeletal   Abdominal   Peds  Hematology negative hematology ROS (+) anemia ,   Anesthesia Other Findings Past Medical History: No date: Anemia No date: Arthritis     Comment:  degenerative arthritis left knee No date: Chicken pox No date: Colon polyp No date: Diabetes mellitus without complication (HCC) No date: Hyperlipidemia No date: Hypertension No date: Macular degeneration Past Surgical History: 1981: ABDOMINAL HYSTERECTOMY 07/2004: APPENDECTOMY     Comment:  Dr. Jamal Collin No date: BACK SURGERY     Comment:  L4-5; fusion No date: BREAST SURGERY     Comment:  biopsy- benign 1977: BREAST SURGERY     Comment:  duct operation No date: CATARACT EXTRACTION W/ INTRAOCULAR LENS  IMPLANT, BILATERAL 03-28-13: COLONOSCOPY     Comment:  Dr Jamal Collin 04/28/2016: COLONOSCOPY WITH PROPOFOL; N/A     Comment:  Procedure: COLONOSCOPY WITH PROPOFOL;  Surgeon:               Christene Lye, MD;  Location: [facility] ENDOSCOPY;                 Service: Endoscopy;  Laterality: N/A; 01/20/1973: EXCISION / BIOPSY BREAST / NIPPLE / DUCT; Left     Comment:  milk duct removed 2013: EYE SURGERY; Bilateral     Comment:  cataract BMI    Body Mass Index:  37.38 kg/m     Reproductive/Obstetrics negative OB ROS                             Anesthesia Physical Anesthesia Plan  ASA: III  Anesthesia Plan: General and Spinal   Post-op Pain Management:    Induction:   PONV Risk Score and Plan:   Airway Management Planned:   Additional Equipment:   Intra-op Plan:   Post-operative Plan:   Informed Consent: I have reviewed the patients History and Physical, chart, labs and discussed the procedure including the risks, benefits and alternatives for the proposed anesthesia with the patient or authorized representative who has indicated his/her understanding and acceptance.   Dental Advisory Given  Plan Discussed with: CRNA  Anesthesia Plan Comments:         Anesthesia Quick Evaluation

## 2018-07-05 NOTE — Progress Notes (Signed)
Patient admitted to room. Patient is alert and oriented x 4. Pt denies pain to the knee. However, verbalizes discomfort to L eye. Dr Marry Guan made aware. Artificial tears ordered. Bone foam, TED and foot pumps in place. Bed to lower level. Patient educated about room and patient safety.

## 2018-07-05 NOTE — OR Nursing (Signed)
Lab tech in for abo/rh draw @ [phone]

## 2018-07-05 NOTE — Anesthesia Procedure Notes (Signed)
Spinal  Patient location during procedure: OR Start time: 07/05/2018 11:24 AM End time: 07/05/2018 11:26 AM Staffing Resident/CRNA: Bernardo Heater, CRNA Performed: resident/CRNA  Preanesthetic Checklist Completed: patient identified, site marked, surgical consent, pre-op evaluation, timeout performed, IV checked, risks and benefits discussed and monitors and equipment checked Spinal Block Patient position: sitting Prep: ChloraPrep Patient monitoring: heart rate, continuous pulse ox, blood pressure and cardiac monitor Approach: midline Location: L5-S1 Injection technique: single-shot Needle Needle type: Introducer and Pencan  Needle gauge: 24 G Needle length: 9 cm Additional Notes Negative paresthesia. Negative blood return. Positive free-flowing CSF. Expiration date of kit checked and confirmed. Patient tolerated procedure well, without complications.

## 2018-07-06 LAB — GLUCOSE, CAPILLARY
Glucose-Capillary: 121 mg/dL — ABNORMAL HIGH (ref 70–99)
Glucose-Capillary: 139 mg/dL — ABNORMAL HIGH (ref 70–99)
Glucose-Capillary: 129 mg/dL — ABNORMAL HIGH (ref 70–99)
Glucose-Capillary: 128 mg/dL — ABNORMAL HIGH (ref 70–99)

## 2018-07-06 MED ORDER — OXYCODONE HCL 5 MG PO TABS: 5.0000 mg | ORAL_TABLET | ORAL | 0 refills | Status: DC | PRN

## 2018-07-06 MED ORDER — TRAMADOL HCL 50 MG PO TABS: 50.0000 mg | ORAL_TABLET | Freq: Four times a day (QID) | ORAL | 0 refills | Status: DC | PRN

## 2018-07-06 MED ORDER — ENOXAPARIN SODIUM 40 MG/0.4ML ~~LOC~~ SOLN: 40.0000 mg | SUBCUTANEOUS | 0 refills | Status: DC

## 2018-07-06 NOTE — Evaluation (Signed)
Occupational Therapy Evaluation Patient Details Name: Jennifer Mcgee MRN: [id number] DOB: Dec 05, 1941 Today's Date: 07/06/2018    History of Present Illness Pt. is a 77 y.o female who ws admitted to [facility] for a Left TKR. Pt. PMHx includes: HTN, DM II, anemia, macular degeneration, L4-L5 fusion.   Clinical Impression   Pt. presents with 2/10 left knee pain,  weakness, limited activity tolerance, and limited functional mobility which limits her ability to complete basic ADL and IADL functioning. Pt. resides at home with her husband. Pt. was independent with ADLs, and IADL functioning: including meal preparation, and medication management. Pt. no longer drives.  Pt. education was provided about A/E use for LE ADLs. Pt. reports having a reacher at home. Pt. could benefit from OT services for ADL training, A/E training, and pt. education about home modification, and DME. Pt. could benefit from a transfer tub bench as she will be using a bathroom with a tub shower during her recovery period. Pt. Plans to return home upon discharge, with family assistance as needed. No follow-up OT services are warranted at this time.    Follow Up Recommendations  No follow-up Occupational Therapy   Equipment Recommendations   Transfer Tub bench   Recommendations for Other Services       Precautions / Restrictions Precautions Precautions: Knee Precaution Booklet Issued: Yes (comment) Restrictions Weight Bearing Restrictions: Yes LLE Weight Bearing: Weight bearing as tolerated      Mobility Bed Mobility     Supine to sit: Supervision     General bed mobility comments: Pt. up in recliner chair, just finished with PT upon arrival  Transfers                                 ADL either performed or assessed with clinical judgement   ADL Overall ADL's : Needs assistance/impaired Eating/Feeding: Independent;Set up   Grooming: Independent;Set up;Sitting   Upper Body Bathing: Set  up;Independent;Sitting   Lower Body Bathing: Set up;Moderate assistance   Upper Body Dressing : Set up;Independent   Lower Body Dressing: Set up;Moderate assistance               Functional mobility during ADLs: Min guard General ADL Comments: Pt. education was provided about A/E use for LE ADLs.     Vision Baseline Vision/History: No visual deficits Patient Visual Report: No change from baseline       Perception     Praxis      Pertinent Vitals/Pain Pain Assessment: 0-10 Pain Score: 2  Pain Location: Left Knee Pain Descriptors / Indicators: Aching Pain Intervention(s): Monitored during session;Premedicated before session;Limited activity within patient's tolerance;Repositioned;Ice applied     Hand Dominance Right   Extremity/Trunk Assessment Upper Extremity Assessment Upper Extremity Assessment: Overall WFL for tasks assessed       Communication Communication Communication: No difficulties   Cognition Arousal/Alertness: Awake/alert Behavior During Therapy: WFL for tasks assessed/performed Overall Cognitive Status: Within Functional Limits for tasks assessed                                     General Comments       Exercises   Shoulder Instructions      Home Living Family/patient expects to be discharged to:: Private residence Living Arrangements: Spouse/significant other Available Help at Discharge: Family;Available 24 hours/day Type of Home: House Home Access:  Stairs to enter [employer] of Steps: 5 Entrance Stairs-Rails: Can reach both Home Layout: Two level;Able to live on main level with bedroom/bathroom Alternate Level Stairs-Number of Steps: 14 Alternate Level Stairs-Rails: Right;Left Bathroom Shower/Tub: Tub/shower unit;Walk-in shower(Pt. will have a tub shower downstairs wher she will be staying.. Typically uses a walkin shower.)   Bathroom Toilet: Handicapped height Bathroom Accessibility: No   Home  Equipment: Walker - 2 wheels;Walker - 4 wheels;Cane - single point          Prior Functioning/Environment Level of Independence: Independent        Comments: using cane for ambulation, holds on to husband        OT Problem List: Decreased strength;Decreased activity tolerance;Decreased knowledge of use of DME or AE;Pain      OT Treatment/Interventions: Self-care/ADL training;Therapeutic exercise;Patient/family education;DME and/or AE instruction;Therapeutic activities    OT Goals(Current goals can be found in the care plan section) Acute Rehab OT Goals Patient Stated Goal: To return home OT Goal Formulation: With patient Potential to Achieve Goals: Good  OT Frequency: Min 2X/week   Barriers to D/C:            Co-evaluation              AM-PAC PT "6 Clicks" Daily Activity     Outcome Measure Help from another person eating meals?: None Help from another person taking care of personal grooming?: None Help from another person toileting, which includes using toliet, bedpan, or urinal?: A Little Help from another person bathing (including washing, rinsing, drying)?: A Lot Help from another person to put on and taking off regular upper body clothing?: None Help from another person to put on and taking off regular lower body clothing?: A Lot 6 Click Score: 19   End of Session    Activity Tolerance: Patient tolerated treatment well Patient left: in chair  OT Visit Diagnosis: Muscle weakness (generalized) (M62.81)                Time: 1696-7893 OT Time Calculation (min): 19 min Charges:  OT General Charges $OT Visit: 1 Visit OT Evaluation $OT Eval Low Complexity: 1 Low  Harrel Carina, MS, OTR/L   Harrel Carina 07/06/2018, 9:56 AM

## 2018-07-06 NOTE — Progress Notes (Signed)
Physical Therapy Treatment Patient Details Name: Jennifer Mcgee MRN: [id number] DOB: 09-04-1941 Today's Date: 07/06/2018    History of Present Illness Pt. is a 77 y.o female who ws admitted to [facility] for a Left TKR. Pt. PMHx includes: HTN, DM II, anemia, macular degeneration, L4-L5 fusion.    PT Comments    Patient up in chair, alert, agreeable to PT. Patient able to demonstrate most exercises without cues from PT, new exercises patient needed minimal verbal cues to perform properly. Improvement in sit <> stand transfers with RW, less cues for hand placement/technique needed. Patient ambulated ~235ft with RW, step to gait pattern, CGA and less fatigue noted from last session. The patient returned to supine with HOB elevated with supervision, all needs in reach and visitors/family at bedside.     Follow Up Recommendations  Home health PT     Equipment Recommendations  None recommended by PT(Patient has RW that was her mothers)    Recommendations for Other Services       Precautions / Restrictions Precautions Precautions: Knee Restrictions Weight Bearing Restrictions: Yes LLE Weight Bearing: Weight bearing as tolerated    Mobility  Bed Mobility Overal bed mobility: Needs Assistance Bed Mobility: Sit to Supine       Sit to supine: Supervision      Transfers Overall transfer level: Needs assistance Equipment used: Rolling walker (2 wheeled) Transfers: Sit to/from Stand Sit to Stand: Min guard            Ambulation/Gait   Gait Distance (Feet): 230 Feet Assistive device: Rolling walker (2 wheeled)       General Gait Details: Patient demonstrated improved gait speed and step through gait pattern throughout ambulation, less fatigue then last session, no standing rest breaks needed.   Stairs             Wheelchair Mobility    Modified Rankin (Stroke Patients Only)       Balance                                             Cognition Arousal/Alertness: Awake/alert Behavior During Therapy: WFL for tasks assessed/performed                                          Exercises Total Joint Exercises Ankle Circles/Pumps: AROM;20 reps Quad Sets: AROM;20 reps Short Arc Quad: AROM;Left;20 reps Hip ABduction/ADduction: AROM;Left;20 reps Long Arc Quad: AROM;Left;15 reps Knee Flexion: AROM;Left;20 reps Goniometric ROM: L knee extension: 0 deg, L knee flexion: 60deg    General Comments        Pertinent Vitals/Pain Pain Assessment: 0-10 Pain Score: 2  Pain Location: Left Knee Pain Descriptors / Indicators: Aching Pain Intervention(s): Monitored during session;Repositioned;Limited activity within patient's tolerance    Home Living                      Prior Function            PT Goals (current goals can now be found in the care plan section) Progress towards PT goals: Progressing toward goals    Frequency    BID      PT Plan Current plan remains appropriate    Co-evaluation  AM-PAC PT "6 Clicks" Daily Activity  Outcome Measure  Difficulty turning over in bed (including adjusting bedclothes, sheets and blankets)?: A Little Difficulty moving from lying on back to sitting on the side of the bed? : A Little Difficulty sitting down on and standing up from a chair with arms (e.g., wheelchair, bedside commode, etc,.)?: A Little Help needed moving to and from a bed to chair (including a wheelchair)?: A Little Help needed walking in hospital room?: A Little Help needed climbing 3-5 steps with a railing? : A Little 6 Click Score: 18    End of Session Equipment Utilized During Treatment: Gait belt Activity Tolerance: Patient tolerated treatment well Patient left: in bed;with bed alarm set;with SCD's reapplied;with family/visitor present Nurse Communication: Mobility status PT Visit Diagnosis: Unsteadiness on feet (R26.81);Other abnormalities of gait and  mobility (R26.89);Pain Pain - Right/Left: Left Pain - part of body: Knee     Time: 1430-1454 PT Time Calculation (min) (ACUTE ONLY): 24 min  Charges:  $Therapeutic Exercise: 8-22 mins $Therapeutic Activity: 8-22 mins                     Lieutenant Diego PT, DPT 3:02 PM,07/06/18 [phone]

## 2018-07-06 NOTE — Discharge Summary (Addendum)
Physician Discharge Summary  Patient ID: Jennifer Mcgee MRN: [id number] DOB/AGE: October 18, 1941 77 y.o.  Admit date: 07/05/2018 Discharge date: 07/07/2018  Admission Diagnoses:  primary osteoarthritis of left knee   Discharge Diagnoses: Patient Active Problem List   Diagnosis Date Noted  . S/P total knee arthroplasty 07/05/2018  . Bronchitis 09/30/2015  . Adaptation reaction 04/03/2015  . At risk of disease 04/03/2015  . 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 Back pain, chronic 04/03/2015  . Colon polyp 04/03/2015  . Diabetes ([place]) 04/03/2015  . 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 Gout 04/03/2015  . Blood in the urine 04/03/2015  . Calcium blood increased 04/03/2015  . Hypercholesteremia 04/03/2015  . High sodium levels 04/03/2015  . BP (high blood pressure) 04/03/2015  . 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 Arthritis, degenerative 04/03/2015  . RAD (reactive airway disease) 04/03/2015  . Diverticulitis of colon without hemorrhage 04/02/2013  . Personal history of colonic polyps 03/09/2013    Past Medical History:  Diagnosis Date  . Anemia   . Arthritis    degenerative arthritis left knee  . Chicken pox   . Colon polyp   . Diabetes mellitus without complication ([place])   . Hyperlipidemia   . Hypertension   . Macular degeneration      Transfusion: No transfusions during this admission   Consultants (if any):   Discharged Condition: Improved  Hospital Course: Jennifer Mcgee is an 77 y.o. female who was admitted 07/05/2018 with a diagnosis of degenerative arthrosis left knee and went to the operating room on 07/05/2018 and underwent the above named procedures.    Surgeries:Procedure(s): COMPUTER ASSISTED TOTAL KNEE ARTHROPLASTY on 07/05/2018  PRE-OPERATIVE DIAGNOSIS: Degenerative arthrosis of the left knee, primary  POST-OPERATIVE DIAGNOSIS:  Same  PROCEDURE:  Left total knee arthroplasty using computer-assisted navigation  SURGEON:  Marciano Sequin.  M.D.  ASSISTANT:  Vance Peper, PA (present and scrubbed throughout the case, critical for assistance with exposure, retraction, instrumentation, and closure)  ANESTHESIA: spinal  ESTIMATED BLOOD LOSS: 50 mL  FLUIDS REPLACED: 1100 mL of crystalloid  TOURNIQUET TIME: 87 minutes  DRAINS: 2 medium Hemovac drains  SOFT TISSUE RELEASES: Anterior cruciate ligament, posterior cruciate ligament, deep medial collateral ligament, patellofemoral ligament  IMPLANTS UTILIZED: DePuy Attune size 4N posterior stabilized femoral component (cemented), size 3 rotating platform tibial component (cemented), 35 mm medialized dome patella (cemented), and a 6 mm stabilized rotating platform polyethylene insert.  INDICATIONS FOR SURGERY: Jennifer Mcgee is a 77 y.o. year old female with a long history of progressive knee pain. X-rays demonstrated severe degenerative changes in tricompartmental fashion. The patient had not seen any significant improvement despite conservative nonsurgical intervention. After discussion of the risks and benefits of surgical intervention, the patient expressed understanding of the risks benefits and agree with plans for total knee arthroplasty.   The risks, benefits, and alternatives were discussed at length including but not limited to the risks of infection, bleeding, nerve injury, stiffness, blood clots, the need for revision surgery, cardiopulmonary complications, among others, and they were willing to    Patient tolerated the surgery well. No complications .Patient was taken to PACU where she was stabilized and then transferred to the orthopedic floor.  Patient started on Lovenox 30 mg q 12 hrs. Foot pumps applied bilaterally at 80 mm hgb. Heels elevated off bed with rolled towels. No evidence of DVT. Calves non tender. Negative Homan. Physical therapy started on day #1 for gait training and transfer with OT starting on  day #  1 for ADL and assisted devices. Patient has done  well with therapy. Ambulated greater than 200 feet upon being discharged.  Was able to ascend and descend 4 steps safely and independently  Patient's IV And Foley were discontinued on day #1 with Hemovac being discontinued on day #2. Dressing was changed on day 2 prior to patient being discharged   She was given perioperative antibiotics:  Anti-infectives (From admission, onward)   Start     Dose/Rate Route Frequency Ordered Stop   07/05/18 1600  clindamycin (CLEOCIN) IVPB 600 mg     600 mg 100 mL/hr over 30 Minutes Intravenous Every 6 hours 07/05/18 1553 07/06/18 1559   07/05/18 0600  clindamycin (CLEOCIN) IVPB 900 mg  Status:  Discontinued     900 mg 100 mL/hr over 30 Minutes Intravenous On call to O.R. 07/04/18 2152 07/05/18 5852    .  She was fitted with AV 1 compression foot pump devices, instructed on heel pumps, early ambulation, and fitted with TED stockings bilaterally for DVT prophylaxis.  She benefited maximally from the hospital stay and there were no complications.    Recent vital signs:  Vitals:   07/05/18 2325 07/06/18 0334  BP: (!) 106/51 118/60  Pulse: 78 71  Resp: 12 12  Temp: (!) 97.3 F (36.3 C) 97.7 F (36.5 C)  SpO2: 100% 100%    Recent laboratory studies:  Lab Results  Component Value Date   HGB 12.4 06/21/2018   HGB 11.5 (L) 08/31/2017   HGB 11.8 10/04/2016   Lab Results  Component Value Date   WBC 8.8 06/21/2018   PLT 364 06/21/2018   Lab Results  Component Value Date   INR 0.95 06/21/2018   Lab Results  Component Value Date   NA 145 06/21/2018   K 3.7 06/21/2018   CL 106 06/21/2018   CO2 26 06/21/2018   BUN 25 (H) 06/21/2018   CREATININE 0.94 06/21/2018   GLUCOSE 95 06/21/2018    Discharge Medications:   Allergies as of 07/06/2018      Reactions   Epinephrine Other (See Comments)   Feels jumpy Jerking movements all over the body.    Penicillins Itching, Other (See Comments)   Has patient had a PCN reaction causing immediate  rash, facial/tongue/throat swelling, SOB or lightheadedness with hypotension: Yes Has patient had a PCN reaction causing severe rash involving mucus membranes or skin necrosis: No Has patient had a PCN reaction that required hospitalization: No Has patient had a PCN reaction occurring within the last 10 years: No If all of the above answers are "NO", then may proceed with Cephalosporin use.   Levaquin [levofloxacin In D5w] Anxiety      Medication List    STOP taking these medications   aspirin 81 MG tablet   ibuprofen 600 MG tablet Commonly known as:  ADVIL,MOTRIN     TAKE these medications   ACCU-CHEK FASTCLIX LANCETS Misc Use to check fasting blood sugars once daily.   acetaminophen 325 MG tablet Commonly known as:  TYLENOL Take 650 mg by mouth every 6 (six) hours as needed.   atorvastatin 20 MG tablet Commonly known as:  LIPITOR TAKE 1 TABLET (20 MG TOTAL) BY MOUTH AT BEDTIME.   docusate sodium 100 MG capsule Commonly known as:  COLACE Take 100 mg by mouth daily.   enoxaparin 40 MG/0.4ML injection Commonly known as:  LOVENOX Inject 0.4 mLs (40 mg total) into the skin daily for 14 days. Start taking on:  07/08/2018  ferrous sulfate 325 (65 FE) MG tablet Take 325 mg by mouth daily with breakfast.   glipiZIDE 2.5 MG 24 hr tablet Commonly known as:  GLUCOTROL XL TAKE ONE TABLET BY MOUTH ONCE DAILY   glucose blood test strip Commonly known as:  ACCU-CHEK SMARTVIEW Check sugar once daily, DX E11.9   hydrochlorothiazide 12.5 MG capsule Commonly known as:  MICROZIDE TAKE 1 CAPSULE (12.5 MG TOTAL) BY MOUTH DAILY.   magnesium gluconate 500 MG tablet Commonly known as:  MAGONATE Take 500 mg by mouth daily.   metFORMIN 1000 MG tablet Commonly known as:  GLUCOPHAGE TAKE 1 TABLET (1,000 MG TOTAL) BY MOUTH 2 (TWO) TIMES DAILY WITH A MEAL.   multivitamin tablet Take 1 tablet by mouth daily.   oxyCODONE 5 MG immediate release tablet Commonly known as:  Oxy  IR/ROXICODONE Take 1 tablet (5 mg total) by mouth every 4 (four) hours as needed for moderate pain (pain score 4-6).   PRESERVISION AREDS 2 PO Take 1 capsule by mouth 2 (two) times daily.   quinapril 40 MG tablet Commonly known as:  ACCUPRIL Take 1 tablet (40 mg total) by mouth daily.   traMADol 50 MG tablet Commonly known as:  ULTRAM Take 1-2 tablets (50-100 mg total) by mouth every 6 (six) hours as needed for moderate pain.   vitamin B-12 1000 MCG tablet Commonly known as:  CYANOCOBALAMIN Take 1,000 mcg by mouth daily.   Vitamin D3 2000 units Tabs Take 2,000 Units by mouth daily.            Durable Medical Equipment  (From admission, onward)        Start     Ordered   07/05/18 1554  DME Walker rolling  Once    Question:  Patient needs a walker to treat with the following condition  Answer:  Total knee replacement status   07/05/18 1553   07/05/18 1554  DME Bedside commode  Once    Question:  Patient needs a bedside commode to treat with the following condition  Answer:  Total knee replacement status   07/05/18 1553      Diagnostic Studies: Dg Knee Left Port  Result Date: 07/05/2018 CLINICAL DATA:  Status post left knee replacement today. EXAM: PORTABLE LEFT KNEE - 1-2 VIEW COMPARISON:  Plain films left knee 08/27/2016. FINDINGS: New total knee arthroplasty is in place. The device is located. No fracture or other acute abnormality. Surgical drain and staples noted. IMPRESSION: Status post left knee replacement.  No acute finding. Electronically Signed   By: Inge Rise M.D.   On: 07/05/2018 15:00    Disposition:   Discharge Instructions    Increase activity slowly   Complete by:  As directed       Follow-up Information    Watt Climes, PA On 07/20/2018.   Specialty:  Physician Assistant Why:  at 9:15am Contact information: [place] [address] [phone]        Dereck Leep, MD On 08/31/2018.    Specialty:  Orthopedic Surgery Why:  at 3:00pm Contact information: [place] [address] [phone]            Signed: Watt Climes 07/06/2018, 7:53 AM

## 2018-07-06 NOTE — Progress Notes (Signed)
   Subjective: 1 Day Post-Op Procedure(s) (LRB): COMPUTER ASSISTED TOTAL KNEE ARTHROPLASTY (Left) Patient reports pain as 3 on 0-10 scale.   Patient is well, and has had no acute complaints or problems We will start therapy today.  Plan is to go Rehab after hospital stay. no nausea and no vomiting Patient denies any chest pains or shortness of breath. Objective: Vital signs in last 24 hours: Temp:  [97.3 F (36.3 C)-100.4 F (38 C)] 97.7 F (36.5 C) (08/01 0334) Pulse Rate:  [71-94] 71 (08/01 0334) Resp:  [12-18] 12 (08/01 0334) BP: (106-163)/(51-89) 118/60 (08/01 0334) SpO2:  [98 %-100 %] 100 % (08/01 0334) Weight:  [92.6 kg (204 lb 1.6 oz)] 92.6 kg (204 lb 1.6 oz) (07/31 0912) Heels are non tender and elevated off the bed using rolled towels along with bone foam under operative heel  Intake/Output from previous day: 07/31 0701 - 08/01 0700 In: 3031.7 [I.V.:2213.3; IV Piggyback:818.3] Out: 885 [Urine:805; Drains:30; Blood:50] Intake/Output this shift: No intake/output data recorded.  No results for input(s): HGB in the last 72 hours. No results for input(s): WBC, RBC, HCT, PLT in the last 72 hours. No results for input(s): NA, K, CL, CO2, BUN, CREATININE, GLUCOSE, CALCIUM in the last 72 hours. No results for input(s): LABPT, INR in the last 72 hours.  EXAM General - Patient is Alert, Appropriate and Oriented Extremity - Neurologically intact Neurovascular intact Sensation intact distally Intact pulses distally Dorsiflexion/Plantar flexion intact Compartment soft Dressing - dressing C/D/I Motor Function - intact, moving foot and toes well on exam.    Past Medical History:  Diagnosis Date  . Anemia   . Arthritis    degenerative arthritis left knee  . Chicken pox   . Colon polyp   . Diabetes mellitus without complication ([place])   . Hyperlipidemia   . Hypertension   . Macular degeneration     Assessment/Plan: 1 Day Post-Op Procedure(s) (LRB): COMPUTER ASSISTED  TOTAL KNEE ARTHROPLASTY (Left) Active Problems:   S/P total knee arthroplasty  Estimated body mass index is 36.15 kg/m as calculated from the following:   Height as of this encounter: 5\' 3"  (1.6 m).   Weight as of this encounter: 92.6 kg (204 lb 1.6 oz). Advance diet Up with therapy D/C IV fluids Plan for discharge tomorrow Discharge to SNF  Labs: None DVT Prophylaxis - Lovenox, Foot Pumps and TED hose Weight-Bearing as tolerated to left leg D/C O2 and Pulse OX and try on Room Air Begin working on bowel movement  Jon R. [place] 07/06/2018, 7:44 AM

## 2018-07-06 NOTE — Evaluation (Signed)
Physical Therapy Evaluation Patient Details Name: Jennifer Mcgee MRN: [id number] DOB: Oct 23, 1941 Today's Date: 07/06/2018   History of Present Illness  Patient is 77 yo female s/p L TKA 07/05/18, WBAT. PMH includes HTN, DM II, anemia, macular degeneration,  L4-L5 fusion  Clinical Impression  Patient A&Ox4 at start of session, agreeable to PT states her L knee pain is 2/10. Patient reports prior to admission being independent with intermittent use of SPC for ambulation with significant other in two level house. Family & significant other will be available 24/7 for assistance if needed if DC home. Patient demonstrated exercises with minimal verbal cues from PT, minimal increase in pain with SLR on L. Patient demonstrated bed mobility with supervision, sit <>stand transfers with CGA, and ambulated ~125ft with RW and CGA. Progressed to step through gait pattern with good weight bearing on LLE. Patient needed verbal/visual cues for use of AD/hand placement. The patient would benefit from further skilled PT to address changes from PLOF and for further education.     Follow Up Recommendations Home health PT    Equipment Recommendations  None recommended by PT    Recommendations for Other Services       Precautions / Restrictions Precautions Precautions: Knee Precaution Booklet Issued: Yes (comment) Restrictions Weight Bearing Restrictions: Yes LLE Weight Bearing: Weight bearing as tolerated      Mobility  Bed Mobility Overal bed mobility: Needs Assistance Bed Mobility: Supine to Sit     Supine to sit: Supervision        Transfers Overall transfer level: Needs assistance Equipment used: Rolling walker (2 wheeled) Transfers: Sit to/from Stand Sit to Stand: Min guard            Ambulation/Gait   Gait Distance (Feet): 180 Feet Assistive device: Rolling walker (2 wheeled)       General Gait Details: Patient able to ambulate with step to gait pattern, progressed to step  through gait pattern organically. Needed 1-2 standing rest breaks for fatigue/pain.  Stairs            Wheelchair Mobility    Modified Rankin (Stroke Patients Only)       Balance Overall balance assessment: Needs assistance Sitting-balance support: Feet supported Sitting balance-Leahy Scale: Good       Standing balance-Leahy Scale: Fair                               Pertinent Vitals/Pain Pain Assessment: 0-10 Pain Score: 2  Pain Location: L knee  Pain Descriptors / Indicators: Aching Pain Intervention(s): Monitored during session;Premedicated before session;Limited activity within patient's tolerance;Repositioned;Ice applied    Home Living Family/patient expects to be discharged to:: Private residence Living Arrangements: Spouse/significant other Available Help at Discharge: Family;Available 24 hours/day Type of Home: House Home Access: Stairs to enter Entrance Stairs-Rails: Can reach both Entrance Stairs-Number of Steps: 5 Home Layout: Two level;Able to live on main level with bedroom/bathroom Home Equipment: Gilford Rile - 2 wheels;Walker - 4 wheels;Cane - single point      Prior Function Level of Independence: Independent         Comments: using cane for ambulation, holds on to husband     Hand Dominance   Dominant Hand: Right    Extremity/Trunk Assessment   Upper Extremity Assessment Upper Extremity Assessment: Overall WFL for tasks assessed    Lower Extremity Assessment Lower Extremity Assessment: LLE deficits/detail;Generalized weakness LLE Deficits / Details: 4/5  Communication   Communication: No difficulties  Cognition Arousal/Alertness: Awake/alert Behavior During Therapy: WFL for tasks assessed/performed Overall Cognitive Status: Within Functional Limits for tasks assessed                                        General Comments      Exercises Total Joint Exercises Ankle Circles/Pumps: AROM;20  reps Quad Sets: AROM;20 reps Heel Slides: AROM;Left;20 reps Hip ABduction/ADduction: AROM;Left;20 reps Straight Leg Raises: AROM;Left;20 reps   Assessment/Plan    PT Assessment Patient needs continued PT services  PT Problem List Decreased strength;Decreased knowledge of use of DME;Decreased range of motion;Decreased activity tolerance;Decreased balance;Pain;Decreased knowledge of precautions;Decreased mobility       PT Treatment Interventions DME instruction;Balance training;Gait training;Neuromuscular re-education;Patient/family education;Therapeutic activities;Therapeutic exercise    PT Goals (Current goals can be found in the Care Plan section)  Acute Rehab PT Goals Patient Stated Goal: Patient is eager to have less L knee pain PT Goal Formulation: With patient Time For Goal Achievement: 07/20/18 Potential to Achieve Goals: Good    Frequency BID   Barriers to discharge        Co-evaluation               AM-PAC PT "6 Clicks" Daily Activity  Outcome Measure Difficulty turning over in bed (including adjusting bedclothes, sheets and blankets)?: A Little Difficulty moving from lying on back to sitting on the side of the bed? : A Little Difficulty sitting down on and standing up from a chair with arms (e.g., wheelchair, bedside commode, etc,.)?: A Little Help needed moving to and from a bed to chair (including a wheelchair)?: A Little Help needed walking in hospital room?: A Little Help needed climbing 3-5 steps with a railing? : A Little 6 Click Score: 18    End of Session Equipment Utilized During Treatment: Gait belt Activity Tolerance: Patient tolerated treatment well Patient left: with chair alarm set;in chair;with family/visitor present;with call bell/phone within reach;with SCD's reapplied(heels elevated) Nurse Communication: Mobility status PT Visit Diagnosis: Unsteadiness on feet (R26.81);Other abnormalities of gait and mobility (R26.89);Pain Pain -  Right/Left: Left Pain - part of body: Knee    Time: 7915-0569 PT Time Calculation (min) (ACUTE ONLY): 36 min   Charges:   PT Evaluation $PT Eval Low Complexity: 1 Low PT Treatments $Therapeutic Activity: 8-22 mins       Lieutenant Diego PT, DPT 9:28 AM,07/06/18 [phone]

## 2018-07-06 NOTE — Progress Notes (Signed)
Clinical Social Worker (CSW) received SNF consult. PT is recommending home health. RN case manager aware of above. Please reconsult if future social work needs arise. CSW signing off.   Barrie Wale, LCSW [phone] 

## 2018-07-07 LAB — GLUCOSE, CAPILLARY
GLUCOSE-CAPILLARY: 154 mg/dL — AB (ref 70–99)
GLUCOSE-CAPILLARY: 184 mg/dL — AB (ref 70–99)

## 2018-07-07 NOTE — Progress Notes (Signed)
Physical Therapy Treatment Patient Details Name: Jennifer Mcgee MRN: [id number] DOB: 1941/01/31 Today's Date: 07/07/2018    History of Present Illness Pt. is a 77 y.o female who ws admitted to [facility] for a Left TKR. Pt. PMHx includes: HTN, DM II, anemia, macular degeneration, L4-L5 fusion.    PT Comments    Patient agreeable to PT at start of session, states she feels "lightheaded, whoozy" which she thinks is due to taking pain medication on an empty stomach. Vitals WFLs. Patient able to perform therapeutic exercises during session with minimal to no verbal cues. Bed mobility performed mod I, sit<>stand transfers with CGA due to complaints of lightheadedness. Patient ambulated ~27ft with RW and CGA, performed stair navigation of 4 steps with b/l rails with supervision/CGA. PT answered family questions about car transfers and at home bed transfers. Patient left with all needs in reach, family and case management at bedside.      Follow Up Recommendations  Home health PT     Equipment Recommendations  None recommended by PT(Patient has RW that was her mothers.)    Recommendations for Other Services       Precautions / Restrictions Precautions Precautions: Knee;Fall Restrictions Weight Bearing Restrictions: Yes LLE Weight Bearing: Weight bearing as tolerated    Mobility  Bed Mobility Overal bed mobility: Modified Independent                Transfers Overall transfer level: Needs assistance Equipment used: Rolling walker (2 wheeled) Transfers: Sit to/from Stand Sit to Stand: Min guard         General transfer comment: Due to patient complaints of "lightheadness" and "whooziness"  Ambulation/Gait Ambulation/Gait assistance: Min guard;Supervision Gait Distance (Feet): 200 Feet Assistive device: Rolling walker (2 wheeled) Gait Pattern/deviations: Step-through pattern     General Gait Details: good gait speed   Stairs Stairs: Yes           Wheelchair  Mobility    Modified Rankin (Stroke Patients Only)       Balance                                            Cognition                                              Exercises Total Joint Exercises Ankle Circles/Pumps: AROM;Both;20 reps Quad Sets: AROM;Both;20 reps Heel Slides: AROM;Left;20 reps Straight Leg Raises: AROM;Left;20 reps Knee Flexion: AROM;5 reps Goniometric ROM: L knee extension:0deg, L knee flexion: 90deg    General Comments        Pertinent Vitals/Pain Pain Assessment: 0-10 Pain Score: 1  Pain Location: Left Knee Pain Descriptors / Indicators: Sore;Tender Pain Intervention(s): Monitored during session;Premedicated before session;Repositioned;Ice applied    Home Living                      Prior Function            PT Goals (current goals can now be found in the care plan section) Progress towards PT goals: Progressing toward goals    Frequency    BID      PT Plan Current plan remains appropriate    Co-evaluation  AM-PAC PT "6 Clicks" Daily Activity  Outcome Measure  Difficulty turning over in bed (including adjusting bedclothes, sheets and blankets)?: A Little Difficulty moving from lying on back to sitting on the side of the bed? : A Little Difficulty sitting down on and standing up from a chair with arms (e.g., wheelchair, bedside commode, etc,.)?: A Little Help needed moving to and from a bed to chair (including a wheelchair)?: A Little Help needed walking in hospital room?: A Little Help needed climbing 3-5 steps with a railing? : A Little 6 Click Score: 18    End of Session Equipment Utilized During Treatment: Gait belt Activity Tolerance: Patient tolerated treatment well Patient left: with chair alarm set;in chair;Other (comment)(heels elevated polar care applied) Nurse Communication: Mobility status PT Visit Diagnosis: Unsteadiness on feet (R26.81);Other  abnormalities of gait and mobility (R26.89);Pain Pain - Right/Left: Left Pain - part of body: Knee     Time: 9977-4142 PT Time Calculation (min) (ACUTE ONLY): 23 min  Charges:  $Therapeutic Activity: 23-37 mins                     Lieutenant Diego PT, DPT 10:52 AM,07/07/18 [phone]

## 2018-07-07 NOTE — Progress Notes (Signed)
   Subjective: 2 Days Post-Op Procedure(s) (LRB): COMPUTER ASSISTED TOTAL KNEE ARTHROPLASTY (Left) Patient reports pain as 6 on 0-10 scale.   Patient is well, and has had no acute complaints or problems We did well with physical therapy yesterday. Plan is to go Home after hospital stay. no nausea and no vomiting Patient denies any chest pains or shortness of breath. Patient complained of more pain to the posterior hamstring  region secondary to the bone from more than the surgery. Objective: Vital signs in last 24 hours: Temp:  [97.4 F (36.3 C)-98.3 F (36.8 C)] 98.3 F (36.8 C) (08/01 2347) Pulse Rate:  [66-91] 91 (08/01 2347) Resp:  [16-18] 16 (08/01 2347) BP: (125-141)/(43-81) 141/60 (08/01 2347) SpO2:  [95 %-99 %] 95 % (08/01 2347) well approximated incision Heels are non tender and elevated off the bed using rolled towels Intake/Output from previous day: 08/01 0701 - 08/02 0700 In: 1573.3 [P.O.:680; I.V.:815; IV Piggyback:53.3] Out: 190 [Drains:190] Intake/Output this shift: No intake/output data recorded.  No results for input(s): HGB in the last 72 hours. No results for input(s): WBC, RBC, HCT, PLT in the last 72 hours. No results for input(s): NA, K, CL, CO2, BUN, CREATININE, GLUCOSE, CALCIUM in the last 72 hours. No results for input(s): LABPT, INR in the last 72 hours.  EXAM General - Patient is Alert, Appropriate and Oriented Extremity - Neurologically intact Neurovascular intact Sensation intact distally Intact pulses distally Dorsiflexion/Plantar flexion intact No cellulitis present Compartment soft Dressing - scant drainage Motor Function - intact, moving foot and toes well on exam.    Past Medical History:  Diagnosis Date  . Anemia   . Arthritis    degenerative arthritis left knee  . Chicken pox   . Colon polyp   . Diabetes mellitus without complication ([place])   . Hyperlipidemia   . Hypertension   . Macular degeneration      Assessment/Plan: 2 Days Post-Op Procedure(s) (LRB): COMPUTER ASSISTED TOTAL KNEE ARTHROPLASTY (Left) Active Problems:   S/P total knee arthroplasty  Estimated body mass index is 36.15 kg/m as calculated from the following:   Height as of this encounter: 5\' 3"  (1.6 m).   Weight as of this encounter: 92.6 kg (204 lb 1.6 oz). Up with therapy Discharge home with home health  Labs: None DVT Prophylaxis - Lovenox, Foot Pumps and TED hose Weight-Bearing as tolerated to left leg Hemovac was discontinued today.  Into the drain appeared to be intact. Patient may be discharged home once she has a bowel movement, does the lap around the nurses desk and stairs. Please wash operative leg, change dressing and apply  TED stockings to both legs.  Please give the patient 2 extra honeycomb dressings to take home  Aubrielle Stroud R. [facility] PA [place] 07/07/2018, 8:17 AM

## 2018-07-07 NOTE — Care Management Important Message (Signed)
Important Message  Patient Details  Name: Jennifer Mcgee MRN: [id number] Date of Birth: 23-Nov-1941   Medicare Important Message Given:  Yes    [name] 07/07/2018, 10:40 AM

## 2018-07-07 NOTE — Care Management Note (Signed)
Case Management Note  Patient Details  Name: Jennifer Mcgee MRN: [id number] Date of Birth: Dec 24, 1940  Subjective/Objective:  Spoke with spouse regarding discharge planning. Offered a list of home health services. Referral to [facility] for HHPT. Patient will need a walker and bsc. Ordered from [place] with [facility]. Pharmacy: [facility] [address]. Cost of Lovenox is $ 64. Spouse updated and denies issues paying for medication.                    Action/Plan: [facility] for HHPT, DME with [facility]. Lovenox called in and ready.   Expected Discharge Date:  07/07/18               Expected Discharge Plan:  [place]  In-House Referral:     Discharge planning Services  CM Consult  Post Acute Care Choice:  Durable Medical Equipment, Home Health Choice offered to:  Spouse  DME Arranged:  Bedside commode, Walker rolling DME Agency:  [place]:  PT [place] Agency:  [facility]  Status of Service:  Completed, signed off  If discussed at [employer], dates discussed:    Additional Comments:  Jolly Mango, RN 07/07/2018, 1:25 PM

## 2018-07-07 NOTE — Anesthesia Postprocedure Evaluation (Signed)
Anesthesia Post Note  Patient: Jennifer Mcgee  Procedure(s) Performed: COMPUTER ASSISTED TOTAL KNEE ARTHROPLASTY (Left Knee)  Anesthesia Type: Spinal     Last Vitals:  Vitals:   07/07/18 0822 07/07/18 1018  BP: (!) 149/62 126/62  Pulse: 98 92  Resp:    Temp: 36.7 C   SpO2: 95% 96%    Last Pain:  Vitals:   07/07/18 1310  TempSrc:   PainSc: [place] [name]

## 2018-07-07 NOTE — Progress Notes (Signed)
PT Cancellation Note  Patient Details Name: Jennifer Mcgee MRN: [id number] DOB: 01-Mar-1941   Cancelled Treatment:    Reason Eval/Treat Not Completed: Other (comment)(Patient complains of nausea, high levels of pain, and hunger because of a late breakfast. Has requested that PT come back in a bit, Pt will re attempt this AM.)  Lieutenant Diego PT, DPT 8:18 AM,07/07/18 [phone]

## 2018-09-04 ENCOUNTER — Other Ambulatory Visit: Payer: Self-pay | Admitting: Family Medicine

## 2018-09-04 DIAGNOSIS — Z1231 Encounter for screening mammogram for malignant neoplasm of breast: Secondary | ICD-10-CM

## 2018-09-13 ENCOUNTER — Ambulatory Visit
Admission: RE | Admit: 2018-09-13 | Discharge: 2018-09-13 | Disposition: A | Discharge status: Home / Self Care | Payer: Medicare Other | Source: Ambulatory Visit | Attending: Family Medicine | Admitting: Family Medicine

## 2018-09-13 ENCOUNTER — Other Ambulatory Visit: Payer: Self-pay | Admitting: Family Medicine

## 2018-09-13 DIAGNOSIS — E7800 Pure hypercholesterolemia, unspecified: Secondary | ICD-10-CM

## 2018-09-13 DIAGNOSIS — Z1231 Encounter for screening mammogram for malignant neoplasm of breast: Secondary | ICD-10-CM | POA: Insufficient documentation

## 2018-09-26 ENCOUNTER — Ambulatory Visit: Payer: Self-pay | Admitting: Family Medicine

## 2018-09-27 ENCOUNTER — Ambulatory Visit: Payer: Self-pay

## 2018-09-27 ENCOUNTER — Encounter: Payer: Self-pay | Admitting: Family Medicine

## 2018-10-03 ENCOUNTER — Telehealth: Payer: Self-pay

## 2018-10-03 NOTE — Telephone Encounter (Signed)
LMTCB and schedule AWV.  -MM 

## 2018-10-20 NOTE — Telephone Encounter (Signed)
Spoke with husband (ok per DPR) and he stated pt is going to be tied up with him for a little while. He is going through cancer treatments. Asked if I could check back in January and he said yes. Note made to f/u then. -MM

## 2018-11-03 ENCOUNTER — Other Ambulatory Visit: Payer: Self-pay | Admitting: Family Medicine

## 2018-11-03 DIAGNOSIS — E119 Type 2 diabetes mellitus without complications: Secondary | ICD-10-CM

## 2018-11-10 ENCOUNTER — Other Ambulatory Visit: Payer: Self-pay | Admitting: Family Medicine

## 2018-11-10 DIAGNOSIS — I10 Essential (primary) hypertension: Secondary | ICD-10-CM

## 2018-12-11 ENCOUNTER — Other Ambulatory Visit: Payer: Self-pay | Admitting: Family Medicine

## 2018-12-11 DIAGNOSIS — E119 Type 2 diabetes mellitus without complications: Secondary | ICD-10-CM

## 2018-12-11 NOTE — Telephone Encounter (Signed)
Pt declined the AWV and states she saw no point in it. Scheduled CPE for 01/29/19 @ 2:00 PM. FYI to PCP!

## 2019-01-08 LAB — HM DIABETES EYE EXAM

## 2019-01-29 ENCOUNTER — Encounter: Payer: Self-pay | Admitting: Family Medicine

## 2019-01-29 ENCOUNTER — Ambulatory Visit (INDEPENDENT_AMBULATORY_CARE_PROVIDER_SITE_OTHER): Payer: Medicare Other | Admitting: Family Medicine

## 2019-01-29 VITALS — BP 144/78 | HR 80 | Temp 98.9°F | Resp 16 | Ht 63.0 in | Wt 207.0 lb

## 2019-01-29 DIAGNOSIS — I10 Essential (primary) hypertension: Secondary | ICD-10-CM

## 2019-01-29 DIAGNOSIS — E119 Type 2 diabetes mellitus without complications: Secondary | ICD-10-CM

## 2019-01-29 DIAGNOSIS — M179 Osteoarthritis of knee, unspecified: Secondary | ICD-10-CM

## 2019-01-29 DIAGNOSIS — Z0000 Encounter for general adult medical examination without abnormal findings: Secondary | ICD-10-CM | POA: Diagnosis not present

## 2019-01-29 DIAGNOSIS — E7800 Pure hypercholesterolemia, unspecified: Secondary | ICD-10-CM

## 2019-01-29 DIAGNOSIS — M1710 Unilateral primary osteoarthritis, unspecified knee: Secondary | ICD-10-CM

## 2019-01-29 LAB — POCT URINALYSIS DIPSTICK
Bilirubin, UA: NEGATIVE
GLUCOSE UA: NEGATIVE
Ketones, UA: NEGATIVE
Leukocytes, UA: NEGATIVE
NITRITE UA: NEGATIVE
PH UA: 7.5 (ref 5.0–8.0)
Protein, UA: NEGATIVE
RBC UA: NEGATIVE
Spec Grav, UA: 1.02 (ref 1.010–1.025)
UROBILINOGEN UA: 0.2 U/dL

## 2019-01-29 LAB — POCT UA - MICROALBUMIN: Microalbumin Ur, POC: 20 mg/L

## 2019-01-29 NOTE — Progress Notes (Signed)
Patient: Jennifer Mcgee, Female    DOB: May 05, 1941, 78 y.o.   MRN: [id number] Visit Date: 01/29/2019  Today's Provider: Wilhemena Durie, MD   Chief Complaint  Patient presents with  . Annual Wellness Exam   Subjective:     Annual physical visit Jennifer Mcgee is a 78 y.o. female. She feels fairly well. She reports exercising not regularly, due to recent knee replacement. She reports she is sleeping well.  Patient has had a very stressful year as her husband now has metastatic prostate cancer and is just finished radiation therapy. Patient does snore but denies apnea.  Epworth today is 6.  She does state that recently she is started clenching her teeth at night and has a bite guard that she uses which is helped her snoring according to her husband. She is married for many years.,  She is a mother of 5 the grandmother of 47 and has 1 great grandson. Colonoscopy- 04/28/2016. Repeat in 5 yrs.  Mammogram- 09/13/2018. Normal.   Review of Systems  Constitutional: Negative.   HENT: Negative.   Eyes: Negative.   Respiratory: Negative.   Cardiovascular: Negative.   Gastrointestinal: Negative.   Endocrine: Negative.   Genitourinary: Negative.   Musculoskeletal: Negative.   Skin: Negative.   Allergic/Immunologic: Negative.   Neurological: Negative.   Hematological: Negative.   Psychiatric/Behavioral: Negative.     Social History   Socioeconomic History  . Marital status: Married    Spouse name: Not on file  . Number of children: Not on file  . Years of education: Not on file  . Highest education level: Not on file  Occupational History  . Not on file  Social Needs  . Financial resource strain: Not on file  . Food insecurity:    Worry: Not on file    Inability: Not on file  . Transportation needs:    Medical: Not on file    Non-medical: Not on file  Tobacco Use  . Smoking status: Never Smoker  . Smokeless tobacco: Never Used  Substance and Sexual Activity  .  Alcohol use: No  . Drug use: No  . Sexual activity: Not on file  Lifestyle  . Physical activity:    Days per week: Not on file    Minutes per session: Not on file  . Stress: Not on file  Relationships  . Social connections:    Talks on phone: Not on file    Gets together: Not on file    Attends religious service: Not on file    Active member of club or organization: Not on file    Attends meetings of clubs or organizations: Not on file    Relationship status: Not on file  . Intimate partner violence:    Fear of current or ex partner: Not on file    Emotionally abused: Not on file    Physically abused: Not on file    Forced sexual activity: Not on file  Other Topics Concern  . Not on file  Social History Narrative  . Not on file    Past Medical History:  Diagnosis Date  . Anemia   . Arthritis    degenerative arthritis left knee  . Chicken pox   . Colon polyp   . Diabetes mellitus without complication ([place])   . Hyperlipidemia   . Hypertension   . Macular degeneration      Patient Active Problem List   Diagnosis Date Noted  .  S/P total knee arthroplasty 07/05/2018  . Bronchitis 09/30/2015  . Adaptation reaction 04/03/2015  . At risk of disease 04/03/2015  . 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 Back pain, chronic 04/03/2015  . Colon polyp 04/03/2015  . Diabetes ([place]) 04/03/2015  . 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 Gout 04/03/2015  . Blood in the urine 04/03/2015  . Calcium blood increased 04/03/2015  . Hypercholesteremia 04/03/2015  . High sodium levels 04/03/2015  . BP (high blood pressure) 04/03/2015  . 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 Arthritis, degenerative 04/03/2015  . RAD (reactive airway disease) 04/03/2015  . Diverticulitis of colon without hemorrhage 04/02/2013  . Personal history of colonic polyps 03/09/2013    Past Surgical History:  Procedure Laterality Date  . ABDOMINAL HYSTERECTOMY  1981  . APPENDECTOMY  07/2004   Dr. Jamal Collin  . BACK  SURGERY     L4-5; fusion  . BREAST SURGERY     biopsy- benign  . BREAST SURGERY  1977   duct operation  . CATARACT EXTRACTION W/ INTRAOCULAR LENS  IMPLANT, BILATERAL    . COLONOSCOPY  03-28-13   Dr Jamal Collin  . COLONOSCOPY WITH PROPOFOL N/A 04/28/2016   Procedure: COLONOSCOPY WITH PROPOFOL;  Surgeon: Christene Lye, MD;  Location: [facility];  Service: Endoscopy;  Laterality: N/A;  . EXCISION / BIOPSY BREAST / NIPPLE / DUCT Left 01/20/1973   milk duct removed  . EYE SURGERY Bilateral 2013   cataract  . KNEE ARTHROPLASTY Left 07/05/2018   Procedure: COMPUTER ASSISTED TOTAL KNEE ARTHROPLASTY;  Surgeon: Dereck Leep, MD;  Location: [facility] ORS;  Service: Orthopedics;  Laterality: Left;    Her family history includes Breast cancer (age of onset: 5) in her mother; COPD in her mother; Cancer (age of onset: 26) in her mother; Dementia in her father; Diabetes in her paternal grandmother; Heart attack in her paternal grandfather; Heart disease in her mother; Hyperlipidemia in her mother; Hypertension in her father and mother.   Current Outpatient Medications:  .  acetaminophen (TYLENOL) 325 MG tablet, Take 650 mg by mouth every 6 (six) hours as needed., Disp: , Rfl:  .  atorvastatin (LIPITOR) 20 MG tablet, TAKE 1 TABLET (20 MG TOTAL) BY MOUTH AT BEDTIME., Disp: 90 tablet, Rfl: 3 .  Cholecalciferol (VITAMIN D3) 2000 UNITS TABS, Take 2,000 Units by mouth daily. , Disp: , Rfl:  .  docusate sodium (COLACE) 100 MG capsule, Take 100 mg by mouth daily., Disp: , Rfl:  .  ferrous sulfate 325 (65 FE) MG tablet, Take 325 mg by mouth daily with breakfast. , Disp: , Rfl:  .  glipiZIDE (GLUCOTROL XL) 2.5 MG 24 hr tablet, TAKE ONE TABLET BY MOUTH ONCE DAILY, Disp: 90 tablet, Rfl: 3 .  glucose blood test strip, Use with meter to check glucose daily, Disp: 100 each, Rfl: 6 .  hydrochlorothiazide (MICROZIDE) 12.5 MG capsule, TAKE 1 CAPSULE (12.5 MG TOTAL) BY MOUTH DAILY., Disp: 90 capsule, Rfl: 3 .   magnesium gluconate (MAGONATE) 500 MG tablet, Take 500 mg by mouth daily. , Disp: , Rfl:  .  metFORMIN (GLUCOPHAGE) 1000 MG tablet, TAKE 1 TABLET (1,000 MG TOTAL) BY MOUTH 2 (TWO) TIMES DAILY WITH A MEAL., Disp: 60 tablet, Rfl: 3 .  Multiple Vitamin (MULTIVITAMIN) tablet, Take 1 tablet by mouth daily., Disp: , Rfl:  .  Multiple Vitamins-Minerals (PRESERVISION AREDS 2 PO), Take 1 capsule by mouth 2 (two) times daily., Disp: , Rfl:  .  quinapril (ACCUPRIL) 40 MG tablet, Take 1 tablet (40  mg total) by mouth daily., Disp: 90 tablet, Rfl: 3 .  ACCU-CHEK FASTCLIX LANCETS MISC, Use to check fasting blood sugars once daily., Disp: 102 each, Rfl: 3 .  enoxaparin (LOVENOX) 40 MG/0.4ML injection, Inject 0.4 mLs (40 mg total) into the skin daily for 14 days., Disp: 14 Syringe, Rfl: 0 .  oxyCODONE (OXY IR/ROXICODONE) 5 MG immediate release tablet, Take 1 tablet (5 mg total) by mouth every 4 (four) hours as needed for moderate pain (pain score 4-6)., Disp: 30 tablet, Rfl: 0 .  traMADol (ULTRAM) 50 MG tablet, Take 1-2 tablets (50-100 mg total) by mouth every 6 (six) hours as needed for moderate pain., Disp: 60 tablet, Rfl: 0 .  vitamin B-12 (CYANOCOBALAMIN) 1000 MCG tablet, Take 1,000 mcg by mouth daily., Disp: , Rfl:   Patient Care Team: Jerrol Banana., MD as PCP - General (Family Medicine) Christene Lye, MD (General Surgery) Earnestine Leys, MD as Consulting Physician (Specialist) Ralene Bathe, MD as Consulting Physician (Dermatology)    Objective:    Vitals: BP (!) 144/78 (BP Location: Left Arm, Patient Position: Sitting, Cuff Size: Large)   Pulse 80   Temp 98.9 F (37.2 C)   Resp 16   Ht 5\' 3"  (1.6 m)   Wt 207 lb (93.9 kg)   SpO2 98%   BMI 36.67 kg/m   Physical Exam Vitals signs reviewed.  Constitutional:      Appearance: She is well-developed. She is obese.  HENT:     Head: Normocephalic and atraumatic.     Right Ear: Tympanic membrane and external ear normal.     Left  Ear: Tympanic membrane and external ear normal.     Nose: Nose normal.     Mouth/Throat:     Mouth: Mucous membranes are moist.     Pharynx: Oropharynx is clear.  Eyes:     General: No scleral icterus.    Extraocular Movements: Extraocular movements intact.     Conjunctiva/sclera: Conjunctivae normal.     Pupils: Pupils are equal, round, and reactive to light.  Neck:     Musculoskeletal: Normal range of motion and neck supple.     Thyroid: No thyromegaly.  Cardiovascular:     Rate and Rhythm: Normal rate and regular rhythm.     Heart sounds: Normal heart sounds.  Pulmonary:     Effort: Pulmonary effort is normal.     Breath sounds: Normal breath sounds.  Abdominal:     Palpations: Abdomen is soft.  Musculoskeletal: Normal range of motion.     Comments: Trace lower extremity edema.  Skin:    General: Skin is warm and dry.  Neurological:     General: No focal deficit present.     Mental Status: She is alert and oriented to person, place, and time. Mental status is at baseline.     Deep Tendon Reflexes: Reflexes are normal and symmetric.  Psychiatric:        Mood and Affect: Mood normal.        Behavior: Behavior normal.        Thought Content: Thought content normal.        Judgment: Judgment normal.     Activities of Daily Living In your present state of health, do you have any difficulty performing the following activities: 01/29/2019 07/05/2018  Hearing? N -  Vision? N -  Difficulty concentrating or making decisions? N -  Walking or climbing stairs? Y -  Comment - -  Dressing or bathing?  N -  Doing errands, shopping? N N  Some recent data might be hidden    Fall Risk Assessment Fall Risk  09/26/2017 08/31/2017 05/18/2016 11/05/2015 06/04/2015  Falls in the past year? No No No No No     Depression Screen PHQ 2/9 Scores 01/29/2019 09/26/2017 08/31/2017 05/18/2016  PHQ - 2 Score 0 0 0 0    No flowsheet data found.    Assessment & Plan:     Annual physical  Visit  Reviewed patient's Family Medical History Reviewed and updated list of patient's medical providers Assessment of cognitive impairment was done Assessed patient's functional ability Established a written schedule for health screening [place] Completed and Reviewed  Exercise Activities and Dietary recommendations Goals    . Reduce calorie intake to 2000 calories per day     Recommend monitoring calorie intake and avoiding going over 2000 calories a day.        Immunization History  Administered Date(s) Administered  . Influenza, High Dose Seasonal PF 09/17/2017  . Influenza,inj,quad, With Preservative 12/06/2016  . Influenza-Unspecified 10/21/2015, 01/01/2016  . Pneumococcal Conjugate-13 03/22/2014  . Pneumococcal-Unspecified 09/03/2013  . Tdap 10/25/2014  . Zoster 11/01/2011    Health Maintenance  Topic Date Due  . FOOT EXAM  10/04/2017  . INFLUENZA VACCINE  07/06/2018  . HEMOGLOBIN A1C  12/22/2018  . OPHTHALMOLOGY EXAM  01/09/2020  . TETANUS/TDAP  10/25/2024  . DEXA SCAN  Completed  . PNA vac Low Risk Adult  Completed     Discussed health benefits of physical activity, and encouraged her to engage in regular exercise appropriate for her age and condition.  1. Encounter for annual physical examination excluding gynecological examination in a patient older than 17 years Patient declines breast exam.  No longer needs Pap smear.  Have recommended she follow-up for colonoscopy in 2 years because of history of polyps.  2. Essential hypertension Husband has a home blood pressure cuff.  She will check it at home and bring in the readings on her next visit in 4 months. - CBC with Differential/Platelet - Comprehensive metabolic panel - POCT urinalysis dipstick  3. Hypercholesteremia  - Lipid panel - TSH  4. Osteoarthritis of knee, unspecified laterality, unspecified osteoarthritis type Status post total knee replacement, doing well.  5.  Type 2 diabetes mellitus without complication, without long-term current use of insulin (HCC) Repeat A1c in 4 months. - Hemoglobin A1c - POCT UA - Microalbumin  I have done the exam and reviewed the above chart and it is accurate to the best of my knowledge. Development worker, community has been used in this note in any air is in the dictation or transcription are unintentional.  Wilhemena Durie, MD  [place]

## 2019-01-31 LAB — COMPREHENSIVE METABOLIC PANEL
ALT: 22 IU/L (ref 0–32)
AST: 18 IU/L (ref 0–40)
Albumin/Globulin Ratio: 2 (ref 1.2–2.2)
Albumin: 4.3 g/dL (ref 3.7–4.7)
Alkaline Phosphatase: 80 IU/L (ref 39–117)
BUN/Creatinine Ratio: 28 (ref 12–28)
BUN: 23 mg/dL (ref 8–27)
Bilirubin Total: 0.7 mg/dL (ref 0.0–1.2)
CALCIUM: 9.6 mg/dL (ref 8.7–10.3)
CO2: 23 mmol/L (ref 20–29)
Chloride: 102 mmol/L (ref 96–106)
Creatinine, Ser: 0.83 mg/dL (ref 0.57–1.00)
GFR, EST AFRICAN AMERICAN: 79 mL/min/{1.73_m2} (ref >59)
GFR, EST NON AFRICAN AMERICAN: 68 mL/min/{1.73_m2} (ref >59)
GLUCOSE: 105 mg/dL — AB (ref 65–99)
Globulin, Total: 2.2 g/dL (ref 1.5–4.5)
Potassium: 4.3 mmol/L (ref 3.5–5.2)
Sodium: 141 mmol/L (ref 134–144)
TOTAL PROTEIN: 6.5 g/dL (ref 6.0–8.5)

## 2019-01-31 LAB — TSH: TSH: 2.42 u[IU]/mL (ref 0.450–4.500)

## 2019-01-31 LAB — LIPID PANEL
CHOL/HDL RATIO: 2.7 ratio (ref 0.0–4.4)
Cholesterol, Total: 161 mg/dL (ref 100–199)
HDL: 60 mg/dL (ref >39)
LDL Calculated: 80 mg/dL (ref 0–99)
Triglycerides: 105 mg/dL (ref 0–149)
VLDL CHOLESTEROL CAL: 21 mg/dL (ref 5–40)

## 2019-01-31 LAB — CBC WITH DIFFERENTIAL/PLATELET
BASOS ABS: 0.1 10*3/uL (ref 0.0–0.2)
BASOS: 1 %
EOS (ABSOLUTE): 0.2 10*3/uL (ref 0.0–0.4)
Eos: 4 %
Hematocrit: 33.8 % — ABNORMAL LOW (ref 34.0–46.6)
Hemoglobin: 11.2 g/dL (ref 11.1–15.9)
IMMATURE GRANS (ABS): 0 10*3/uL (ref 0.0–0.1)
IMMATURE GRANULOCYTES: 0 %
Lymphocytes Absolute: 2.3 10*3/uL (ref 0.7–3.1)
Lymphs: 34 %
MCH: 28.7 pg (ref 26.6–33.0)
MCHC: 33.1 g/dL (ref 31.5–35.7)
MCV: 87 fL (ref 79–97)
Monocytes Absolute: 0.8 10*3/uL (ref 0.1–0.9)
Monocytes: 12 %
NEUTROS PCT: 49 %
Neutrophils Absolute: 3.4 10*3/uL (ref 1.4–7.0)
PLATELETS: 381 10*3/uL (ref 150–450)
RBC: 3.9 x10E6/uL (ref 3.77–5.28)
RDW: 13.8 % (ref 11.7–15.4)
WBC: 6.9 10*3/uL (ref 3.4–10.8)

## 2019-01-31 LAB — HEMOGLOBIN A1C
Est. average glucose Bld gHb Est-mCnc: 140 mg/dL
Hgb A1c MFr Bld: 6.5 % — ABNORMAL HIGH (ref 4.8–5.6)

## 2019-02-05 ENCOUNTER — Telehealth: Payer: Self-pay

## 2019-02-05 NOTE — Telephone Encounter (Signed)
Pt advised.   Thanks,   -Johnchristopher Sarvis  

## 2019-02-05 NOTE — Telephone Encounter (Signed)
-----   Message from Jerrol Banana., MD sent at 02/02/2019  1:41 PM EST ----- Labs stable.  No changes.

## 2019-02-15 ENCOUNTER — Other Ambulatory Visit: Payer: Self-pay | Admitting: Family Medicine

## 2019-02-15 DIAGNOSIS — E119 Type 2 diabetes mellitus without complications: Secondary | ICD-10-CM

## 2019-02-18 ENCOUNTER — Other Ambulatory Visit: Payer: Self-pay | Admitting: Family Medicine

## 2019-02-18 DIAGNOSIS — E119 Type 2 diabetes mellitus without complications: Secondary | ICD-10-CM

## 2019-02-19 NOTE — Telephone Encounter (Signed)
Pharmacy requesting refills. Thanks!  

## 2019-03-05 ENCOUNTER — Other Ambulatory Visit: Payer: Self-pay | Admitting: Family Medicine

## 2019-03-05 DIAGNOSIS — E119 Type 2 diabetes mellitus without complications: Secondary | ICD-10-CM

## 2019-05-04 ENCOUNTER — Other Ambulatory Visit: Payer: Self-pay | Admitting: Family Medicine

## 2019-05-04 DIAGNOSIS — E119 Type 2 diabetes mellitus without complications: Secondary | ICD-10-CM

## 2019-05-30 ENCOUNTER — Ambulatory Visit (INDEPENDENT_AMBULATORY_CARE_PROVIDER_SITE_OTHER): Payer: Medicare Other | Admitting: Family Medicine

## 2019-05-30 ENCOUNTER — Other Ambulatory Visit: Payer: Self-pay

## 2019-05-30 VITALS — BP 140/72 | HR 84 | Temp 98.3°F | Resp 16 | Ht 63.0 in | Wt 212.0 lb

## 2019-05-30 DIAGNOSIS — E7800 Pure hypercholesterolemia, unspecified: Secondary | ICD-10-CM

## 2019-05-30 DIAGNOSIS — E119 Type 2 diabetes mellitus without complications: Secondary | ICD-10-CM

## 2019-05-30 DIAGNOSIS — D649 Anemia, unspecified: Secondary | ICD-10-CM | POA: Diagnosis not present

## 2019-05-30 DIAGNOSIS — M25512 Pain in left shoulder: Secondary | ICD-10-CM

## 2019-05-30 DIAGNOSIS — I10 Essential (primary) hypertension: Secondary | ICD-10-CM

## 2019-05-30 MED ORDER — MELOXICAM 7.5 MG PO TABS: 7.5000 mg | ORAL_TABLET | Freq: Every day | ORAL | 0 refills | Status: DC

## 2019-05-30 NOTE — Progress Notes (Signed)
Patient: Jennifer Mcgee Female    DOB: 06-Nov-1941   78 y.o.   MRN: [id number] Visit Date: 05/30/2019  Today's Provider: Wilhemena Durie, MD   Chief Complaint  Patient presents with  . Diabetes  . Hyperlipidemia  . Hypertension  . leg and shoulder pain   Subjective:   HPI  Diabetes Mellitus Type II, Follow-up:   Lab Results  Component Value Date   HGBA1C 6.5 (H) 01/30/2019   HGBA1C 6.5 (H) 06/21/2018   HGBA1C 6.7 (A) 05/23/2018    Last seen for diabetes 4 months ago.  Management since then includes no changes. She reports good compliance with treatment. She is not having side effects.  Current symptoms include none and have been stable. Home blood sugar records: fasting range: 120s  Episodes of hypoglycemia? no   Current Insulin Regimen: none Most Recent Eye Exam: up to date Weight trend: stable Prior visit with dietician: No Current exercise: walking and yard work Current diet habits: well balanced  Pertinent Labs:    Component Value Date/Time   CHOL 161 01/30/2019 0945   TRIG 105 01/30/2019 0945   HDL 60 01/30/2019 0945   LDLCALC 80 01/30/2019 0945   LDLCALC 80 09/26/2017 1228   CREATININE 0.83 01/30/2019 0945   CREATININE 0.94 (H) 08/31/2017 1330    Wt Readings from Last 3 Encounters:  05/30/19 212 lb (96.2 kg)  01/29/19 207 lb (93.9 kg)  07/05/18 204 lb 1.6 oz (92.6 kg)   Patient also mentions that she has had worsening pain in her left shoulder. She feels that it could be bursitis. She denies any injuries.  She is also having pain in her lower left leg. She reports that it is very tender to touch. She has not fallen or had any injuries.   Allergies  Allergen Reactions  . Epinephrine Other (See Comments)    Feels jumpy Jerking movements all over the body.   . Penicillins Itching and Other (See Comments)    Has patient had a PCN reaction causing immediate rash, facial/tongue/throat swelling, SOB or lightheadedness with hypotension:  Yes Has patient had a PCN reaction causing severe rash involving mucus membranes or skin necrosis: No Has patient had a PCN reaction that required hospitalization: No Has patient had a PCN reaction occurring within the last 10 years: No If all of the above answers are "NO", then may proceed with Cephalosporin use.   [place] [Levofloxacin In D5w] Anxiety     Current Outpatient Medications:  .  ACCU-CHEK FASTCLIX LANCETS MISC, Use to check fasting blood sugars once daily., Disp: 102 each, Rfl: 3 .  atorvastatin (LIPITOR) 20 MG tablet, TAKE 1 TABLET (20 MG TOTAL) BY MOUTH AT BEDTIME., Disp: 90 tablet, Rfl: 3 .  Cholecalciferol (VITAMIN D3) 2000 UNITS TABS, Take 2,000 Units by mouth daily. , Disp: , Rfl:  .  docusate sodium (COLACE) 100 MG capsule, Take 100 mg by mouth daily., Disp: , Rfl:  .  ferrous sulfate 325 (65 FE) MG tablet, Take 325 mg by mouth daily with breakfast. , Disp: , Rfl:  .  glipiZIDE (GLUCOTROL XL) 2.5 MG 24 hr tablet, Take 1 tablet by mouth once daily, Disp: 90 tablet, Rfl: 3 .  glucose blood test strip, Use with meter to check glucose daily, Disp: 100 each, Rfl: 6 .  hydrochlorothiazide (MICROZIDE) 12.5 MG capsule, TAKE 1 CAPSULE (12.5 MG TOTAL) BY MOUTH DAILY., Disp: 90 capsule, Rfl: 3 .  magnesium gluconate (MAGONATE) 500  MG tablet, Take 500 mg by mouth daily. , Disp: , Rfl:  .  metFORMIN (GLUCOPHAGE) 1000 MG tablet, TAKE 1 TABLET (1,000 MG TOTAL) BY MOUTH 2 (TWO) TIMES DAILY WITH A MEAL., Disp: 60 tablet, Rfl: 3 .  Multiple Vitamin (MULTIVITAMIN) tablet, Take 1 tablet by mouth daily., Disp: , Rfl:  .  Multiple Vitamins-Minerals (PRESERVISION AREDS 2 PO), Take 1 capsule by mouth 2 (two) times daily., Disp: , Rfl:  .  quinapril (ACCUPRIL) 40 MG tablet, Take 1 tablet (40 mg total) by mouth daily., Disp: 90 tablet, Rfl: 3 .  vitamin B-12 (CYANOCOBALAMIN) 1000 MCG tablet, Take 1,000 mcg by mouth daily., Disp: , Rfl:  .  acetaminophen (TYLENOL) 325 MG tablet, Take 650 mg by  mouth every 6 (six) hours as needed., Disp: , Rfl:  .  enoxaparin (LOVENOX) 40 MG/0.4ML injection, Inject 0.4 mLs (40 mg total) into the skin daily for 14 days., Disp: 14 Syringe, Rfl: 0 .  oxyCODONE (OXY IR/ROXICODONE) 5 MG immediate release tablet, Take 1 tablet (5 mg total) by mouth every 4 (four) hours as needed for moderate pain (pain score 4-6)., Disp: 30 tablet, Rfl: 0 .  traMADol (ULTRAM) 50 MG tablet, Take 1-2 tablets (50-100 mg total) by mouth every 6 (six) hours as needed for moderate pain., Disp: 60 tablet, Rfl: 0  Review of Systems  Constitutional: Negative for activity change, appetite change, chills, fatigue and fever.  Eyes: Negative.   Respiratory: Negative for cough and shortness of breath.   Cardiovascular: Negative for chest pain, palpitations and leg swelling.  Gastrointestinal: Negative.   Endocrine: Negative for cold intolerance, heat intolerance, polydipsia, polyphagia and polyuria.  Musculoskeletal: Positive for arthralgias and myalgias.       Right shoulder pain with abduction.  Allergic/Immunologic: Negative for environmental allergies.  Neurological: Negative for dizziness, light-headedness and headaches.  Psychiatric/Behavioral: Negative for agitation, self-injury, sleep disturbance and suicidal ideas. The patient is not nervous/anxious.     Social History   Tobacco Use  . Smoking status: Never Smoker  . Smokeless tobacco: Never Used  Substance Use Topics  . Alcohol use: No      Objective:   BP 140/72   Pulse 84   Temp 98.3 F (36.8 C)   Resp 16   Ht 5\' 3"  (1.6 m)   Wt 212 lb (96.2 kg)   SpO2 97%   BMI 37.55 kg/m  Vitals:   05/30/19 0907  BP: 140/72  Pulse: 84  Resp: 16  Temp: 98.3 F (36.8 C)  SpO2: 97%  Weight: 212 lb (96.2 kg)  Height: 5\' 3"  (1.6 m)     Physical Exam Vitals signs reviewed.  Constitutional:      Appearance: She is well-developed.  HENT:     Head: Normocephalic and atraumatic.  Eyes:     Conjunctiva/sclera:  Conjunctivae normal.  Neck:     Musculoskeletal: Normal range of motion and neck supple.     Thyroid: No thyromegaly.  Cardiovascular:     Rate and Rhythm: Normal rate and regular rhythm.     Heart sounds: Normal heart sounds.  Pulmonary:     Effort: Pulmonary effort is normal.     Breath sounds: Normal breath sounds.  Abdominal:     Palpations: Abdomen is soft.  Musculoskeletal: Normal range of motion.     Comments: Mild pain with abduction of left shoulder.  Skin:    General: Skin is warm and dry.  Neurological:     General: No focal deficit  present.     Mental Status: She is alert and oriented to person, place, and time.     Deep Tendon Reflexes: Reflexes are normal and symmetric.  Psychiatric:        Mood and Affect: Mood normal.        Behavior: Behavior normal.        Thought Content: Thought content normal.        Judgment: Judgment normal.      No results found for any visits on 05/30/19.     Assessment & Plan    1. Essential hypertension On quinapril.  2. Hypercholesteremia On lipitor.  3. Type 2 diabetes mellitus without complication, without long-term current use of insulin (HCC)  - Hemoglobin A1c  4. Anemia, unspecified type  - CBC with Differential/Platelet  5. Left shoulder pain, unspecified chronicity May need referral in future. - meloxicam (MOBIC) 7.5 MG tablet; Take 1 tablet (7.5 mg total) by mouth daily.  Dispense: 30 tablet; Refill: 0  I have done the exam and reviewed the chart and it is accurate to the best of my knowledge. Development worker, community has been used and  any errors in dictation or transcription are unintentional. Miguel Aschoff M.D. [place], MD  [facility]

## 2019-05-31 LAB — CBC WITH DIFFERENTIAL/PLATELET
Basophils Absolute: 0.1 10*3/uL (ref 0.0–0.2)
Basos: 1 %
EOS (ABSOLUTE): 0.2 10*3/uL (ref 0.0–0.4)
Eos: 3 %
Hematocrit: 34.5 % (ref 34.0–46.6)
Hemoglobin: 11.3 g/dL (ref 11.1–15.9)
Immature Grans (Abs): 0 10*3/uL (ref 0.0–0.1)
Immature Granulocytes: 1 %
Lymphocytes Absolute: 1.7 10*3/uL (ref 0.7–3.1)
Lymphs: 27 %
MCH: 29 pg (ref 26.6–33.0)
MCHC: 32.8 g/dL (ref 31.5–35.7)
MCV: 89 fL (ref 79–97)
Monocytes Absolute: 0.6 10*3/uL (ref 0.1–0.9)
Monocytes: 9 %
Neutrophils Absolute: 3.9 10*3/uL (ref 1.4–7.0)
Neutrophils: 59 %
Platelets: 360 10*3/uL (ref 150–450)
RBC: 3.9 x10E6/uL (ref 3.77–5.28)
RDW: 14 % (ref 11.7–15.4)
WBC: 6.5 10*3/uL (ref 3.4–10.8)

## 2019-05-31 LAB — HEMOGLOBIN A1C
Est. average glucose Bld gHb Est-mCnc: 151 mg/dL
Hgb A1c MFr Bld: 6.9 % — ABNORMAL HIGH (ref 4.8–5.6)

## 2019-06-14 LAB — HM DIABETES EYE EXAM

## 2019-06-23 ENCOUNTER — Other Ambulatory Visit: Payer: Self-pay | Admitting: Family Medicine

## 2019-06-23 DIAGNOSIS — M25512 Pain in left shoulder: Secondary | ICD-10-CM

## 2019-06-26 ENCOUNTER — Other Ambulatory Visit: Payer: Self-pay | Admitting: Family Medicine

## 2019-06-26 DIAGNOSIS — I10 Essential (primary) hypertension: Secondary | ICD-10-CM

## 2019-07-04 ENCOUNTER — Other Ambulatory Visit: Payer: Self-pay | Admitting: Family Medicine

## 2019-07-04 DIAGNOSIS — E119 Type 2 diabetes mellitus without complications: Secondary | ICD-10-CM

## 2019-07-04 MED ORDER — ACCU-CHEK FASTCLIX LANCETS MISC: 3 refills | Status: DC

## 2019-07-04 NOTE — Telephone Encounter (Signed)
Needing a refill of 2 boxes of test lancets.  Please fill at:  [id number] - [place], [place] [phone] (Phone) [phone] (Fax)    Thanks, [facility]

## 2019-08-06 ENCOUNTER — Other Ambulatory Visit: Payer: Self-pay | Admitting: Family Medicine

## 2019-08-06 DIAGNOSIS — E119 Type 2 diabetes mellitus without complications: Secondary | ICD-10-CM

## 2019-08-24 ENCOUNTER — Other Ambulatory Visit: Payer: Self-pay | Admitting: Family Medicine

## 2019-08-24 DIAGNOSIS — M25512 Pain in left shoulder: Secondary | ICD-10-CM

## 2019-09-20 ENCOUNTER — Other Ambulatory Visit: Payer: Self-pay | Admitting: Family Medicine

## 2019-09-20 DIAGNOSIS — Z1231 Encounter for screening mammogram for malignant neoplasm of breast: Secondary | ICD-10-CM

## 2019-09-22 ENCOUNTER — Other Ambulatory Visit: Payer: Self-pay | Admitting: Family Medicine

## 2019-09-22 DIAGNOSIS — E7800 Pure hypercholesterolemia, unspecified: Secondary | ICD-10-CM

## 2019-09-27 NOTE — Progress Notes (Signed)
Patient: Jennifer Mcgee Female    DOB: 02/02/1941   78 y.o.   MRN: [id number] Visit Date: 10/01/2019  Today's Provider: Wilhemena Durie, MD   Chief Complaint  Patient presents with  . Follow-up  . Diabetes  . Hypertension  . Hyperlipidemia   Subjective:     HPI   Essential hypertension From 05/30/2019-On quinapril. Labs checked, stable.   Hypercholesteremia From 05/30/2019-On lipitor. Labs checked, stable.   Type 2 diabetes mellitus without complication, without long-term current use of insulin (HCC) From 05/30/2019-Hemoglobin A1c 6.9.  Home blood sugar readings 90-125.  Anemia, unspecified type From 05/30/2019- Labs checked, stable.   Left shoulder pain, unspecified chronicity From 05/30/2019-May need referral in future. - meloxicam (MOBIC) 7.5 MG tablet; Take 1 tablet (7.5 mg total) by mouth daily.     Allergies  Allergen Reactions  . Epinephrine Other (See Comments)    Feels jumpy Jerking movements all over the body.   . Penicillins Itching and Other (See Comments)    Has patient had a PCN reaction causing immediate rash, facial/tongue/throat swelling, SOB or lightheadedness with hypotension: Yes Has patient had a PCN reaction causing severe rash involving mucus membranes or skin necrosis: No Has patient had a PCN reaction that required hospitalization: No Has patient had a PCN reaction occurring within the last 10 years: No If all of the above answers are "NO", then may proceed with Cephalosporin use.   [place] [Levofloxacin In D5w] Anxiety     Current Outpatient Medications:  .  Accu-Chek FastClix Lancets MISC, Use to check fasting blood sugars once daily., Disp: 102 each, Rfl: 3 .  atorvastatin (LIPITOR) 20 MG tablet, TAKE 1 TABLET (20 MG TOTAL) BY MOUTH AT BEDTIME., Disp: 90 tablet, Rfl: 3 .  Cholecalciferol (VITAMIN D3) 2000 UNITS TABS, Take 2,000 Units by mouth daily. , Disp: , Rfl:  .  docusate sodium (COLACE) 100 MG capsule, Take  100 mg by mouth daily., Disp: , Rfl:  .  ferrous sulfate 325 (65 FE) MG tablet, Take 325 mg by mouth daily with breakfast. , Disp: , Rfl:  .  glipiZIDE (GLUCOTROL XL) 2.5 MG 24 hr tablet, Take 1 tablet by mouth once daily, Disp: 90 tablet, Rfl: 3 .  glucose blood test strip, Use with meter to check glucose daily, Disp: 100 each, Rfl: 6 .  hydrochlorothiazide (MICROZIDE) 12.5 MG capsule, TAKE 1 CAPSULE (12.5 MG TOTAL) BY MOUTH DAILY., Disp: 90 capsule, Rfl: 3 .  magnesium gluconate (MAGONATE) 500 MG tablet, Take 500 mg by mouth daily. , Disp: , Rfl:  .  meloxicam (MOBIC) 7.5 MG tablet, TAKE 1 TABLET BY MOUTH EVERY DAY, Disp: 30 tablet, Rfl: 0 .  metFORMIN (GLUCOPHAGE) 1000 MG tablet, TAKE 1 TABLET (1,000 MG TOTAL) BY MOUTH 2 (TWO) TIMES DAILY WITH A MEAL., Disp: 180 tablet, Rfl: 0 .  Multiple Vitamin (MULTIVITAMIN) tablet, Take 1 tablet by mouth daily., Disp: , Rfl:  .  Multiple Vitamins-Minerals (PRESERVISION AREDS 2 PO), Take 1 capsule by mouth 2 (two) times daily., Disp: , Rfl:  .  quinapril (ACCUPRIL) 40 MG tablet, TAKE 1 TABLET BY MOUTH EVERY DAY, Disp: 90 tablet, Rfl: 3 .  vitamin B-12 (CYANOCOBALAMIN) 1000 MCG tablet, Take 1,000 mcg by mouth daily., Disp: , Rfl:  .  acetaminophen (TYLENOL) 325 MG tablet, Take 650 mg by mouth every 6 (six) hours as needed., Disp: , Rfl:  .  enoxaparin (LOVENOX) 40 MG/0.4ML injection, Inject 0.4 mLs (40 mg  total) into the skin daily for 14 days., Disp: 14 Syringe, Rfl: 0 .  oxyCODONE (OXY IR/ROXICODONE) 5 MG immediate release tablet, Take 1 tablet (5 mg total) by mouth every 4 (four) hours as needed for moderate pain (pain score 4-6)., Disp: 30 tablet, Rfl: 0 .  traMADol (ULTRAM) 50 MG tablet, Take 1-2 tablets (50-100 mg total) by mouth every 6 (six) hours as needed for moderate pain., Disp: 60 tablet, Rfl: 0  Review of Systems  Constitutional: Negative for appetite change, chills, fatigue and fever.  HENT: Negative.   Eyes: Negative.   Respiratory: Negative  for chest tightness and shortness of breath.   Cardiovascular: Negative for chest pain and palpitations.  Gastrointestinal: Negative for abdominal pain, nausea and vomiting.  Endocrine: Negative.   Musculoskeletal:       Pain with abduction of left shoulder  Allergic/Immunologic: Negative.   Neurological: Negative for dizziness and weakness.  Psychiatric/Behavioral: Negative.     Social History   Tobacco Use  . Smoking status: Never Smoker  . Smokeless tobacco: Never Used  Substance Use Topics  . Alcohol use: No      Objective:   BP (!) 160/91 (BP Location: Left Arm, Patient Position: Sitting, Cuff Size: Large)   Pulse 82   Temp (!) 97.3 F (36.3 C) (Other (Comment))   Resp 16   Ht 5\' 3"  (1.6 m)   Wt 213 lb (96.6 kg)   SpO2 95%   BMI 37.73 kg/m  Vitals:   10/01/19 1013  BP: (!) 160/91  Pulse: 82  Resp: 16  Temp: (!) 97.3 F (36.3 C)  TempSrc: Other (Comment)  SpO2: 95%  Weight: 213 lb (96.6 kg)  Height: 5\' 3"  (1.6 m)  Body mass index is 37.73 kg/m.   Physical Exam Vitals signs reviewed.  Constitutional:      Appearance: She is well-developed. She is obese.  HENT:     Head: Normocephalic and atraumatic.     Right Ear: Tympanic membrane and external ear normal.     Left Ear: Tympanic membrane and external ear normal.     Nose: Nose normal.     Mouth/Throat:     Mouth: Mucous membranes are moist.     Pharynx: Oropharynx is clear.  Eyes:     General: No scleral icterus.    Extraocular Movements: Extraocular movements intact.     Conjunctiva/sclera: Conjunctivae normal.     Pupils: Pupils are equal, round, and reactive to light.  Neck:     Musculoskeletal: Normal range of motion and neck supple.     Thyroid: No thyromegaly.  Cardiovascular:     Rate and Rhythm: Normal rate and regular rhythm.     Heart sounds: Normal heart sounds.  Pulmonary:     Effort: Pulmonary effort is normal.     Breath sounds: Normal breath sounds.  Abdominal:      Palpations: Abdomen is soft.  Musculoskeletal: Normal range of motion.     Comments: Trace lower extremity edema. She has pain with abduction of her left shoulder past 90 degrees  Skin:    General: Skin is warm and dry.  Neurological:     General: No focal deficit present.     Mental Status: She is alert and oriented to person, place, and time. Mental status is at baseline.     Deep Tendon Reflexes: Reflexes are normal and symmetric.  Psychiatric:        Mood and Affect: Mood normal.  Behavior: Behavior normal.        Thought Content: Thought content normal.        Judgment: Judgment normal.      No results found for any visits on 10/01/19.     Assessment & Plan    1. Type 2 diabetes mellitus without complication, without long-term current use of insulin (HCC) On Lipitor - POCT glycosylated hemoglobin (Hb A1C)--6.8 today  2. Left shoulder pain, unspecified chronicity Impingement syndrome on exam.  Refer back to Dr. Marry Guan. - Ambulatory referral to Orthopedic Surgery  3. Essential hypertension On quinapril maximum dose.  Also on half dose HCTZ.  May go up on this .  4.HLD On Lipitor    Wilhemena Durie, MD  [facility]

## 2019-10-01 ENCOUNTER — Other Ambulatory Visit: Payer: Self-pay

## 2019-10-01 ENCOUNTER — Encounter: Payer: Self-pay | Admitting: Family Medicine

## 2019-10-01 ENCOUNTER — Ambulatory Visit (INDEPENDENT_AMBULATORY_CARE_PROVIDER_SITE_OTHER): Payer: Medicare Other | Admitting: Family Medicine

## 2019-10-01 VITALS — BP 160/91 | HR 82 | Temp 97.3°F | Resp 16 | Ht 63.0 in | Wt 213.0 lb

## 2019-10-01 DIAGNOSIS — I10 Essential (primary) hypertension: Secondary | ICD-10-CM | POA: Diagnosis not present

## 2019-10-01 DIAGNOSIS — M25512 Pain in left shoulder: Secondary | ICD-10-CM

## 2019-10-01 DIAGNOSIS — E119 Type 2 diabetes mellitus without complications: Secondary | ICD-10-CM | POA: Diagnosis not present

## 2019-10-01 LAB — POCT GLYCOSYLATED HEMOGLOBIN (HGB A1C)
Est. average glucose Bld gHb Est-mCnc: 148
Hemoglobin A1C: 6.8 % — AB (ref 4.0–5.6)

## 2019-10-01 MED ORDER — GLIPIZIDE ER 2.5 MG PO TB24: 2.5000 mg | ORAL_TABLET | Freq: Every day | ORAL | 3 refills | Status: DC

## 2019-10-01 NOTE — Patient Instructions (Signed)
Monitor BP readings at home. Goal is below 150/80.

## 2019-11-18 ENCOUNTER — Other Ambulatory Visit: Payer: Self-pay | Admitting: Family Medicine

## 2019-11-18 DIAGNOSIS — E119 Type 2 diabetes mellitus without complications: Secondary | ICD-10-CM

## 2019-11-26 ENCOUNTER — Other Ambulatory Visit: Payer: Self-pay | Admitting: Physician Assistant

## 2019-11-26 DIAGNOSIS — I10 Essential (primary) hypertension: Secondary | ICD-10-CM

## 2019-12-12 ENCOUNTER — Ambulatory Visit
Admission: RE | Admit: 2019-12-12 | Discharge: 2019-12-12 | Disposition: A | Discharge status: Home / Self Care | Payer: Medicare PPO | Source: Ambulatory Visit | Attending: Family Medicine | Admitting: Family Medicine

## 2019-12-12 DIAGNOSIS — Z1231 Encounter for screening mammogram for malignant neoplasm of breast: Secondary | ICD-10-CM | POA: Insufficient documentation

## 2019-12-13 LAB — HM DIABETES EYE EXAM

## 2019-12-23 ENCOUNTER — Other Ambulatory Visit: Payer: Self-pay | Admitting: Family Medicine

## 2019-12-23 DIAGNOSIS — E119 Type 2 diabetes mellitus without complications: Secondary | ICD-10-CM

## 2020-02-18 NOTE — Progress Notes (Signed)
Patient: Jennifer Mcgee, Female    DOB: December 27, 1940, 79 y.o.   MRN: [id number] Visit Date: 02/18/2020  Today's Provider: Wilhemena Durie, MD   No chief complaint on file.  Subjective:     Annual wellness visit Jennifer Mcgee is a 79 y.o. female. She feels fairly well. She reports she is exercising. She reports she is sleeping fairly well. She has had both Covid shots and overall she feels very well. He had a partial hysterectomy in 1981. Review of systems her only complaint today is 1 of mild external vaginal itching on occasion. She does have a history of colonic polyps. -----------------------------------------------------------  Colonoscopy: 04/28/2016 Mammogram: 12/12/2019  Review of Systems  Constitutional: Negative.   HENT: Negative.   Respiratory: Negative.   Cardiovascular: Negative.   Gastrointestinal: Negative.   Endocrine: Negative.   Genitourinary: Positive for enuresis.  Musculoskeletal: Positive for arthralgias and back pain.  Skin: Negative.   Allergic/Immunologic: Negative.   Neurological: Negative.   Hematological: Negative.   Psychiatric/Behavioral: Negative.     Social History   Socioeconomic History  . Marital status: Married    Spouse name: Not on file  . Number of children: Not on file  . Years of education: Not on file  . Highest education level: Not on file  Occupational History  . Not on file  Tobacco Use  . Smoking status: Never Smoker  . Smokeless tobacco: Never Used  Substance and Sexual Activity  . Alcohol use: No  . Drug use: No  . Sexual activity: Not on file  Other Topics Concern  . Not on file  Social History Narrative  . Not on file   Social Determinants of Health   Financial Resource Strain:   . Difficulty of Paying Living Expenses:   Food Insecurity:   . Worried About Charity fundraiser in the Last Year:   . Arboriculturist in the Last Year:   Transportation Needs:   . Film/video editor (Medical):    Marland Kitchen Lack of Transportation (Non-Medical):   Physical Activity:   . Days of Exercise per Week:   . Minutes of Exercise per Session:   Stress:   . Feeling of Stress :   Social Connections:   . Frequency of Communication with Friends and Family:   . Frequency of Social Gatherings with Friends and Family:   . Attends Religious Services:   . Active Member of Clubs or Organizations:   . Attends Archivist Meetings:   Marland Kitchen Marital Status:   Intimate Partner Violence:   . Fear of Current or Ex-Partner:   . Emotionally Abused:   Marland Kitchen Physically Abused:   . Sexually Abused:     Past Medical History:  Diagnosis Date  . Anemia   . Arthritis    degenerative arthritis left knee  . Chicken pox   . Colon polyp   . Diabetes mellitus without complication ([place])   . Hyperlipidemia   . Hypertension   . Macular degeneration      Patient Active Problem List   Diagnosis Date Noted  . S/P total knee arthroplasty 07/05/2018  . Bronchitis 09/30/2015  . Adaptation reaction 04/03/2015  . At risk of disease 04/03/2015  . 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 Back pain, chronic 04/03/2015  . Colon polyp 04/03/2015  . Diabetes ([place]) 04/03/2015  . 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 Gout 04/03/2015  . Blood in the urine 04/03/2015  . Calcium blood  increased 04/03/2015  . Hypercholesteremia 04/03/2015  . High sodium levels 04/03/2015  . BP (high blood pressure) 04/03/2015  . 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 Arthritis, degenerative 04/03/2015  . RAD (reactive airway disease) 04/03/2015  . Diverticulitis of colon without hemorrhage 04/02/2013  . Personal history of colonic polyps 03/09/2013    Past Surgical History:  Procedure Laterality Date  . ABDOMINAL HYSTERECTOMY  1981  . APPENDECTOMY  07/2004   Dr. Jamal Collin  . BACK SURGERY     L4-5; fusion  . BREAST EXCISIONAL BIOPSY    . BREAST SURGERY     biopsy- benign  . BREAST SURGERY  1977   duct operation  . CATARACT EXTRACTION W/  INTRAOCULAR LENS  IMPLANT, BILATERAL    . COLONOSCOPY  03-28-13   Dr Jamal Collin  . COLONOSCOPY WITH PROPOFOL N/A 04/28/2016   Procedure: COLONOSCOPY WITH PROPOFOL;  Surgeon: Christene Lye, MD;  Location: [facility] ENDOSCOPY;  Service: Endoscopy;  Laterality: N/A;  . EXCISION / BIOPSY BREAST / NIPPLE / DUCT Left 01/20/1973   milk duct removed  . EYE SURGERY Bilateral 2013   cataract  . KNEE ARTHROPLASTY Left 07/05/2018   Procedure: COMPUTER ASSISTED TOTAL KNEE ARTHROPLASTY;  Surgeon: Dereck Leep, MD;  Location: [facility] ORS;  Service: Orthopedics;  Laterality: Left;    Her family history includes Breast cancer (age of onset: 35) in her mother; COPD in her mother; Cancer (age of onset: 12) in her mother; Dementia in her father; Diabetes in her paternal grandmother; Heart attack in her paternal grandfather; Heart disease in her mother; Hyperlipidemia in her mother; Hypertension in her father and mother.   Current Outpatient Medications:  .  Accu-Chek FastClix Lancets MISC, Use to check fasting blood sugars once daily., Disp: 102 each, Rfl: 3 .  ACCU-CHEK SMARTVIEW test strip, USE WITH METER TO CHECK GLUCOSE DAILY, Disp: 100 strip, Rfl: 6 .  acetaminophen (TYLENOL) 325 MG tablet, Take 650 mg by mouth every 6 (six) hours as needed., Disp: , Rfl:  .  atorvastatin (LIPITOR) 20 MG tablet, TAKE 1 TABLET (20 MG TOTAL) BY MOUTH AT BEDTIME., Disp: 90 tablet, Rfl: 3 .  Cholecalciferol (VITAMIN D3) 2000 UNITS TABS, Take 2,000 Units by mouth daily. , Disp: , Rfl:  .  docusate sodium (COLACE) 100 MG capsule, Take 100 mg by mouth daily., Disp: , Rfl:  .  enoxaparin (LOVENOX) 40 MG/0.4ML injection, Inject 0.4 mLs (40 mg total) into the skin daily for 14 days., Disp: 14 Syringe, Rfl: 0 .  ferrous sulfate 325 (65 FE) MG tablet, Take 325 mg by mouth daily with breakfast. , Disp: , Rfl:  .  glipiZIDE (GLUCOTROL XL) 2.5 MG 24 hr tablet, Take 1 tablet (2.5 mg total) by mouth daily., Disp: 90 tablet, Rfl: 3 .   hydrochlorothiazide (MICROZIDE) 12.5 MG capsule, TAKE 1 CAPSULE BY MOUTH EVERY DAY, Disp: 90 capsule, Rfl: 3 .  magnesium gluconate (MAGONATE) 500 MG tablet, Take 500 mg by mouth daily. , Disp: , Rfl:  .  meloxicam (MOBIC) 7.5 MG tablet, TAKE 1 TABLET BY MOUTH EVERY DAY, Disp: 30 tablet, Rfl: 0 .  metFORMIN (GLUCOPHAGE) 1000 MG tablet, TAKE 1 TABLET (1,000 MG TOTAL) BY MOUTH 2 (TWO) TIMES DAILY WITH A MEAL., Disp: 180 tablet, Rfl: 1 .  Multiple Vitamin (MULTIVITAMIN) tablet, Take 1 tablet by mouth daily., Disp: , Rfl:  .  Multiple Vitamins-Minerals (PRESERVISION AREDS 2 PO), Take 1 capsule by mouth 2 (two) times daily., Disp: , Rfl:  .  oxyCODONE (OXY  IR/ROXICODONE) 5 MG immediate release tablet, Take 1 tablet (5 mg total) by mouth every 4 (four) hours as needed for moderate pain (pain score 4-6)., Disp: 30 tablet, Rfl: 0 .  quinapril (ACCUPRIL) 40 MG tablet, TAKE 1 TABLET BY MOUTH EVERY DAY, Disp: 90 tablet, Rfl: 3 .  traMADol (ULTRAM) 50 MG tablet, Take 1-2 tablets (50-100 mg total) by mouth every 6 (six) hours as needed for moderate pain., Disp: 60 tablet, Rfl: 0 .  vitamin B-12 (CYANOCOBALAMIN) 1000 MCG tablet, Take 1,000 mcg by mouth daily., Disp: , Rfl:   Patient Care Team: Jerrol Banana., MD as PCP - General (Family Medicine) Christene Lye, MD (General Surgery) Earnestine Leys, MD as Consulting Physician (Specialist) Ralene Bathe, MD as Consulting Physician (Dermatology)    Objective:    Vitals: There were no vitals taken for this visit.  Physical Exam Vitals reviewed.  Constitutional:      Appearance: She is well-developed. She is obese.  HENT:     Head: Normocephalic and atraumatic.     Right Ear: Tympanic membrane and external ear normal.     Left Ear: Tympanic membrane and external ear normal.     Nose: Nose normal.     Mouth/Throat:     Mouth: Mucous membranes are moist.     Pharynx: Oropharynx is clear.  Eyes:     General: No scleral icterus.     Extraocular Movements: Extraocular movements intact.     Conjunctiva/sclera: Conjunctivae normal.     Pupils: Pupils are equal, round, and reactive to light.  Neck:     Thyroid: No thyromegaly.  Cardiovascular:     Rate and Rhythm: Normal rate and regular rhythm.     Heart sounds: Normal heart sounds.  Pulmonary:     Effort: Pulmonary effort is normal.     Breath sounds: Normal breath sounds.  Chest:     Breasts:        Right: Normal.        Left: Normal.  Abdominal:     Palpations: Abdomen is soft.  Genitourinary:    General: Normal vulva.     Vagina: No vaginal discharge.     Comments: Normal external genitalia, mildly atrophic vaginal introitus. Musculoskeletal:        General: Normal range of motion.     Cervical back: Normal range of motion and neck supple.     Comments: Trace lower extremity edema.  Skin:    General: Skin is warm and dry.  Neurological:     General: No focal deficit present.     Mental Status: She is alert and oriented to person, place, and time. Mental status is at baseline.     Deep Tendon Reflexes: Reflexes are normal and symmetric.  Psychiatric:        Mood and Affect: Mood normal.        Behavior: Behavior normal.        Thought Content: Thought content normal.        Judgment: Judgment normal.     Activities of Daily Living No flowsheet data found.  Fall Risk Assessment Fall Risk  09/26/2017 08/31/2017 05/18/2016 11/05/2015 06/04/2015  Falls in the past year? No No No No No     Depression Screen PHQ 2/9 Scores 01/29/2019 09/26/2017 08/31/2017 05/18/2016  PHQ - 2 Score 0 0 0 0    No flowsheet data found.    Assessment & Plan:     Annual Wellness Visit  Reviewed  patient's Family Medical History Reviewed and updated list of patient's medical providers Assessment of cognitive impairment was done Assessed patient's functional ability Established a written schedule for health screening [place] Completed and  Reviewed  Exercise Activities and Dietary recommendations Goals    . Reduce calorie intake to 2000 calories per day     Recommend monitoring calorie intake and avoiding going over 2000 calories a day.        Immunization History  Administered Date(s) Administered  . Influenza, High Dose Seasonal PF 09/17/2017, 09/13/2019  . Influenza,inj,quad, With Preservative 12/06/2016  . Influenza-Unspecified 10/21/2015, 01/01/2016  . Pneumococcal Conjugate-13 03/22/2014  . Pneumococcal-Unspecified 09/03/2013  . Tdap 10/25/2014  . Zoster 11/01/2011  . Zoster Recombinat (Shingrix) 09/13/2019    Health Maintenance  Topic Date Due  . FOOT EXAM  10/04/2017  . HEMOGLOBIN A1C  03/31/2020  . OPHTHALMOLOGY EXAM  12/12/2020  . TETANUS/TDAP  10/25/2024  . INFLUENZA VACCINE  Completed  . DEXA SCAN  Completed  . PNA vac Low Risk Adult  Completed     Discussed health benefits of physical activity, and encouraged her to engage in regular exercise appropriate for her age and condition.   1. Annual physical exam   2. Type 2 diabetes mellitus without complication, without long-term current use of insulin (HCC) Good control with Metformin and glipizide - Hemoglobin A1c - Comprehensive metabolic panel - TSH - Lipid panel - CBC with Differential/Platelet  3. Hypercholesteremia Last LDL was 80 on Lipitor - Hemoglobin A1c - Comprehensive metabolic panel - TSH - Lipid panel - CBC with Differential/Platelet  4. Essential hypertension Controlled with HCTZ, quinapril. - Hemoglobin A1c - Comprehensive metabolic panel - TSH - Lipid panel - CBC with Differential/Platelet  5. Anemia, unspecified type  - Hemoglobin A1c - Comprehensive metabolic panel - TSH - Lipid panel - CBC with Differential/Platelet  6. Vaginal itching Wet prep is negative.  Is minimally symptomatic so we will follow atrophic vaginitis. - POCT Wet Prep [facility])   ------------------------------------------------------------------------------------------------------------    Wilhemena Durie, MD  [facility]

## 2020-02-19 ENCOUNTER — Ambulatory Visit (INDEPENDENT_AMBULATORY_CARE_PROVIDER_SITE_OTHER): Payer: Medicare PPO | Admitting: Family Medicine

## 2020-02-19 ENCOUNTER — Encounter: Payer: Self-pay | Admitting: Family Medicine

## 2020-02-19 ENCOUNTER — Other Ambulatory Visit: Payer: Self-pay

## 2020-02-19 VITALS — BP 142/80 | HR 87 | Temp 95.0°F | Wt 210.6 lb

## 2020-02-19 DIAGNOSIS — D649 Anemia, unspecified: Secondary | ICD-10-CM

## 2020-02-19 DIAGNOSIS — N898 Other specified noninflammatory disorders of vagina: Secondary | ICD-10-CM | POA: Diagnosis not present

## 2020-02-19 DIAGNOSIS — E119 Type 2 diabetes mellitus without complications: Secondary | ICD-10-CM | POA: Diagnosis not present

## 2020-02-19 DIAGNOSIS — I10 Essential (primary) hypertension: Secondary | ICD-10-CM | POA: Diagnosis not present

## 2020-02-19 DIAGNOSIS — E7800 Pure hypercholesterolemia, unspecified: Secondary | ICD-10-CM | POA: Diagnosis not present

## 2020-02-19 DIAGNOSIS — Z0000 Encounter for general adult medical examination without abnormal findings: Secondary | ICD-10-CM | POA: Diagnosis not present

## 2020-02-19 LAB — POCT WET PREP (WET MOUNT)

## 2020-02-22 LAB — CBC WITH DIFFERENTIAL/PLATELET
Basophils Absolute: 0.1 10*3/uL (ref 0.0–0.2)
Basos: 1 %
EOS (ABSOLUTE): 0.2 10*3/uL (ref 0.0–0.4)
Eos: 3 %
Hematocrit: 35.6 % (ref 34.0–46.6)
Hemoglobin: 11.6 g/dL (ref 11.1–15.9)
Immature Grans (Abs): 0 10*3/uL (ref 0.0–0.1)
Immature Granulocytes: 0 %
Lymphocytes Absolute: 2.3 10*3/uL (ref 0.7–3.1)
Lymphs: 38 %
MCH: 29.2 pg (ref 26.6–33.0)
MCHC: 32.6 g/dL (ref 31.5–35.7)
MCV: 90 fL (ref 79–97)
Monocytes Absolute: 0.7 10*3/uL (ref 0.1–0.9)
Monocytes: 11 %
Neutrophils Absolute: 2.8 10*3/uL (ref 1.4–7.0)
Neutrophils: 47 %
Platelets: 388 10*3/uL (ref 150–450)
RBC: 3.97 x10E6/uL (ref 3.77–5.28)
RDW: 13.4 % (ref 11.7–15.4)
WBC: 6.1 10*3/uL (ref 3.4–10.8)

## 2020-02-22 LAB — COMPREHENSIVE METABOLIC PANEL
ALT: 21 IU/L (ref 0–32)
AST: 23 IU/L (ref 0–40)
Albumin/Globulin Ratio: 2.1 (ref 1.2–2.2)
Albumin: 4.4 g/dL (ref 3.7–4.7)
Alkaline Phosphatase: 79 IU/L (ref 39–117)
BUN/Creatinine Ratio: 22 (ref 12–28)
BUN: 19 mg/dL (ref 8–27)
Bilirubin Total: 0.7 mg/dL (ref 0.0–1.2)
CO2: 24 mmol/L (ref 20–29)
Calcium: 9.4 mg/dL (ref 8.7–10.3)
Chloride: 100 mmol/L (ref 96–106)
Creatinine, Ser: 0.88 mg/dL (ref 0.57–1.00)
GFR calc Af Amer: 73 mL/min/{1.73_m2} (ref >59)
GFR calc non Af Amer: 63 mL/min/{1.73_m2} (ref >59)
Globulin, Total: 2.1 g/dL (ref 1.5–4.5)
Glucose: 133 mg/dL — ABNORMAL HIGH (ref 65–99)
Potassium: 4.4 mmol/L (ref 3.5–5.2)
Sodium: 140 mmol/L (ref 134–144)
Total Protein: 6.5 g/dL (ref 6.0–8.5)

## 2020-02-22 LAB — LIPID PANEL
Chol/HDL Ratio: 2.8 ratio (ref 0.0–4.4)
Cholesterol, Total: 168 mg/dL (ref 100–199)
HDL: 61 mg/dL (ref >39)
LDL Chol Calc (NIH): 85 mg/dL (ref 0–99)
Triglycerides: 123 mg/dL (ref 0–149)
VLDL Cholesterol Cal: 22 mg/dL (ref 5–40)

## 2020-02-22 LAB — TSH: TSH: 3.06 u[IU]/mL (ref 0.450–4.500)

## 2020-02-22 LAB — HEMOGLOBIN A1C
Est. average glucose Bld gHb Est-mCnc: 146 mg/dL
Hgb A1c MFr Bld: 6.7 % — ABNORMAL HIGH (ref 4.8–5.6)

## 2020-02-25 ENCOUNTER — Telehealth: Payer: Self-pay

## 2020-02-25 NOTE — Telephone Encounter (Signed)
-----   Message from Jerrol Banana., MD sent at 02/25/2020  8:12 AM EDT ----- Labs stable.  Diabetes stable

## 2020-02-25 NOTE — Telephone Encounter (Signed)
Patient advised.

## 2020-03-13 ENCOUNTER — Ambulatory Visit (INDEPENDENT_AMBULATORY_CARE_PROVIDER_SITE_OTHER): Payer: Medicare PPO | Admitting: Dermatology

## 2020-03-13 ENCOUNTER — Other Ambulatory Visit: Payer: Self-pay

## 2020-03-13 ENCOUNTER — Encounter: Payer: Self-pay | Admitting: Dermatology

## 2020-03-13 DIAGNOSIS — Z85828 Personal history of other malignant neoplasm of skin: Secondary | ICD-10-CM | POA: Diagnosis not present

## 2020-03-13 DIAGNOSIS — Z1283 Encounter for screening for malignant neoplasm of skin: Secondary | ICD-10-CM | POA: Diagnosis not present

## 2020-03-13 DIAGNOSIS — L578 Other skin changes due to chronic exposure to nonionizing radiation: Secondary | ICD-10-CM | POA: Diagnosis not present

## 2020-03-13 DIAGNOSIS — I781 Nevus, non-neoplastic: Secondary | ICD-10-CM

## 2020-03-13 DIAGNOSIS — L821 Other seborrheic keratosis: Secondary | ICD-10-CM

## 2020-03-13 DIAGNOSIS — D229 Melanocytic nevi, unspecified: Secondary | ICD-10-CM | POA: Diagnosis not present

## 2020-03-13 DIAGNOSIS — L918 Other hypertrophic disorders of the skin: Secondary | ICD-10-CM

## 2020-03-13 DIAGNOSIS — D1800 Hemangioma unspecified site: Secondary | ICD-10-CM

## 2020-03-13 DIAGNOSIS — L820 Inflamed seborrheic keratosis: Secondary | ICD-10-CM | POA: Diagnosis not present

## 2020-03-13 DIAGNOSIS — D692 Other nonthrombocytopenic purpura: Secondary | ICD-10-CM

## 2020-03-13 NOTE — Progress Notes (Signed)
   Follow-Up Visit   Subjective  Jennifer Mcgee is a 79 y.o. female who presents for the following: Annual Exam (hx SCC R chest parasternal) and check spot (abdomen, irritated by clothes). The patient presents for total body skin exam for skin cancer screening and history of skin cancer treated in the past.  The following portions of the chart were reviewed this encounter and updated as appropriate:     Review of Systems: No other skin or systemic complaints.  Objective  Well appearing patient in no apparent distress; mood and affect are within normal limits.  A full examination was performed including scalp, head, eyes, ears, nose, lips, neck, chest, axillae, abdomen, back, buttocks, bilateral upper extremities, bilateral lower extremities, hands, feet, fingers, toes, fingernails, and toenails. All findings within normal limits unless otherwise noted below.  Objective  R chest parasternal: Well healed scar with no evidence of recurrence, no lymphadenopathy.   Objective  Left Abdomen (side) - Lower: Erythematous keratotic or waxy stuck-on papule or plaque.   Objective  bil axilla: Fleshy, skin-colored pedunculated papules.    Assessment & Plan   Actinic Damage - diffuse scaly erythematous macules with underlying dyspigmentation - Recommend daily broad spectrum sunscreen SPF 30+ to sun-exposed areas, reapply every 2 hours as needed.  - Call for new or changing lesions.  Skin cancer screening performed today. Melanocytic Nevi - Tan-brown and/or pink-flesh-colored symmetric macules and papules - Benign appearing on exam today - Observation - Call clinic for new or changing moles - Recommend daily use of broad spectrum spf 30+ sunscreen to sun-exposed areas.   Hemangiomas - Red papules - Discussed benign nature - Observe - Call for any changes  Purpura - Violaceous macules and patches - Benign - Related to age, sun damage and/or use of blood thinners - Observe - Can  use OTC arnica containing moisturizer such as Dermend Bruise Formula if desired - Call for worsening or other concerns  Telangiectasia - Dilated blood vessel - Benign appearing on exam - Call for changes  Seborrheic Keratoses - Stuck-on, waxy, tan-brown papules and plaques  - Discussed benign etiology and prognosis. 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 Observe for recurrence. Call clinic for new or changing lesions.  Recommend regular skin exams, daily broad-spectrum spf 30+ sunscreen use, and photoprotection.     Inflamed seborrheic keratosis Left Abdomen (side) - Lower  Destruction of lesion - Left Abdomen (side) - Lower Complexity: simple   Destruction method: cryotherapy   Informed consent: discussed and consent obtained   Timeout:  patient name, date of birth, surgical site, and procedure verified Lesion destroyed using liquid nitrogen: Yes   Region frozen until ice ball extended beyond lesion: Yes   Outcome: patient tolerated procedure well with no complications   Post-procedure details: wound care instructions given    Skin tag bil axilla  Benign, observe.    Return in about 1 year (around 03/13/2021) for TBSE.   I, Othelia Pulling, RMA, am acting as scribe for Sarina Ser, MD .

## 2020-05-22 ENCOUNTER — Other Ambulatory Visit: Payer: Self-pay | Admitting: Family Medicine

## 2020-05-22 DIAGNOSIS — E119 Type 2 diabetes mellitus without complications: Secondary | ICD-10-CM

## 2020-05-30 ENCOUNTER — Other Ambulatory Visit: Payer: Self-pay | Admitting: Family Medicine

## 2020-05-30 DIAGNOSIS — E119 Type 2 diabetes mellitus without complications: Secondary | ICD-10-CM

## 2020-05-30 NOTE — Telephone Encounter (Signed)
Requested Prescriptions  Pending Prescriptions Disp Refills  . Accu-Chek FastClix Lancets MISC [Pharmacy Med Name: ACCU-CHEK FASTCLIX LANCET DRUM] 102 each 3    Sig: USE TO CHECK FASTING BLOOD SUGARS ONCE DAILY.     Endocrinology: Diabetes - Testing Supplies Passed - 05/30/2020  8:45 AM      Passed - Valid encounter within last 12 months    Recent Outpatient Visits          3 months ago Annual physical exam   [facility] Jerrol Banana., MD   8 months ago Type 2 diabetes mellitus without complication, without long-term current use of insulin [facility])   [facility] Jerrol Banana., MD   1 year ago Essential hypertension   [facility] Jerrol Banana., MD   1 year ago Encounter for annual physical examination excluding gynecological examination in a patient older than 58 years   [facility] Rosanna Randy, Retia Passe., MD   2 years ago Type 2 diabetes mellitus without complication, without long-term current use of insulin [facility])   [facility] Jerrol Banana., MD      Future Appointments            In 2 months Jerrol Banana., MD [facility], [place]

## 2020-06-12 LAB — HM DIABETES EYE EXAM

## 2020-06-20 ENCOUNTER — Other Ambulatory Visit: Payer: Self-pay | Admitting: Family Medicine

## 2020-06-20 DIAGNOSIS — I10 Essential (primary) hypertension: Secondary | ICD-10-CM

## 2020-08-20 NOTE — Progress Notes (Signed)
Trena Platt Siena Poehler,acting as a scribe for Wilhemena Durie, MD.,have documented all relevant documentation on the behalf of Wilhemena Durie, MD,as directed by  Wilhemena Durie, MD while in the presence of Wilhemena Durie, MD.  Established patient visit   Patient: FREDDA Mcgee   DOB: 10-24-1941   79 y.o. Female  MRN: [id number] Visit Date: 08/21/2020  Today's healthcare provider: Wilhemena Durie, MD   Chief Complaint  Patient presents with  . Diabetes  . Hypertension   Subjective    HPI  Patient feels well recently. She has no complaints. She needs follow-up on her history of anemia. Diabetes Mellitus Type II, Follow-up  Lab Results  Component Value Date   HGBA1C 6.7 (H) 02/21/2020   HGBA1C 6.8 (A) 10/01/2019   HGBA1C 6.9 (H) 05/30/2019   Wt Readings from Last 3 Encounters:  08/21/20 210 lb 9.6 oz (95.5 kg)  02/19/20 210 lb 9.6 oz (95.5 kg)  10/01/19 213 lb (96.6 kg)   Last seen for diabetes 6 months ago.  Management since then includes no changes, controlled on metformin and glipizide. She reports excellent compliance with treatment. She is not having side effects.   Home blood sugar records: fasting range: 130  Episodes of hypoglycemia? No    Current insulin regiment: none Most Recent Eye Exam: 06/12/20 Current exercise: cardiovascular workout on exercise equipment and walking Current diet habits: Regular  Pertinent Labs: Lab Results  Component Value Date   CHOL 168 02/21/2020   HDL 61 02/21/2020   LDLCALC 85 02/21/2020   TRIG 123 02/21/2020   CHOLHDL 2.8 02/21/2020   Lab Results  Component Value Date   NA 140 02/21/2020   K 4.4 02/21/2020   CREATININE 0.88 02/21/2020   GFRNONAA 63 02/21/2020   GFRAA 73 02/21/2020   GLUCOSE 133 (H) 02/21/2020     --------------------------------------------------------------------------------------------------- Hypertension, follow-up  BP Readings from Last 3 Encounters:  08/21/20 (!) 158/83    02/19/20 (!) 142/80  10/01/19 (!) 160/91   Wt Readings from Last 3 Encounters:  08/21/20 210 lb 9.6 oz (95.5 kg)  02/19/20 210 lb 9.6 oz (95.5 kg)  10/01/19 213 lb (96.6 kg)     She was last seen for hypertension 6 months ago.  BP at that visit was 142/80. Management since that visit includes no changes.  She reports excellent compliance with treatment. She is not having side effects.  She is following a Regular, Low fat diet. She is exercising. She does not smoke.  Use of agents associated with hypertension: none.   Outside blood pressures are not being checked. Pertinent labs: Lab Results  Component Value Date   CHOL 168 02/21/2020   HDL 61 02/21/2020   LDLCALC 85 02/21/2020   TRIG 123 02/21/2020   CHOLHDL 2.8 02/21/2020   Lab Results  Component Value Date   NA 140 02/21/2020   K 4.4 02/21/2020   CREATININE 0.88 02/21/2020   GFRNONAA 63 02/21/2020   GFRAA 73 02/21/2020   GLUCOSE 133 (H) 02/21/2020     The 10-year ASCVD risk score Mikey Bussing DC Jr., et al., 2013) is: 63.4%   --------------------------------------------------------------------------------------------------- Hypercholesteremia- 02/19/20 Last LDL was 80 on Lipitor      Medications: Outpatient Medications Prior to Visit  Medication Sig  . Accu-Chek FastClix Lancets MISC USE TO CHECK FASTING BLOOD SUGARS ONCE DAILY.  Marland Kitchen ACCU-CHEK SMARTVIEW test strip USE WITH METER TO CHECK GLUCOSE DAILY  . atorvastatin (LIPITOR) 20 MG tablet TAKE 1 TABLET (20  MG TOTAL) BY MOUTH AT BEDTIME.  Marland Kitchen docusate sodium (COLACE) 100 MG capsule Take 100 mg by mouth daily.  . ferrous sulfate 325 (65 FE) MG tablet Take 325 mg by mouth daily with breakfast.   . glipiZIDE (GLUCOTROL XL) 2.5 MG 24 hr tablet Take 1 tablet (2.5 mg total) by mouth daily.  . hydrochlorothiazide (MICROZIDE) 12.5 MG capsule TAKE 1 CAPSULE BY MOUTH EVERY DAY  . magnesium gluconate (MAGONATE) 500 MG tablet Take 500 mg by mouth daily.   . metFORMIN (GLUCOPHAGE)  1000 MG tablet TAKE 1 TABLET (1,000 MG TOTAL) BY MOUTH 2 (TWO) TIMES DAILY WITH A MEAL.  . Multiple Vitamin (MULTIVITAMIN) tablet Take 1 tablet by mouth daily.  . Multiple Vitamins-Minerals (PRESERVISION AREDS 2 PO) Take 1 capsule by mouth 2 (two) times daily.  . quinapril (ACCUPRIL) 40 MG tablet TAKE 1 TABLET BY MOUTH EVERY DAY  . vitamin B-12 (CYANOCOBALAMIN) 1000 MCG tablet Take 1,000 mcg by mouth daily.  Marland Kitchen acetaminophen (TYLENOL) 325 MG tablet Take 650 mg by mouth every 6 (six) hours as needed.  . Cholecalciferol (VITAMIN D3) 2000 UNITS TABS Take 2,000 Units by mouth daily.   . [DISCONTINUED] enoxaparin (LOVENOX) 40 MG/0.4ML injection Inject 0.4 mLs (40 mg total) into the skin daily for 14 days.  . [DISCONTINUED] meloxicam (MOBIC) 7.5 MG tablet TAKE 1 TABLET BY MOUTH EVERY DAY (Patient not taking: Reported on 02/19/2020)  . [DISCONTINUED] oxyCODONE (OXY IR/ROXICODONE) 5 MG immediate release tablet Take 1 tablet (5 mg total) by mouth every 4 (four) hours as needed for moderate pain (pain score 4-6).  . [DISCONTINUED] traMADol (ULTRAM) 50 MG tablet Take 1-2 tablets (50-100 mg total) by mouth every 6 (six) hours as needed for moderate pain.   No facility-administered medications prior to visit.    Review of Systems  Last hemoglobin A1c Lab Results  Component Value Date   HGBA1C 6.7 (H) 08/21/2020      Objective    BP (!) 158/83 (BP Location: Right Wrist, Patient Position: Sitting, Cuff Size: Large)   Pulse 92   Temp 98.2 F (36.8 C) (Oral)   Ht 5\' 3"  (1.6 m)   Wt 210 lb 9.6 oz (95.5 kg)   BMI 37.31 kg/m  BP Readings from Last 3 Encounters:  08/21/20 (!) 158/83  02/19/20 (!) 142/80  10/01/19 (!) 160/91   Wt Readings from Last 3 Encounters:  08/21/20 210 lb 9.6 oz (95.5 kg)  02/19/20 210 lb 9.6 oz (95.5 kg)  10/01/19 213 lb (96.6 kg)      Physical Exam Vitals reviewed.  Constitutional:      Appearance: She is well-developed. She is obese.  HENT:     Head: Normocephalic  and atraumatic.     Right Ear: Tympanic membrane and external ear normal.     Left Ear: Tympanic membrane and external ear normal.     Nose: Nose normal.     Mouth/Throat:     Mouth: Mucous membranes are moist.     Pharynx: Oropharynx is clear.  Eyes:     General: No scleral icterus.    Extraocular Movements: Extraocular movements intact.     Conjunctiva/sclera: Conjunctivae normal.     Pupils: Pupils are equal, round, and reactive to light.  Neck:     Thyroid: No thyromegaly.  Cardiovascular:     Rate and Rhythm: Normal rate and regular rhythm.  Pulmonary:     Effort: Pulmonary effort is normal.     Breath sounds: Normal breath sounds.  Abdominal:     Palpations: Abdomen is soft.  Musculoskeletal:        General: Normal range of motion.     Cervical back: Normal range of motion and neck supple.     Comments: Trace lower extremity edema. She has pain with abduction of her left shoulder past 90 degrees  Skin:    General: Skin is warm and dry.  Neurological:     General: No focal deficit present.     Mental Status: She is alert and oriented to person, place, and time. Mental status is at baseline.     Deep Tendon Reflexes: Reflexes are normal and symmetric.  Psychiatric:        Mood and Affect: Mood normal.        Behavior: Behavior normal.        Thought Content: Thought content normal.        Judgment: Judgment normal.       No results found for any visits on 08/21/20.  Assessment & Plan      1. Type 2 diabetes mellitus without complication, without long-term current use of insulin (HCC) Consider stopping glipizide in the future if control remains good. Try to avoid hypoglycemia as she gets older. - POCT HgB A1C - CBC with Differential/Platelet - Hemoglobin A1c  2. Hypercholesteremia On atorvastatin 20  3. Essential hypertension Controlled with quinapril and HCTZ  4. Anemia, unspecified type Follow-up CBC. - CBC with Differential/Platelet - Hemoglobin  A1c   Return in about 6 months (around 02/18/2021).      I, Wilhemena Durie, MD, have reviewed all documentation for this visit. The documentation on 08/26/20 for the exam, diagnosis, procedures, and orders are all accurate and complete.    Richard Cranford Mon, MD  [facility] [phone] (phone) [phone] (fax)  [place]

## 2020-08-21 ENCOUNTER — Other Ambulatory Visit: Payer: Self-pay

## 2020-08-21 ENCOUNTER — Ambulatory Visit (INDEPENDENT_AMBULATORY_CARE_PROVIDER_SITE_OTHER): Payer: Medicare PPO | Admitting: Family Medicine

## 2020-08-21 ENCOUNTER — Encounter: Payer: Self-pay | Admitting: Family Medicine

## 2020-08-21 ENCOUNTER — Other Ambulatory Visit: Payer: Self-pay | Admitting: Family Medicine

## 2020-08-21 VITALS — BP 158/83 | HR 92 | Temp 98.2°F | Ht 63.0 in | Wt 210.6 lb

## 2020-08-21 DIAGNOSIS — E119 Type 2 diabetes mellitus without complications: Secondary | ICD-10-CM

## 2020-08-21 DIAGNOSIS — E7800 Pure hypercholesterolemia, unspecified: Secondary | ICD-10-CM | POA: Diagnosis not present

## 2020-08-21 DIAGNOSIS — D649 Anemia, unspecified: Secondary | ICD-10-CM

## 2020-08-21 DIAGNOSIS — I10 Essential (primary) hypertension: Secondary | ICD-10-CM

## 2020-08-22 ENCOUNTER — Telehealth: Payer: Self-pay

## 2020-08-22 LAB — CBC WITH DIFFERENTIAL/PLATELET
Basophils Absolute: 0 10*3/uL (ref 0.0–0.2)
Basos: 1 %
EOS (ABSOLUTE): 0.2 10*3/uL (ref 0.0–0.4)
Eos: 3 %
Hematocrit: 35.6 % (ref 34.0–46.6)
Hemoglobin: 11.9 g/dL (ref 11.1–15.9)
Immature Grans (Abs): 0 10*3/uL (ref 0.0–0.1)
Immature Granulocytes: 0 %
Lymphocytes Absolute: 2 10*3/uL (ref 0.7–3.1)
Lymphs: 30 %
MCH: 29.2 pg (ref 26.6–33.0)
MCHC: 33.4 g/dL (ref 31.5–35.7)
MCV: 88 fL (ref 79–97)
Monocytes Absolute: 0.7 10*3/uL (ref 0.1–0.9)
Monocytes: 11 %
Neutrophils Absolute: 3.6 10*3/uL (ref 1.4–7.0)
Neutrophils: 55 %
Platelets: 354 10*3/uL (ref 150–450)
RBC: 4.07 x10E6/uL (ref 3.77–5.28)
RDW: 12.9 % (ref 11.7–15.4)
WBC: 6.5 10*3/uL (ref 3.4–10.8)

## 2020-08-22 LAB — HEMOGLOBIN A1C
Est. average glucose Bld gHb Est-mCnc: 146 mg/dL
Hgb A1c MFr Bld: 6.7 % — ABNORMAL HIGH (ref 4.8–5.6)

## 2020-08-22 NOTE — Telephone Encounter (Signed)
Patient advised of lab results by mychart and has read the providers comments.

## 2020-08-22 NOTE — Telephone Encounter (Signed)
-----   Message from Jerrol Banana., MD sent at 08/22/2020 10:10 AM EDT ----- Labs stable

## 2020-09-08 ENCOUNTER — Other Ambulatory Visit: Payer: Self-pay | Admitting: Family Medicine

## 2020-09-08 DIAGNOSIS — E119 Type 2 diabetes mellitus without complications: Secondary | ICD-10-CM

## 2020-09-08 DIAGNOSIS — I10 Essential (primary) hypertension: Secondary | ICD-10-CM

## 2020-09-09 NOTE — Telephone Encounter (Signed)
Requested medications are due for refill today?  Yes  Requested medications are on active medication list?  Yes  Last Refill:  06/20/2020  # 90 with no refills   Future visit scheduled?  Yes in 5 months.    Notes to Clinic:  Medication failed Rx refill protocol due to no labs within the past 180 days.  Last labs were performed on 02/21/2020.

## 2020-09-09 NOTE — Telephone Encounter (Signed)
Requested Prescriptions  Pending Prescriptions Disp Refills  . glipiZIDE (GLUCOTROL XL) 2.5 MG 24 hr tablet [Pharmacy Med Name: glipiZIDE ER 2.5 MG Oral Tablet Extended Release 24 Hour] 90 tablet 0    Sig: Take 1 tablet by mouth once daily     Endocrinology:  Diabetes - Sulfonylureas Passed - 09/08/2020  9:29 PM      Passed - HBA1C is between 0 and 7.9 and within 180 days    Hgb A1c MFr Bld  Date Value Ref Range Status  08/21/2020 6.7 (H) 4.8 - 5.6 % Final    Comment:             Prediabetes: 5.7 - 6.4          Diabetes: >6.4          Glycemic control for adults with diabetes: <7.0          Passed - Valid encounter within last 6 months    Recent Outpatient Visits          2 weeks ago Type 2 diabetes mellitus without complication, without long-term current use of insulin [facility])   [facility] Jerrol Banana., MD   6 months ago Annual physical exam   [facility] Jerrol Banana., MD   11 months ago Type 2 diabetes mellitus without complication, without long-term current use of insulin [facility])   [facility] Jerrol Banana., MD   1 year ago Essential hypertension   [facility] Jerrol Banana., MD   1 year ago Encounter for annual physical examination excluding gynecological examination in a patient older than 17 years   [facility] Jerrol Banana., MD      Future Appointments            In 5 months Jerrol Banana., MD [facility], [place]

## 2020-09-19 ENCOUNTER — Other Ambulatory Visit: Payer: Self-pay | Admitting: Family Medicine

## 2020-09-19 DIAGNOSIS — E7800 Pure hypercholesterolemia, unspecified: Secondary | ICD-10-CM

## 2020-09-23 ENCOUNTER — Other Ambulatory Visit: Payer: Self-pay | Admitting: Family Medicine

## 2020-09-23 DIAGNOSIS — E7800 Pure hypercholesterolemia, unspecified: Secondary | ICD-10-CM

## 2020-09-23 NOTE — Telephone Encounter (Signed)
Refill order 09/19/2020 did not process in pharmacy. Resent

## 2020-11-08 ENCOUNTER — Other Ambulatory Visit: Payer: Self-pay | Admitting: Family Medicine

## 2020-11-08 DIAGNOSIS — E119 Type 2 diabetes mellitus without complications: Secondary | ICD-10-CM

## 2020-11-08 NOTE — Telephone Encounter (Signed)
Requested Prescriptions  Pending Prescriptions Disp Refills  . metFORMIN (GLUCOPHAGE) 1000 MG tablet [Pharmacy Med Name: METFORMIN HCL 1,000 MG TABLET] 180 tablet 1    Sig: TAKE 1 TABLET (1,000 MG TOTAL) BY MOUTH 2 (TWO) TIMES DAILY WITH A MEAL.     Endocrinology:  Diabetes - Biguanides Passed - 11/08/2020 12:03 PM      Passed - Cr in normal range and within 360 days    Creat  Date Value Ref Range Status  08/31/2017 0.94 (H) 0.60 - 0.93 mg/dL Final    Comment:    For patients >4 years of age, the reference limit for Creatinine is approximately 13% higher for people identified as African-American. .    Creatinine, Ser  Date Value Ref Range Status  02/21/2020 0.88 0.57 - 1.00 mg/dL Final         Passed - HBA1C is between 0 and 7.9 and within 180 days    Hgb A1c MFr Bld  Date Value Ref Range Status  08/21/2020 6.7 (H) 4.8 - 5.6 % Final    Comment:             Prediabetes: 5.7 - 6.4          Diabetes: >6.4          Glycemic control for adults with diabetes: <7.0          Passed - eGFR in normal range and within 360 days    GFR calc Af Amer  Date Value Ref Range Status  02/21/2020 73 >59 mL/min/1.73 Final   GFR calc non Af Amer  Date Value Ref Range Status  02/21/2020 63 >59 mL/min/1.73 Final         Passed - Valid encounter within last 6 months    Recent Outpatient Visits          2 months ago Type 2 diabetes mellitus without complication, without long-term current use of insulin [facility])   [facility] Jerrol Banana., MD   8 months ago Annual physical exam   [facility] Jerrol Banana., MD   1 year ago Type 2 diabetes mellitus without complication, without long-term current use of insulin [facility])   [facility] Jerrol Banana., MD   1 year ago Essential hypertension   [facility] Jerrol Banana., MD   1 year ago Encounter for annual physical examination excluding gynecological  examination in a patient older than 17 years   [facility] Jerrol Banana., MD      Future Appointments            In 3 months Jerrol Banana., MD [facility]

## 2020-11-09 ENCOUNTER — Other Ambulatory Visit: Payer: Self-pay | Admitting: Family Medicine

## 2020-11-09 DIAGNOSIS — I10 Essential (primary) hypertension: Secondary | ICD-10-CM

## 2020-11-09 NOTE — Telephone Encounter (Signed)
Requested Prescriptions  Pending Prescriptions Disp Refills  . hydrochlorothiazide (MICROZIDE) 12.5 MG capsule [Pharmacy Med Name: HYDROCHLOROTHIAZIDE 12.5 MG CP] 90 capsule 1    Sig: TAKE 1 CAPSULE BY MOUTH EVERY DAY     Cardiovascular: Diuretics - Thiazide Failed - 11/09/2020  5:22 PM      Failed - Last BP in normal range    BP Readings from Last 1 Encounters:  08/21/20 (!) 158/83         Passed - Ca in normal range and within 360 days    Calcium  Date Value Ref Range Status  02/21/2020 9.4 8.7 - 10.3 mg/dL Final         Passed - Cr in normal range and within 360 days    Creat  Date Value Ref Range Status  08/31/2017 0.94 (H) 0.60 - 0.93 mg/dL Final    Comment:    For patients >21 years of age, the reference limit for Creatinine is approximately 13% higher for people identified as African-American. .    Creatinine, Ser  Date Value Ref Range Status  02/21/2020 0.88 0.57 - 1.00 mg/dL Final         Passed - K in normal range and within 360 days    Potassium  Date Value Ref Range Status  02/21/2020 4.4 3.5 - 5.2 mmol/L Final         Passed - Na in normal range and within 360 days    Sodium  Date Value Ref Range Status  02/21/2020 140 134 - 144 mmol/L Final         Passed - Valid encounter within last 6 months    Recent Outpatient Visits          2 months ago Type 2 diabetes mellitus without complication, without long-term current use of insulin [facility])   [facility] Jerrol Banana., MD   8 months ago Annual physical exam   [facility] Jerrol Banana., MD   1 year ago Type 2 diabetes mellitus without complication, without long-term current use of insulin [facility])   [facility] Jerrol Banana., MD   1 year ago Essential hypertension   [facility] Jerrol Banana., MD   1 year ago Encounter for annual physical examination excluding gynecological examination in a patient older than 17  years   [facility] Jerrol Banana., MD      Future Appointments            In 3 months Jerrol Banana., MD [facility], [place]

## 2020-12-01 ENCOUNTER — Other Ambulatory Visit: Payer: Self-pay | Admitting: Family Medicine

## 2020-12-01 DIAGNOSIS — I10 Essential (primary) hypertension: Secondary | ICD-10-CM

## 2020-12-01 NOTE — Telephone Encounter (Signed)
Requested Prescriptions  Pending Prescriptions Disp Refills   quinapril (ACCUPRIL) 40 MG tablet [Pharmacy Med Name: QUINAPRIL 40 MG TABLET] 90 tablet 0    Sig: TAKE 1 TABLET BY MOUTH EVERY DAY     Cardiovascular:  ACE Inhibitors Failed - 12/01/2020 10:00 AM      Failed - Cr in normal range and within 180 days    Creat  Date Value Ref Range Status  08/31/2017 0.94 (H) 0.60 - 0.93 mg/dL Final    Comment:    For patients >79 years of age, the reference limit for Creatinine is approximately 13% higher for people identified as African-American. .    Creatinine, Ser  Date Value Ref Range Status  02/21/2020 0.88 0.57 - 1.00 mg/dL Final         Failed - K in normal range and within 180 days    Potassium  Date Value Ref Range Status  02/21/2020 4.4 3.5 - 5.2 mmol/L Final         Failed - Last BP in normal range    BP Readings from Last 1 Encounters:  08/21/20 (!) 158/83         Passed - Patient is not pregnant      Passed - Valid encounter within last 6 months    Recent Outpatient Visits          3 months ago Type 2 diabetes mellitus without complication, without long-term current use of insulin ([place])   [facility] Jerrol Banana., MD   9 months ago Annual physical exam   [facility] Jerrol Banana., MD   1 year ago Type 2 diabetes mellitus without complication, without long-term current use of insulin [facility])   [facility] Jerrol Banana., MD   1 year ago Essential hypertension   [facility] Jerrol Banana., MD   1 year ago Encounter for annual physical examination excluding gynecological examination in a patient older than 17 years   [facility] Jerrol Banana., MD      Future Appointments            In 2 months Jerrol Banana., MD [facility], [place]

## 2021-02-03 ENCOUNTER — Other Ambulatory Visit: Payer: Self-pay | Admitting: Family Medicine

## 2021-02-03 DIAGNOSIS — Z1231 Encounter for screening mammogram for malignant neoplasm of breast: Secondary | ICD-10-CM

## 2021-02-18 NOTE — Progress Notes (Signed)
Established patient visit   Patient: Jennifer Mcgee   DOB: 1941-09-23   80 y.o. Female  MRN: [id number] Visit Date: 02/19/2021  Today's healthcare provider: Wilhemena Durie, MD   Chief Complaint  Patient presents with  . Diabetes  . Hypertension  . Hyperlipidemia   Subjective    HPI  Patient is doing well but is upset as her daughter has breast cancer and this is a stressor. Diabetes Mellitus Type II, follow-up  Lab Results  Component Value Date   HGBA1C 6.8 (A) 02/19/2021   HGBA1C 6.7 (H) 08/21/2020   HGBA1C 6.7 (H) 02/21/2020   Last seen for diabetes 6 months ago.  Management since then includes continuing the same treatment. She reports good compliance with treatment. She is not having side effects.   Home blood sugar records: fasting range: 90s-100s  Episodes of hypoglycemia? No    Current insulin regiment: none Most Recent Eye Exam: up to date 06/2020  Hypertension, follow-up  BP Readings from Last 3 Encounters:  02/19/21 138/82  08/21/20 (!) 158/83  02/19/20 (!) 142/80   Wt Readings from Last 3 Encounters:  08/21/20 210 lb 9.6 oz (95.5 kg)  02/19/20 210 lb 9.6 oz (95.5 kg)  10/01/19 213 lb (96.6 kg)     She was last seen for hypertension 6 months ago.  BP at that visit was 158/83. Management since that visit includes no medication changes. She reports good compliance with treatment. She is not having side effects.  She is not exercising. She is adherent to low salt diet.   Outside blood pressures are checked occasionally.  She does not smoke.  Use of agents associated with hypertension: none.    Lipid/Cholesterol, follow-up  Last Lipid Panel: Lab Results  Component Value Date   CHOL 168 02/21/2020   LDLCALC 85 02/21/2020   HDL 61 02/21/2020   TRIG 123 02/21/2020    She was last seen for this 6 months ago.  Management since that visit includes on Lipitor.  She reports good compliance with treatment. She is not having side  effects.   Symptoms: No appetite changes No foot ulcerations  No chest pain No chest pressure/discomfort  No dyspnea No orthopnea  No fatigue No lower extremity edema  No palpitations No paroxysmal nocturnal dyspnea  No nausea No numbness or tingling of extremity  No polydipsia No polyuria  No speech difficulty No syncope   She is following a Regular diet. Current exercise: no regular exercise  Last metabolic panel Lab Results  Component Value Date   GLUCOSE 133 (H) 02/21/2020   NA 140 02/21/2020   K 4.4 02/21/2020   BUN 19 02/21/2020   CREATININE 0.88 02/21/2020   GFRNONAA 63 02/21/2020   GFRAA 73 02/21/2020   CALCIUM 9.4 02/21/2020   AST 23 02/21/2020   ALT 21 02/21/2020   The 10-year ASCVD risk score Mikey Bussing DC Jr., et al., 2013) is: 57.9%        Medications: Outpatient Medications Prior to Visit  Medication Sig  . Accu-Chek FastClix Lancets MISC USE TO CHECK FASTING BLOOD SUGARS ONCE DAILY.  Marland Kitchen ACCU-CHEK SMARTVIEW test strip USE WITH METER TO CHECK GLUCOSE DAILY  . acetaminophen (TYLENOL) 325 MG tablet Take 650 mg by mouth every 6 (six) hours as needed.  Marland Kitchen atorvastatin (LIPITOR) 20 MG tablet TAKE 1 TABLET (20 MG TOTAL) BY MOUTH AT BEDTIME.  Marland Kitchen Cholecalciferol (VITAMIN D3) 2000 UNITS TABS Take 2,000 Units by mouth daily.   Marland Kitchen  docusate sodium (COLACE) 100 MG capsule Take 100 mg by mouth daily.  . ferrous sulfate 325 (65 FE) MG tablet Take 325 mg by mouth daily with breakfast.   . glipiZIDE (GLUCOTROL XL) 2.5 MG 24 hr tablet Take 1 tablet by mouth once daily  . hydrochlorothiazide (MICROZIDE) 12.5 MG capsule TAKE 1 CAPSULE BY MOUTH EVERY DAY  . magnesium gluconate (MAGONATE) 500 MG tablet Take 500 mg by mouth daily.   . metFORMIN (GLUCOPHAGE) 1000 MG tablet TAKE 1 TABLET (1,000 MG TOTAL) BY MOUTH 2 (TWO) TIMES DAILY WITH A MEAL.  . Multiple Vitamin (MULTIVITAMIN) tablet Take 1 tablet by mouth daily.  . Multiple Vitamins-Minerals (PRESERVISION AREDS 2 PO) Take 1 capsule  by mouth 2 (two) times daily.  . quinapril (ACCUPRIL) 40 MG tablet TAKE 1 TABLET BY MOUTH EVERY DAY  . vitamin B-12 (CYANOCOBALAMIN) 1000 MCG tablet Take 1,000 mcg by mouth daily.   No facility-administered medications prior to visit.    Review of Systems  Constitutional: Negative for activity change.  Respiratory: Negative for cough and shortness of breath.   Cardiovascular: Negative for chest pain, palpitations and leg swelling.  Endocrine: Negative for cold intolerance, heat intolerance, polydipsia, polyphagia and polyuria.  Musculoskeletal: Negative for arthralgias, back pain and joint swelling.  Skin: Negative for color change, pallor, rash and wound.  Neurological: Negative for dizziness, light-headedness and headaches.        Objective    BP 138/82   Pulse 87   Temp 98.2 F (36.8 C)   Resp 16  BP Readings from Last 3 Encounters:  02/19/21 138/82  08/21/20 (!) 158/83  02/19/20 (!) 142/80   Wt Readings from Last 3 Encounters:  08/21/20 210 lb 9.6 oz (95.5 kg)  02/19/20 210 lb 9.6 oz (95.5 kg)  10/01/19 213 lb (96.6 kg)       Physical Exam    Results for orders placed or performed in visit on 02/19/21  POCT glycosylated hemoglobin (Hb A1C)  Result Value Ref Range   Hemoglobin A1C 6.8 (A) 4.0 - 5.6 %   HbA1c POC (<> result, manual entry)     HbA1c, POC (prediabetic range)     HbA1c, POC (controlled diabetic range)      Assessment & Plan     1. Type 2 diabetes mellitus without complication, without long-term current use of insulin (HCC) A1c A1c is 6.8 good control. - POCT glycosylated hemoglobin (Hb A1C)  2. Hypercholesteremia On atorvastatin 20 - Lipid panel  3. Essential hypertension controlled on quinapril 40 - CBC with Differential/Platelet - Comprehensive metabolic panel - TSH   No follow-ups on file.      I, Wilhemena Durie, MD, have reviewed all documentation for this visit. The documentation on 02/22/21 for the exam, diagnosis,  procedures, and orders are all accurate and complete.    Cari Burgo Cranford Mon, MD  [facility] [phone] (phone) [phone] (fax)  [place]

## 2021-02-19 ENCOUNTER — Other Ambulatory Visit: Payer: Self-pay

## 2021-02-19 ENCOUNTER — Ambulatory Visit (INDEPENDENT_AMBULATORY_CARE_PROVIDER_SITE_OTHER): Payer: Medicare PPO | Admitting: Family Medicine

## 2021-02-19 ENCOUNTER — Encounter: Payer: Self-pay | Admitting: Family Medicine

## 2021-02-19 VITALS — BP 138/82 | HR 87 | Temp 98.2°F | Resp 16

## 2021-02-19 DIAGNOSIS — E7800 Pure hypercholesterolemia, unspecified: Secondary | ICD-10-CM | POA: Diagnosis not present

## 2021-02-19 DIAGNOSIS — I10 Essential (primary) hypertension: Secondary | ICD-10-CM

## 2021-02-19 DIAGNOSIS — E119 Type 2 diabetes mellitus without complications: Secondary | ICD-10-CM

## 2021-02-19 LAB — POCT GLYCOSYLATED HEMOGLOBIN (HGB A1C): Hemoglobin A1C: 6.8 % — AB (ref 4.0–5.6)

## 2021-02-20 LAB — CBC WITH DIFFERENTIAL/PLATELET
Basophils Absolute: 0.1 x10E3/uL (ref 0.0–0.2)
Basos: 1 %
EOS (ABSOLUTE): 0.2 x10E3/uL (ref 0.0–0.4)
Eos: 3 %
Hematocrit: 37.3 % (ref 34.0–46.6)
Hemoglobin: 12.3 g/dL (ref 11.1–15.9)
Immature Grans (Abs): 0 x10E3/uL (ref 0.0–0.1)
Immature Granulocytes: 0 %
Lymphocytes Absolute: 1.8 x10E3/uL (ref 0.7–3.1)
Lymphs: 25 %
MCH: 28.9 pg (ref 26.6–33.0)
MCHC: 33 g/dL (ref 31.5–35.7)
MCV: 88 fL (ref 79–97)
Monocytes Absolute: 0.8 x10E3/uL (ref 0.1–0.9)
Monocytes: 11 %
Neutrophils Absolute: 4.3 x10E3/uL (ref 1.4–7.0)
Neutrophils: 60 %
Platelets: 377 x10E3/uL (ref 150–450)
RBC: 4.25 x10E6/uL (ref 3.77–5.28)
RDW: 13.2 % (ref 11.7–15.4)
WBC: 7.2 x10E3/uL (ref 3.4–10.8)

## 2021-02-20 LAB — LIPID PANEL
Chol/HDL Ratio: 2.9 ratio (ref 0.0–4.4)
Cholesterol, Total: 179 mg/dL (ref 100–199)
HDL: 62 mg/dL (ref >39)
LDL Chol Calc (NIH): 97 mg/dL (ref 0–99)
Triglycerides: 111 mg/dL (ref 0–149)
VLDL Cholesterol Cal: 20 mg/dL (ref 5–40)

## 2021-02-20 LAB — COMPREHENSIVE METABOLIC PANEL WITH GFR
ALT: 17 IU/L (ref 0–32)
AST: 21 IU/L (ref 0–40)
Albumin/Globulin Ratio: 1.8 (ref 1.2–2.2)
Albumin: 4.5 g/dL (ref 3.7–4.7)
Alkaline Phosphatase: 88 IU/L (ref 44–121)
BUN/Creatinine Ratio: 19 (ref 12–28)
BUN: 16 mg/dL (ref 8–27)
Bilirubin Total: 0.8 mg/dL (ref 0.0–1.2)
CO2: 25 mmol/L (ref 20–29)
Calcium: 10.2 mg/dL (ref 8.7–10.3)
Chloride: 101 mmol/L (ref 96–106)
Creatinine, Ser: 0.86 mg/dL (ref 0.57–1.00)
Globulin, Total: 2.5 g/dL (ref 1.5–4.5)
Glucose: 143 mg/dL — ABNORMAL HIGH (ref 65–99)
Potassium: 5.1 mmol/L (ref 3.5–5.2)
Sodium: 146 mmol/L — ABNORMAL HIGH (ref 134–144)
Total Protein: 7 g/dL (ref 6.0–8.5)
eGFR: 69 mL/min/1.73

## 2021-02-20 LAB — TSH: TSH: 2.33 u[IU]/mL (ref 0.450–4.500)

## 2021-02-24 ENCOUNTER — Other Ambulatory Visit: Payer: Self-pay | Admitting: Family Medicine

## 2021-02-24 DIAGNOSIS — I10 Essential (primary) hypertension: Secondary | ICD-10-CM

## 2021-03-02 ENCOUNTER — Other Ambulatory Visit: Payer: Self-pay

## 2021-03-02 ENCOUNTER — Ambulatory Visit
Admission: RE | Admit: 2021-03-02 | Discharge: 2021-03-02 | Disposition: A | Discharge status: Home / Self Care | Payer: Medicare PPO | Source: Ambulatory Visit | Attending: Family Medicine | Admitting: Family Medicine

## 2021-03-02 DIAGNOSIS — Z1231 Encounter for screening mammogram for malignant neoplasm of breast: Secondary | ICD-10-CM | POA: Diagnosis not present

## 2021-03-04 ENCOUNTER — Other Ambulatory Visit: Payer: Self-pay | Admitting: Family Medicine

## 2021-03-04 DIAGNOSIS — E7800 Pure hypercholesterolemia, unspecified: Secondary | ICD-10-CM

## 2021-03-09 ENCOUNTER — Other Ambulatory Visit: Payer: Self-pay | Admitting: Family Medicine

## 2021-03-09 DIAGNOSIS — E119 Type 2 diabetes mellitus without complications: Secondary | ICD-10-CM

## 2021-03-09 NOTE — Telephone Encounter (Signed)
Requested Prescriptions  Pending Prescriptions Disp Refills  . glipiZIDE (GLUCOTROL XL) 2.5 MG 24 hr tablet [Pharmacy Med Name: glipiZIDE ER 2.5 MG Oral Tablet Extended Release 24 Hour] 90 tablet 1    Sig: Take 1 tablet by mouth once daily     Endocrinology:  Diabetes - Sulfonylureas Passed - 03/09/2021  1:51 PM      Passed - HBA1C is between 0 and 7.9 and within 180 days    Hemoglobin A1C  Date Value Ref Range Status  02/19/2021 6.8 (A) 4.0 - 5.6 % Final   Hgb A1c MFr Bld  Date Value Ref Range Status  08/21/2020 6.7 (H) 4.8 - 5.6 % Final    Comment:             Prediabetes: 5.7 - 6.4          Diabetes: >6.4          Glycemic control for adults with diabetes: <7.0          Passed - Valid encounter within last 6 months    Recent Outpatient Visits          2 weeks ago Type 2 diabetes mellitus without complication, without long-term current use of insulin [facility])   [facility] Jerrol Banana., MD   6 months ago Type 2 diabetes mellitus without complication, without long-term current use of insulin [facility])   [facility] Jerrol Banana., MD   1 year ago Annual physical exam   [facility] Jerrol Ban[facility] MD   1 year ago Type 2 diabetes mellitus without complication, without long-term current use of insulin [facility])   [facility] Jerrol Banana., MD   1 year ago Essential hypertension   [facility] Jerrol Banana., MD      Future Appointments            In 5 months Jerrol Banana., MD [facility], [place]

## 2021-03-18 ENCOUNTER — Other Ambulatory Visit: Payer: Self-pay

## 2021-03-18 ENCOUNTER — Encounter: Payer: Self-pay | Admitting: Dermatology

## 2021-03-18 ENCOUNTER — Ambulatory Visit (INDEPENDENT_AMBULATORY_CARE_PROVIDER_SITE_OTHER): Payer: Medicare PPO | Admitting: Dermatology

## 2021-03-18 DIAGNOSIS — L570 Actinic keratosis: Secondary | ICD-10-CM

## 2021-03-18 DIAGNOSIS — L820 Inflamed seborrheic keratosis: Secondary | ICD-10-CM

## 2021-03-18 DIAGNOSIS — L578 Other skin changes due to chronic exposure to nonionizing radiation: Secondary | ICD-10-CM

## 2021-03-18 DIAGNOSIS — D1800 Hemangioma unspecified site: Secondary | ICD-10-CM

## 2021-03-18 DIAGNOSIS — L719 Rosacea, unspecified: Secondary | ICD-10-CM | POA: Diagnosis not present

## 2021-03-18 DIAGNOSIS — L814 Other melanin hyperpigmentation: Secondary | ICD-10-CM

## 2021-03-18 DIAGNOSIS — L718 Other rosacea: Secondary | ICD-10-CM

## 2021-03-18 DIAGNOSIS — Z1283 Encounter for screening for malignant neoplasm of skin: Secondary | ICD-10-CM | POA: Diagnosis not present

## 2021-03-18 DIAGNOSIS — D229 Melanocytic nevi, unspecified: Secondary | ICD-10-CM

## 2021-03-18 DIAGNOSIS — Z85828 Personal history of other malignant neoplasm of skin: Secondary | ICD-10-CM

## 2021-03-18 DIAGNOSIS — L821 Other seborrheic keratosis: Secondary | ICD-10-CM

## 2021-03-18 DIAGNOSIS — L84 Corns and callosities: Secondary | ICD-10-CM

## 2021-03-18 NOTE — Patient Instructions (Signed)

## 2021-03-18 NOTE — Progress Notes (Signed)
Follow-Up Visit   Subjective  Jennifer Mcgee is a 80 y.o. female who presents for the following: Annual Exam (Hx SCC ). The patient presents for Total-Body Skin Exam (TBSE) for skin cancer screening and mole check.  The following portions of the chart were reviewed this encounter and updated as appropriate:   Tobacco  Allergies  Meds  Problems  Med Hx  Surg Hx  Fam Hx     Review of Systems:  No other skin or systemic complaints except as noted in HPI or Assessment and Plan.  Objective  Well appearing patient in no apparent distress; mood and affect are within normal limits.  A full examination was performed including scalp, head, eyes, ears, nose, lips, neck, chest, axillae, abdomen, back, buttocks, bilateral upper extremities, bilateral lower extremities, hands, feet, fingers, toes, fingernails, and toenails. All findings within normal limits unless otherwise noted below.  Objective  Face: Erythema of the face, irritation of the eyes.  Objective  L zygoma x 1: Erythematous thin papules/macules with gritty scale.   Objective  R zygoma x 1: Erythematous keratotic or waxy stuck-on papule or plaque.   Objective  B/L foot: Thickened skin.  Assessment & Plan  Rosacea Face With ocular rosacea -currently asymptomatic. Discussed BBL laser tx and oral Doxycycline and topical eyedrops.  Patient to f/u with ophthalmologist for ocular involvement treatment discussion if desired.  Rosacea is a chronic progressive skin condition usually affecting the face of adults, causing redness and/or acne bumps. It is treatable but not curable. It sometimes affects the eyes (ocular rosacea) as well. It may respond to topical and/or systemic medication and can flare with stress, sun exposure, alcohol, exercise and some foods.  Daily application of broad spectrum spf 30+ sunscreen to face is recommended to reduce flares.  AK (actinic keratosis) L zygoma x 1 Destruction of lesion - L zygoma  x 1 Complexity: simple   Destruction method: cryotherapy   Informed consent: discussed and consent obtained   Timeout:  patient name, date of birth, surgical site, and procedure verified Lesion destroyed using liquid nitrogen: Yes   Region frozen until ice ball extended beyond lesion: Yes   Outcome: patient tolerated procedure well with no complications   Post-procedure details: wound care instructions given    Inflamed seborrheic keratosis R zygoma x 1 Destruction of lesion - R zygoma x 1 Complexity: simple   Destruction method: cryotherapy   Informed consent: discussed and consent obtained   Timeout:  patient name, date of birth, surgical site, and procedure verified Lesion destroyed using liquid nitrogen: Yes   Region frozen until ice ball extended beyond lesion: Yes   Outcome: patient tolerated procedure well with no complications   Post-procedure details: wound care instructions given    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   Benign-appearing.  Observation.  Call clinic for new or changing lesions.  Recommend daily use of broad spectrum spf 30+ sunscreen to sun-exposed areas.  Follow up with podiatrist if desired further treatment.  Lentigines - Scattered tan macules - Due to sun exposure - Benign-appering, observe - Recommend daily broad spectrum sunscreen SPF 30+ to sun-exposed areas, reapply every 2 hours as needed. - Call for any changes  Seborrheic Keratoses - Stuck-on, waxy, tan-brown papules and/or plaques  - Benign-appearing - Discussed benign etiology and prognosis. - Observe - Call for any changes  Melanocytic Nevi - Tan-brown and/or pink-flesh-colored symmetric macules and papules - Benign appearing on exam today - Observation - Call  clinic for new or changing moles - Recommend daily use of broad spectrum spf 30+ sunscreen to sun-exposed areas.   Hemangiomas - Red papules - Discussed benign nature - Observe - Call for any  changes  Actinic Damage - Chronic condition, secondary to cumulative UV/sun exposure - diffuse scaly erythematous macules with underlying dyspigmentation - Recommend daily broad spectrum sunscreen SPF 30+ to sun-exposed areas, reapply every 2 hours as needed.  - Staying in the shade or wearing long sleeves, sun glasses (UVA+UVB protection) and wide brim hats (4-inch brim around the entire circumference of the hat) are also recommended for sun protection.  - Call for new or changing lesions.  History of Squamous Cell Carcinoma of the Skin - No evidence of recurrence today - No lymphadenopathy - Recommend regular full body skin exams - Recommend daily broad spectrum sunscreen SPF 30+ to sun-exposed areas, reapply every 2 hours as needed.  - Call if any new or changing lesions are noted between office visits  Skin cancer screening performed today.  Return in about 1 year (around 03/18/2022) for TBSE.  Luther Redo, CMA, am acting as scribe for Sarina Ser, MD .  Documentation: I have reviewed the above documentation for accuracy and completeness, and I agree with the above.  Sarina Ser, MD

## 2021-03-21 ENCOUNTER — Other Ambulatory Visit: Payer: Self-pay | Admitting: Family Medicine

## 2021-03-21 DIAGNOSIS — E119 Type 2 diabetes mellitus without complications: Secondary | ICD-10-CM

## 2021-04-24 ENCOUNTER — Other Ambulatory Visit: Payer: Self-pay | Admitting: Family Medicine

## 2021-04-24 DIAGNOSIS — I10 Essential (primary) hypertension: Secondary | ICD-10-CM

## 2021-04-24 DIAGNOSIS — E119 Type 2 diabetes mellitus without complications: Secondary | ICD-10-CM

## 2021-04-24 NOTE — Telephone Encounter (Signed)
Requested Prescriptions  Pending Prescriptions Disp Refills  . metFORMIN (GLUCOPHAGE) 1000 MG tablet [Pharmacy Med Name: METFORMIN HCL 1,000 MG TABLET] 180 tablet 1    Sig: TAKE 1 TABLET (1,000 MG TOTAL) BY MOUTH 2 (TWO) TIMES DAILY WITH A MEAL.     Endocrinology:  Diabetes - Biguanides Passed - 04/24/2021  9:59 PM      Passed - Cr in normal range and within 360 days    Creat  Date Value Ref Range Status  08/31/2017 0.94 (H) 0.60 - 0.93 mg/dL Final    Comment:    For patients >21 years of age, the reference limit for Creatinine is approximately 13% higher for people identified as African-American. .    Creatinine, Ser  Date Value Ref Range Status  02/19/2021 0.86 0.57 - 1.00 mg/dL Final         Passed - HBA1C is between 0 and 7.9 and within 180 days    Hemoglobin A1C  Date Value Ref Range Status  02/19/2021 6.8 (A) 4.0 - 5.6 % Final   Hgb A1c MFr Bld  Date Value Ref Range Status  08/21/2020 6.7 (H) 4.8 - 5.6 % Final    Comment:             Prediabetes: 5.7 - 6.4          Diabetes: >6.4          Glycemic control for adults with diabetes: <7.0          Passed - eGFR in normal range and within 360 days    GFR calc Af Amer  Date Value Ref Range Status  02/21/2020 73 >59 mL/min/1.73 Final   GFR calc non Af Amer  Date Value Ref Range Status  02/21/2020 63 >59 mL/min/1.73 Final   eGFR  Date Value Ref Range Status  02/19/2021 69 >59 mL/min/1.73 Final         Passed - Valid encounter within last 6 months    Recent Outpatient Visits          2 months ago Type 2 diabetes mellitus without complication, without long-term current use of insulin ([place])   [facility] Jerrol Banana., MD   8 months ago Type 2 diabetes mellitus without complication, without long-term current use of insulin [facility])   [facility] Jerrol Banana., MD   1 year ago Annual physical exam   [facility] Jerrol Banana., MD   1 year ago  Type 2 diabetes mellitus without complication, without long-term current use of insulin [facility])   [facility] Jerrol Banana., MD   1 year ago Essential hypertension   [facility] Jerrol Banana., MD      Future Appointments            In 4 months Jerrol Banana., MD [facility]           . hydrochlorothiazide (MICROZIDE) 12.5 MG capsule [Pharmacy Med Name: HYDROCHLOROTHIAZIDE 12.5 MG CP] 90 capsule 1    Sig: TAKE 1 CAPSULE BY MOUTH EVERY DAY     Cardiovascular: Diuretics - Thiazide Failed - 04/24/2021  9:59 PM      Failed - Na in normal range and within 360 days    Sodium  Date Value Ref Range Status  02/19/2021 146 (H) 134 - 144 mmol/L Final         Passed - Ca in normal range and within  360 days    Calcium  Date Value Ref Range Status  02/19/2021 10.2 8.7 - 10.3 mg/dL Final         Passed - Cr in normal range and within 360 days    Creat  Date Value Ref Range Status  08/31/2017 0.94 (H) 0.60 - 0.93 mg/dL Final    Comment:    For patients >47 years of age, the reference limit for Creatinine is approximately 13% higher for people identified as African-American. .    Creatinine, Ser  Date Value Ref Range Status  02/19/2021 0.86 0.57 - 1.00 mg/dL Final         Passed - K in normal range and within 360 days    Potassium  Date Value Ref Range Status  02/19/2021 5.1 3.5 - 5.2 mmol/L Final         Passed - Last BP in normal range    BP Readings from Last 1 Encounters:  02/19/21 138/82         Passed - Valid encounter within last 6 months    Recent Outpatient Visits          2 months ago Type 2 diabetes mellitus without complication, without long-term current use of insulin [facility])   [facility] Jerrol Banana., MD   8 months ago Type 2 diabetes mellitus without complication, without long-term current use of insulin [facility])   [facility] Jerrol Banana., MD   1 year ago Annual physical exam   [facility] Jerrol Banana., MD   1 year ago Type 2 diabetes mellitus without complication, without long-term current use of insulin [facility])   [facility] Jerrol Banana., MD   1 year ago Essential hypertension   [facility] Jerrol Banana., MD      Future Appointments            In 4 months Jerrol Banana., MD [facility], [place]

## 2021-06-02 DIAGNOSIS — U071 COVID-19: Secondary | ICD-10-CM | POA: Diagnosis not present

## 2021-06-02 DIAGNOSIS — Z20828 Contact with and (suspected) exposure to other viral communicable diseases: Secondary | ICD-10-CM | POA: Diagnosis not present

## 2021-06-03 ENCOUNTER — Ambulatory Visit: Payer: Self-pay | Admitting: *Deleted

## 2021-06-03 MED ORDER — MOLNUPIRAVIR EUA 200MG CAPSULE: 4.0000 | ORAL_CAPSULE | Freq: Two times a day (BID) | ORAL | 0 refills | Status: AC

## 2021-06-03 NOTE — Telephone Encounter (Signed)
Pt tested positive for covid yesterday, tested at [facility]. States went to event, multiple family members also positive. Reports dry cough, body aches, slight headache, nasal congestion. Temp 100.3 this AM. Cough is dry, UC gave Delsum, "Helps." Does reports "Little wheezing off and on, not often." O2 sat 96%, room air. Denies any SOB. Symptoms started Monday. Pt is interested in oral anti-virals and/or infusion if Dr. Rosanna Randy recommends. Home care advise given per protocol, self isolation guidelines reviewed. Reviewed symptoms that warrant an ED eval. Pt verbalizes understanding. Assured pt NT would route to practice for PCPs review and final disposition. Pt aware no provider at practice today.

## 2021-06-03 NOTE — Telephone Encounter (Signed)
  Date of symptom onset:06/01/21                            Date of Positive test: 06/02/21                     # of vaccines:2  Symptoms:  non productive cough, body aches, headache, nasal congestion, low grade fever 100.3    Treatment requested:  patient has no preferance     Oral           Infusion  Date of renal function:  Do not see renal function in past labs >5years   Lab Results  Component Value Date   NA 146 (H) 02/19/2021   K 5.1 02/19/2021   CREATININE 0.86 02/19/2021   GFRNONAA 63 02/21/2020   GFRAA 73 02/21/2020   GLUCOSE 143 (H) 02/19/2021    Medical risks Heart disease:                                Lung disease: Diabetes:   yes                                     Hypertension:yes Kidney disease:                              Liver disease: Hypercholesterolemia: yes                              Biologic medication:  Other: Immunocompromised due to:  Preferred pharmacy:    [facility] pharmacy preference:

## 2021-06-03 NOTE — Telephone Encounter (Signed)
Pt went to walk in clinic yesterday was dx with covid. Pt now has temp 100.3 , cough, slight headache, nasal congestion, bodyaches all over. Pt was told to take tylenol. Pt did not have a temp yesterday. Please advise. No openings at [facility]  Reason for Disposition . [1] HIGH RISK for severe COVID complications (e.g., weak immune system, age > 73 years, obesity with BMI > 25, pregnant, chronic lung disease or other chronic medical condition) AND [2] COVID symptoms (e.g., cough, fever)  (Exceptions: Already seen by PCP and no new or worsening symptoms.)  Answer Assessment - Initial Assessment Questions 1. COVID-19 DIAGNOSIS: "Who made your COVID-19 diagnosis?" "Was it confirmed by a positive lab test or self-test?" If not diagnosed by a doctor (or NP/PA), ask "Are there lots of cases (community spread) where you live?" Note: See public health department website, if unsure.     UC 2. COVID-19 EXPOSURE: "Was there any known exposure to COVID before the symptoms began?" CDC Definition of close contact: within 6 feet (2 meters) for a total of 15 minutes or more over a 24-hour period.      no 3. ONSET: "When did the COVID-19 symptoms start?"      Monday 4. WORST SYMPTOM: "What is your worst symptom?" (e.g., cough, fever, shortness of breath, muscle aches)     Body aches 5. COUGH: "Do you have a cough?" If Yes, ask: "How bad is the cough?"       Dry,Moderate 6. FEVER: "Do you have a fever?" If Yes, ask: "What is your temperature, how was it measured, and when did it start?"     100.3 7. RESPIRATORY STATUS: "Describe your breathing?" (e.g., shortness of breath, wheezing, unable to speak)      "Little wheezing on occasion. 8. BETTER-SAME-WORSE: "Are you getting better, staying the same or getting worse compared to yesterday?"  If getting worse, ask, "In what way?"     worse 9. HIGH RISK DISEASE: "Do you have any chronic medical problems?" (e.g., asthma, heart or lung disease, weak immune system, obesity,  etc.)     DM, Obesity 10. VACCINE: "Have you had the COVID-19 vaccine?" If Yes, ask: "Which one, how many shots, when did you get it?"       Pfizer 11. BOOSTER: "Have you received your COVID-19 booster?" If Yes, ask: "Which one and when did you get it?"       1 booster, Oct 2021  13. OTHER SYMPTOMS: "Do you have any other symptoms?"  (e.g., chills, fatigue, headache, loss of smell or taste, muscle pain, sore throat)       Body aches, slight headache, Nasal congestion 14. O2 SATURATION MONITOR:  "Do you use an oxygen saturation monitor (pulse oximeter) at home?" If Yes, ask "What is your reading (oxygen level) today?" "What is your usual oxygen saturation reading?" (e.g., 95%)       96%  Protocols used: Coronavirus (COVID-19) Diagnosed or Suspected-A-AH

## 2021-06-18 DIAGNOSIS — H43811 Vitreous degeneration, right eye: Secondary | ICD-10-CM | POA: Diagnosis not present

## 2021-06-18 DIAGNOSIS — H35342 Macular cyst, hole, or pseudohole, left eye: Secondary | ICD-10-CM | POA: Diagnosis not present

## 2021-06-18 DIAGNOSIS — H35371 Puckering of macula, right eye: Secondary | ICD-10-CM | POA: Diagnosis not present

## 2021-06-18 DIAGNOSIS — H353132 Nonexudative age-related macular degeneration, bilateral, intermediate dry stage: Secondary | ICD-10-CM | POA: Diagnosis not present

## 2021-06-18 DIAGNOSIS — E119 Type 2 diabetes mellitus without complications: Secondary | ICD-10-CM | POA: Diagnosis not present

## 2021-06-18 LAB — HM DIABETES EYE EXAM

## 2021-07-14 DIAGNOSIS — E119 Type 2 diabetes mellitus without complications: Secondary | ICD-10-CM | POA: Diagnosis not present

## 2021-07-14 DIAGNOSIS — Z96652 Presence of left artificial knee joint: Secondary | ICD-10-CM | POA: Diagnosis not present

## 2021-07-14 DIAGNOSIS — M1711 Unilateral primary osteoarthritis, right knee: Secondary | ICD-10-CM | POA: Diagnosis not present

## 2021-08-14 ENCOUNTER — Other Ambulatory Visit: Payer: Self-pay | Admitting: Family Medicine

## 2021-08-14 DIAGNOSIS — I10 Essential (primary) hypertension: Secondary | ICD-10-CM

## 2021-08-26 ENCOUNTER — Other Ambulatory Visit: Payer: Self-pay

## 2021-08-26 ENCOUNTER — Ambulatory Visit (INDEPENDENT_AMBULATORY_CARE_PROVIDER_SITE_OTHER): Payer: Medicare PPO | Admitting: Family Medicine

## 2021-08-26 ENCOUNTER — Encounter: Payer: Self-pay | Admitting: Family Medicine

## 2021-08-26 VITALS — BP 148/81 | HR 87 | Temp 98.5°F | Resp 18 | Ht 63.0 in | Wt 209.0 lb

## 2021-08-26 DIAGNOSIS — I10 Essential (primary) hypertension: Secondary | ICD-10-CM

## 2021-08-26 DIAGNOSIS — R252 Cramp and spasm: Secondary | ICD-10-CM | POA: Diagnosis not present

## 2021-08-26 DIAGNOSIS — Z0000 Encounter for general adult medical examination without abnormal findings: Secondary | ICD-10-CM

## 2021-08-26 DIAGNOSIS — E119 Type 2 diabetes mellitus without complications: Secondary | ICD-10-CM | POA: Diagnosis not present

## 2021-08-26 LAB — POCT GLYCOSYLATED HEMOGLOBIN (HGB A1C)
Est. average glucose Bld gHb Est-mCnc: 148
Hemoglobin A1C: 6.8 % — AB (ref 4.0–5.6)

## 2021-08-26 MED ORDER — ACCU-CHEK FASTCLIX LANCETS MISC: 3 refills | Status: DC

## 2021-08-26 NOTE — Progress Notes (Signed)
I,April Miller,acting as a scribe for Wilhemena Durie, MD.,have documented all relevant documentation on the behalf of Wilhemena Durie, MD,as directed by  Wilhemena Durie, MD while in the presence of Wilhemena Durie, MD.   Annual Wellness Visit     Patient: Jennifer Mcgee, Female    DOB: 08-06-1941, 80 y.o.   MRN: [id number] Visit Date: 08/26/2021  Today's Provider: Wilhemena Durie, MD   Chief Complaint  Patient presents with   Medicare Jennifer Mcgee is a 80 y.o. female who presents today for her Annual Wellness Visit. She reports consuming a general diet. The patient does not participate in regular exercise at present. She generally feels well. She reports sleeping fairly well. She does not have additional problems to discuss today.  Patient does have some some nocturnal leg cramps that has not been helped by magnesium.  That is her only complaint.  HPI    Medications: Outpatient Medications Prior to Visit  Medication Sig   ACCU-CHEK SMARTVIEW test strip USE WITH METER TO CHECK GLUCOSE DAILY   acetaminophen (TYLENOL) 325 MG tablet Take 650 mg by mouth every 6 (six) hours as needed.   atorvastatin (LIPITOR) 20 MG tablet TAKE 1 TABLET (20 MG TOTAL) BY MOUTH AT BEDTIME.   ferrous sulfate 325 (65 FE) MG tablet Take 325 mg by mouth daily with breakfast.    glipiZIDE (GLUCOTROL XL) 2.5 MG 24 hr tablet Take 1 tablet by mouth once daily   hydrochlorothiazide (MICROZIDE) 12.5 MG capsule TAKE 1 CAPSULE BY MOUTH EVERY DAY   magnesium gluconate (MAGONATE) 500 MG tablet Take 500 mg by mouth daily.    metFORMIN (GLUCOPHAGE) 1000 MG tablet TAKE 1 TABLET (1,000 MG TOTAL) BY MOUTH 2 (TWO) TIMES DAILY WITH A MEAL.   Multiple Vitamin (MULTIVITAMIN) tablet Take 1 tablet by mouth daily.   Multiple Vitamins-Minerals (PRESERVISION AREDS 2 PO) Take 1 capsule by mouth 2 (two) times daily.   quinapril (ACCUPRIL) 40 MG tablet TAKE 1 TABLET BY MOUTH EVERY  DAY   vitamin B-12 (CYANOCOBALAMIN) 1000 MCG tablet Take 1,000 mcg by mouth daily.   [DISCONTINUED] Accu-Chek FastClix Lancets MISC USE TO CHECK FASTING BLOOD SUGARS ONCE DAILY.   [DISCONTINUED] Cholecalciferol (VITAMIN D3) 2000 UNITS TABS Take 2,000 Units by mouth daily.  (Patient not taking: Reported on 08/26/2021)   [DISCONTINUED] docusate sodium (COLACE) 100 MG capsule Take 100 mg by mouth daily.   No facility-administered medications prior to visit.    Allergies  Allergen Reactions   Epinephrine Other (See Comments)    Feels jumpy Jerking movements all over the body.    Penicillins Itching and Other (See Comments)    Has patient had a PCN reaction causing immediate rash, facial/tongue/throat swelling, SOB or lightheadedness with hypotension: Yes Has patient had a PCN reaction causing severe rash involving mucus membranes or skin necrosis: No Has patient had a PCN reaction that required hospitalization: No Has patient had a PCN reaction occurring within the last 10 years: No If all of the above answers are "NO", then may proceed with Cephalosporin use.    Anner Crete In D5w] Anxiety    Patient Care Team: Jerrol Banana., MD as PCP - General (Family Medicine) Christene Lye, MD (General Surgery) Earnestine Leys, MD as Consulting Physician (Specialist) Ralene Bathe, MD as Consulting Physician (Dermatology)  Review of Systems  Musculoskeletal:  Positive for arthralgias and back pain.  All other systems  reviewed and are negative.       Objective    Vitals: BP (!) 148/81 (BP Location: Left Arm, Cuff Size: Large)   Pulse 87   Temp 98.5 F (36.9 C) (Oral)   Resp 18   Ht 5\' 3"  (1.6 m)   Wt 209 lb (94.8 kg)   SpO2 95%   BMI 37.02 kg/m  BP Readings from Last 3 Encounters:  08/26/21 (!) 148/81  02/19/21 138/82  08/21/20 (!) 158/83   Wt Readings from Last 3 Encounters:  08/26/21 209 lb (94.8 kg)  08/21/20 210 lb 9.6 oz (95.5 kg)  02/19/20  210 lb 9.6 oz (95.5 kg)      Physical Exam Vitals reviewed.  Constitutional:      Appearance: She is well-developed. She is obese.  HENT:     Head: Normocephalic and atraumatic.     Right Ear: Tympanic membrane and external ear normal.     Left Ear: Tympanic membrane and external ear normal.     Nose: Nose normal.     Mouth/Throat:     Mouth: Mucous membranes are moist.     Pharynx: Oropharynx is clear.  Eyes:     General: No scleral icterus.    Extraocular Movements: Extraocular movements intact.     Conjunctiva/sclera: Conjunctivae normal.     Pupils: Pupils are equal, round, and reactive to light.  Neck:     Thyroid: No thyromegaly.  Cardiovascular:     Rate and Rhythm: Normal rate and regular rhythm.     Heart sounds: Normal heart sounds.  Pulmonary:     Effort: Pulmonary effort is normal.     Breath sounds: Normal breath sounds.  Chest:  Breasts:    Right: Normal.     Left: Normal.  Abdominal:     Palpations: Abdomen is soft.  Genitourinary:    Vagina: No vaginal discharge.  Musculoskeletal:     Cervical back: Normal range of motion and neck supple.     Comments: Trace lower extremity edema.  Skin:    General: Skin is warm and dry.  Neurological:     General: No focal deficit present.     Mental Status: She is alert and oriented to person, place, and time. Mental status is at baseline.     Deep Tendon Reflexes: Reflexes are normal and symmetric.  Psychiatric:        Mood and Affect: Mood normal.        Behavior: Behavior normal.        Thought Content: Thought content normal.        Judgment: Judgment normal.     Most recent functional status assessment: In your present state of health, do you have any difficulty performing the following activities: 08/26/2021  Hearing? N  Vision? Y  Difficulty concentrating or making decisions? N  Walking or climbing stairs? N  Dressing or bathing? N  Doing errands, shopping? N  Some recent data might be hidden    Most recent fall risk assessment: Fall Risk  08/26/2021  Falls in the past year? 0  Number falls in past yr: 0  Injury with Fall? 0  Risk for fall due to : No Fall Risks  Follow up Falls evaluation completed    Most recent depression screenings: PHQ 2/9 Scores 08/26/2021 02/19/2021  PHQ - 2 Score 0 0  PHQ- 9 Score 0 1   Most recent cognitive screening: No flowsheet data found. Most recent Audit-C alcohol use screening Alcohol Use Disorder  Test (AUDIT) 08/26/2021  1. How often do you have a drink containing alcohol? 0  2. How many drinks containing alcohol do you have on a typical day when you are drinking? 0  3. How often do you have six or more drinks on one occasion? 0  AUDIT-C Score 0  Alcohol Brief Interventions/Follow-up -   A score of 3 or more in women, and 4 or more in men indicates increased risk for alcohol abuse, EXCEPT if all of the points are from question 1   Results for orders placed or performed in visit on 08/26/21  POCT glycosylated hemoglobin (Hb A1C)  Result Value Ref Range   Hemoglobin A1C 6.8 (A) 4.0 - 5.6 %   Est. average glucose Bld gHb Est-mCnc 148     Assessment & Plan     Annual wellness visit done today including the all of the following: Reviewed patient's Family Medical History Reviewed and updated list of patient's medical providers Assessment of cognitive impairment was done Assessed patient's functional ability Established a written schedule for health screening services Health Risk Assessent Completed and Reviewed  Exercise Activities and Dietary recommendations  Goals      Reduce calorie intake to 2000 calories per day     Recommend monitoring calorie intake and avoiding going over 2000 calories a day.         Immunization History  Administered Date(s) Administered   Influenza, High Dose Seasonal PF 09/17/2017, 09/13/2019   Influenza,inj,quad, With Preservative 12/06/2016   Influenza-Unspecified 10/21/2015, 01/01/2016    PFIZER(Purple Top)SARS-COV-2 Vaccination 12/20/2019, 01/10/2020   Pneumococcal Conjugate-13 03/22/2014   Pneumococcal-Unspecified 09/03/2013   Tdap 10/25/2014   Zoster Recombinat (Shingrix) 09/13/2019, 11/13/2019   Zoster, Live 11/01/2011    Health Maintenance  Topic Date Due   Hepatitis C Screening  Never done   FOOT EXAM  10/04/2017   COVID-19 Vaccine (4 - Booster for Pfizer series) 12/18/2020   INFLUENZA VACCINE  07/06/2021   HEMOGLOBIN A1C  02/23/2022   OPHTHALMOLOGY EXAM  06/18/2022   TETANUS/TDAP  10/25/2024   DEXA SCAN  Completed   Zoster Vaccines- Shingrix  Completed   HPV VACCINES  Aged Out     Discussed health benefits of physical activity, and encouraged her to engage in regular exercise appropriate for her age and condition.    1. Encounter for annual wellness exam in Medicare patient   2. Annual physical exam   3. Type 2 diabetes mellitus without complication, without long-term current use of insulin (HCC)  - Accu-Chek FastClix Lancets MISC; Check blood glucose once daily  Dispense: 102 each; Refill: 3 - POCT glycosylated hemoglobin (Hb A1C)  4. Essential hypertension   5. Leg cramps Try tonic water at night.    Return in about 4 months (around 12/31/2021).     I, Wilhemena Durie, MD, have reviewed all documentation for this visit. The documentation on 08/29/21 for the exam, diagnosis, procedures, and orders are all accurate and complete.    Keniel Ralston Cranford Mon, MD  [facility] [phone] (phone) [phone] (fax)  [place]

## 2021-08-26 NOTE — Patient Instructions (Signed)
TRY TONIC WATER AT NIGHT FOR CRAMPS.

## 2021-08-28 ENCOUNTER — Other Ambulatory Visit: Payer: Self-pay | Admitting: Family Medicine

## 2021-08-28 DIAGNOSIS — E7800 Pure hypercholesterolemia, unspecified: Secondary | ICD-10-CM

## 2021-08-29 ENCOUNTER — Other Ambulatory Visit: Payer: Self-pay | Admitting: Family Medicine

## 2021-08-29 DIAGNOSIS — I10 Essential (primary) hypertension: Secondary | ICD-10-CM

## 2021-09-28 ENCOUNTER — Other Ambulatory Visit: Payer: Self-pay | Admitting: Family Medicine

## 2021-09-28 DIAGNOSIS — E119 Type 2 diabetes mellitus without complications: Secondary | ICD-10-CM

## 2021-09-29 NOTE — Telephone Encounter (Signed)
Requested Prescriptions  Pending Prescriptions Disp Refills  . glipiZIDE (GLUCOTROL XL) 2.5 MG 24 hr tablet [Pharmacy Med Name: glipiZIDE ER 2.5 MG Oral Tablet Extended Release 24 Hour] 90 tablet 1    Sig: Take 1 tablet by mouth once daily     Endocrinology:  Diabetes - Sulfonylureas Passed - 09/28/2021  9:59 PM      Passed - HBA1C is between 0 and 7.9 and within 180 days    Hemoglobin A1C  Date Value Ref Range Status  08/26/2021 6.8 (A) 4.0 - 5.6 % Final   Hgb A1c MFr Bld  Date Value Ref Range Status  08/21/2020 6.7 (H) 4.8 - 5.6 % Final    Comment:             Prediabetes: 5.7 - 6.4          Diabetes: >6.4          Glycemic control for adults with diabetes: <7.0          Passed - Valid encounter within last 6 months    Recent Outpatient Visits          1 month ago Encounter for annual wellness exam in Medicare patient   [facility] Rosanna Randy, Retia Passe., MD   7 months ago Type 2 diabetes mellitus without complication, without long-term current use of insulin [facility])   [facility] Jerrol Banana., MD   1 year ago Type 2 diabetes mellitus without complication, without long-term current use of insulin [facility])   [facility] Jerrol Banana., MD   1 year ago Annual physical exam   [facility] Jerrol Banana., MD   1 year ago Type 2 diabetes mellitus without complication, without long-term current use of insulin [facility])   [facility] Jerrol Banana., MD      Future Appointments            In 3 months Jerrol Banana., MD [facility]

## 2021-09-30 ENCOUNTER — Ambulatory Visit (INDEPENDENT_AMBULATORY_CARE_PROVIDER_SITE_OTHER): Payer: Medicare PPO | Admitting: Physician Assistant

## 2021-09-30 ENCOUNTER — Other Ambulatory Visit: Payer: Self-pay

## 2021-09-30 ENCOUNTER — Encounter: Payer: Self-pay | Admitting: Physician Assistant

## 2021-09-30 VITALS — BP 122/88 | HR 49 | Temp 97.5°F | Wt 206.5 lb

## 2021-09-30 DIAGNOSIS — H66002 Acute suppurative otitis media without spontaneous rupture of ear drum, left ear: Secondary | ICD-10-CM

## 2021-09-30 DIAGNOSIS — R0981 Nasal congestion: Secondary | ICD-10-CM | POA: Diagnosis not present

## 2021-09-30 MED ORDER — FLUTICASONE PROPIONATE 50 MCG/ACT NA SUSP: 2.0000 | Freq: Every day | NASAL | 6 refills | Status: DC

## 2021-09-30 MED ORDER — DOXYCYCLINE HYCLATE 100 MG PO TABS: 100.0000 mg | ORAL_TABLET | Freq: Two times a day (BID) | ORAL | 0 refills | Status: AC

## 2021-09-30 NOTE — Progress Notes (Signed)
Established patient visit   Patient: Jennifer Mcgee   DOB: [birth year]   80 y.o. Female  MRN: [id number] Visit Date: 09/30/2021  Today's healthcare provider: Mikey Kirschner, PA-C   Chief Complaint  Patient presents with   Ear Pain   Subjective    Otalgia  There is pain in the left ear. This is a new problem. The current episode started in the past 7 days. The problem occurs constantly. The problem has been unchanged. There has been no fever. Associated symptoms include rhinorrhea. Pertinent negatives include no abdominal pain, coughing, diarrhea, ear discharge, headaches, hearing loss, neck pain, rash, sore throat or vomiting. She has tried nothing for the symptoms. There is no history of a chronic ear infection or hearing loss.  She reports the pain radiates into her head. Denies taking advil/tylenol/antihistamines.  Medications: Outpatient Medications Prior to Visit  Medication Sig   Accu-Chek FastClix Lancets MISC Check blood glucose once daily   ACCU-CHEK SMARTVIEW test strip USE WITH METER TO CHECK GLUCOSE DAILY   atorvastatin (LIPITOR) 20 MG tablet TAKE 1 TABLET BY MOUTH EVERYDAY AT BEDTIME   ferrous sulfate 325 (65 FE) MG tablet Take 325 mg by mouth daily with breakfast.    glipiZIDE (GLUCOTROL XL) 2.5 MG 24 hr tablet Take 1 tablet by mouth once daily   hydrochlorothiazide (MICROZIDE) 12.5 MG capsule TAKE 1 CAPSULE BY MOUTH EVERY DAY   magnesium gluconate (MAGONATE) 500 MG tablet Take 500 mg by mouth daily.    metFORMIN (GLUCOPHAGE) 1000 MG tablet TAKE 1 TABLET (1,000 MG TOTAL) BY MOUTH 2 (TWO) TIMES DAILY WITH A MEAL.   Multiple Vitamin (MULTIVITAMIN) tablet Take 1 tablet by mouth daily.   Multiple Vitamins-Minerals (PRESERVISION AREDS 2 PO) Take 1 capsule by mouth 2 (two) times daily.   quinapril (ACCUPRIL) 40 MG tablet TAKE 1 TABLET BY MOUTH EVERY DAY   vitamin B-12 (CYANOCOBALAMIN) 1000 MCG tablet Take 1,000 mcg by mouth daily.   [DISCONTINUED] acetaminophen  (TYLENOL) 325 MG tablet Take 650 mg by mouth every 6 (six) hours as needed.   No facility-administered medications prior to visit.    Review of Systems  HENT:  Positive for ear pain and rhinorrhea. Negative for ear discharge, hearing loss and sore throat.   Respiratory:  Negative for cough.   Gastrointestinal:  Negative for abdominal pain, diarrhea and vomiting.  Musculoskeletal:  Negative for neck pain.  Skin:  Negative for rash.  Neurological:  Negative for headaches.     Objective    BP 122/88   Pulse (!) 49   Temp (!) 97.5 F (36.4 C) (Oral)   Wt 206 lb 8 oz (93.7 kg)   BMI 36.58 kg/m    Physical Exam Constitutional:      General: She is awake.     Appearance: She is well-developed.  HENT:     Head: Normocephalic.     Right Ear: Tympanic membrane normal.     Ears:     Comments: Left TM dull, erythematous and mildly bulging.    Nose: Congestion and rhinorrhea present.  Eyes:     Conjunctiva/sclera: Conjunctivae normal.  Pulmonary:     Effort: Pulmonary effort is normal.  Skin:    General: Skin is warm.  Neurological:     Mental Status: She is alert and oriented to person, place, and time.  Psychiatric:        Attention and Perception: Attention normal.        Mood and Affect:  Mood normal.        Speech: Speech normal.        Behavior: Behavior is cooperative.      No results found for any visits on 09/30/21.  Assessment & Plan     Otitis media, left Pt allergic to PCN, rx doxy 100 mg bid x 7 days Advised her to start flonase daily for congestion Can take advil/tylenol as needed for pain relief.  Return if symptoms worsen or fail to improve.      I, Mikey Kirschner, PA-C have reviewed all documentation for this visit. The documentation on  09/30/2021 for the exam, diagnosis, procedures, and orders are all accurate and complete.    Mikey Kirschner, PA-C  [facility] [phone] (phone) [phone] (fax)  [place]

## 2021-10-22 ENCOUNTER — Other Ambulatory Visit: Payer: Self-pay | Admitting: Family Medicine

## 2021-10-22 DIAGNOSIS — I10 Essential (primary) hypertension: Secondary | ICD-10-CM

## 2021-10-22 DIAGNOSIS — E119 Type 2 diabetes mellitus without complications: Secondary | ICD-10-CM

## 2021-10-22 NOTE — Telephone Encounter (Signed)
Requested Prescriptions  Pending Prescriptions Disp Refills  . hydrochlorothiazide (MICROZIDE) 12.5 MG capsule [Pharmacy Med Name: HYDROCHLOROTHIAZIDE 12.5 MG CP] 90 capsule 1    Sig: TAKE 1 CAPSULE BY MOUTH EVERY DAY     Cardiovascular: Diuretics - Thiazide Failed - 10/22/2021  1:42 AM      Failed - Na in normal range and within 360 days    Sodium  Date Value Ref Range Status  02/19/2021 146 (H) 134 - 144 mmol/L Final         Passed - Ca in normal range and within 360 days    Calcium  Date Value Ref Range Status  02/19/2021 10.2 8.7 - 10.3 mg/dL Final         Passed - Cr in normal range and within 360 days    Creat  Date Value Ref Range Status  08/31/2017 0.94 (H) 0.60 - 0.93 mg/dL Final    Comment:    For patients >79 years of age, the reference limit for Creatinine is approximately 13% higher for people identified as African-American. .    Creatinine, Ser  Date Value Ref Range Status  02/19/2021 0.86 0.57 - 1.00 mg/dL Final         Passed - K in normal range and within 360 days    Potassium  Date Value Ref Range Status  02/19/2021 5.1 3.5 - 5.2 mmol/L Final         Passed - Last BP in normal range    BP Readings from Last 1 Encounters:  09/30/21 122/88         Passed - Valid encounter within last 6 months    Recent Outpatient Visits          3 weeks ago Non-recurrent acute suppurative otitis media of left ear without spontaneous rupture of tympanic membrane   [facility] Mikey Kirschner, PA-C   1 month ago Encounter for annual wellness exam in Medicare patient   [facility] Rosanna Randy, Retia Passe., MD   8 months ago Type 2 diabetes mellitus without complication, without long-term current use of insulin [facility])   [facility] Jerrol Banana., MD   1 year ago Type 2 diabetes mellitus without complication, without long-term current use of insulin [facility])   [facility] Jerrol Banana., MD   1  year ago Annual physical exam   [facility] Jerrol Banana., MD      Future Appointments            In 2 months Jerrol Banana., MD [facility]           . metFORMIN (GLUCOPHAGE) 1000 MG tablet [Pharmacy Med Name: METFORMIN HCL 1,000 MG TABLET] 180 tablet 1    Sig: TAKE 1 TABLET (1,000 MG TOTAL) BY MOUTH 2 (TWO) TIMES DAILY WITH A MEAL.     Endocrinology:  Diabetes - Biguanides Passed - 10/22/2021  1:42 AM      Passed - Cr in normal range and within 360 days    Creat  Date Value Ref Range Status  08/31/2017 0.94 (H) 0.60 - 0.93 mg/dL Final    Comment:    For patients >59 years of age, the reference limit for Creatinine is approximately 13% higher for people identified as African-American. .    Creatinine, Ser  Date Value Ref Range Status  02/19/2021 0.86 0.57 - 1.00 mg/dL Final  Passed - HBA1C is between 0 and 7.9 and within 180 days    Hemoglobin A1C  Date Value Ref Range Status  08/26/2021 6.8 (A) 4.0 - 5.6 % Final   Hgb A1c MFr Bld  Date Value Ref Range Status  08/21/2020 6.7 (H) 4.8 - 5.6 % Final    Comment:             Prediabetes: 5.7 - 6.4          Diabetes: >6.4          Glycemic control for adults with diabetes: <7.0          Passed - eGFR in normal range and within 360 days    GFR calc Af Amer  Date Value Ref Range Status  02/21/2020 73 >59 mL/min/1.73 Final   GFR calc non Af Amer  Date Value Ref Range Status  02/21/2020 63 >59 mL/min/1.73 Final   eGFR  Date Value Ref Range Status  02/19/2021 69 >59 mL/min/1.73 Final         Passed - Valid encounter within last 6 months    Recent Outpatient Visits          3 weeks ago Non-recurrent acute suppurative otitis media of left ear without spontaneous rupture of tympanic membrane   [facility] Mikey Kirschner, PA-C   1 month ago Encounter for annual wellness exam in Medicare patient   [facility] Rosanna Randy, Retia Passe., MD   8 months ago Type 2 diabetes mellitus without complication, without long-term current use of insulin [facility])   [facility] Jerrol Banana., MD   1 year ago Type 2 diabetes mellitus without complication, without long-term current use of insulin [facility])   [facility] Jerrol Banana., MD   1 year ago Annual physical exam   [facility] Jerrol Banana., MD      Future Appointments            In 2 months Jerrol Banana., MD [facility], [place]

## 2021-12-03 ENCOUNTER — Other Ambulatory Visit: Payer: Self-pay | Admitting: Family Medicine

## 2021-12-03 DIAGNOSIS — E7800 Pure hypercholesterolemia, unspecified: Secondary | ICD-10-CM

## 2021-12-17 DIAGNOSIS — E119 Type 2 diabetes mellitus without complications: Secondary | ICD-10-CM | POA: Diagnosis not present

## 2021-12-17 DIAGNOSIS — H35342 Macular cyst, hole, or pseudohole, left eye: Secondary | ICD-10-CM | POA: Diagnosis not present

## 2021-12-17 DIAGNOSIS — H43811 Vitreous degeneration, right eye: Secondary | ICD-10-CM | POA: Diagnosis not present

## 2021-12-17 DIAGNOSIS — H35371 Puckering of macula, right eye: Secondary | ICD-10-CM | POA: Diagnosis not present

## 2021-12-17 DIAGNOSIS — H353132 Nonexudative age-related macular degeneration, bilateral, intermediate dry stage: Secondary | ICD-10-CM | POA: Diagnosis not present

## 2022-01-06 ENCOUNTER — Other Ambulatory Visit: Payer: Self-pay

## 2022-01-06 ENCOUNTER — Encounter: Payer: Self-pay | Admitting: Family Medicine

## 2022-01-06 ENCOUNTER — Ambulatory Visit (INDEPENDENT_AMBULATORY_CARE_PROVIDER_SITE_OTHER): Payer: Medicare PPO | Admitting: Family Medicine

## 2022-01-06 VITALS — BP 101/72 | HR 70 | Temp 98.3°F | Resp 16 | Ht 63.0 in | Wt 200.0 lb

## 2022-01-06 DIAGNOSIS — E119 Type 2 diabetes mellitus without complications: Secondary | ICD-10-CM | POA: Diagnosis not present

## 2022-01-06 DIAGNOSIS — I10 Essential (primary) hypertension: Secondary | ICD-10-CM

## 2022-01-06 NOTE — Progress Notes (Signed)
Established patient visit  I,April Miller,acting as a scribe for Wilhemena Durie, MD.,have documented all relevant documentation on the behalf of Wilhemena Durie, MD,as directed by  Wilhemena Durie, MD while in the presence of Wilhemena Durie, MD.   Patient: Jennifer Mcgee   DOB: 07-Jan-1941   81 y.o. Female  MRN: [id number] Visit Date: 01/06/2022  Today's healthcare provider: Wilhemena Durie, MD   Chief Complaint  Patient presents with   Follow-up   Diabetes   Hypertension   Hyperlipidemia   Subjective    HPI   Patient comes in today for follow-up.  She has been feeling well with no physical complaints.  She is married and is the mother of 5 with 5 grandchildren and 1 great-grandchild.  The patient becomes tearful during the exam as she describes finding that one of her daughters has significant mental illness that she did not know about and now that granddaughter threw that daughter is now living with the patient for the past week with severe stress and emotional distress.  This is causing great problems within the family.  There is no danger to the patient or the child  Diabetes Mellitus Type II, follow-up  Lab Results  Component Value Date   HGBA1C 6.8 (A) 08/26/2021   HGBA1C 6.8 (A) 02/19/2021   HGBA1C 6.7 (H) 08/21/2020   Last seen for diabetes 5 months ago.  Management since then includes continuing the same treatment. She reports good compliance with treatment. She is not having side effects. none  Home blood sugar records: fasting range: 100  Episodes of hypoglycemia? No none   Current insulin regiment: n/a Most Recent Eye Exam: 12/2021  --------------------------------------------------------------------------------------------------- Hypertension, follow-up  BP Readings from Last 3 Encounters:  01/06/22 101/72  09/30/21 122/88  08/26/21 (!) 148/81   Wt Readings from Last 3 Encounters:  01/06/22 200 lb (90.7 kg)  09/30/21 206 lb 8 oz  (93.7 kg)  08/26/21 209 lb (94.8 kg)     She was last seen for hypertension 5 months ago.  BP at that visit was 148/81. Management since that visit includes; on quinapril and HCTZ. She reports good compliance with treatment. She is not having side effects. none She is exercising. She is adherent to low salt diet.   Outside blood pressures are not checking.  She does not smoke.  Use of agents associated with hypertension: none.   --------------------------------------------------------------------------------------------------- Lipid/Cholesterol, follow-up  Last Lipid Panel: Lab Results  Component Value Date   CHOL 179 02/19/2021   LDLCALC 97 02/19/2021   HDL 62 02/19/2021   TRIG 111 02/19/2021    She was last seen for this 11 months ago.  Management since that visit includes; taking atorvastatin.  She reports good compliance with treatment. She is not having side effects. none  She is following a Regular diet. Current exercise: walking  Last metabolic panel Lab Results  Component Value Date   GLUCOSE 143 (H) 02/19/2021   NA 146 (H) 02/19/2021   K 5.1 02/19/2021   BUN 16 02/19/2021   CREATININE 0.86 02/19/2021   EGFR 69 02/19/2021   GFRNONAA 63 02/21/2020   CALCIUM 10.2 02/19/2021   AST 21 02/19/2021   ALT 17 02/19/2021   The ASCVD Risk score (Arnett DK, et al., 2019) failed to calculate for the following reasons:   The 2019 ASCVD risk score is only valid for ages 26 to 81  ---------------------------------------------------------------------------------------------------   Medications: Outpatient Medications Prior to Visit  Medication Sig   Accu-Chek FastClix Lancets MISC Check blood glucose once daily   ACCU-CHEK SMARTVIEW test strip USE WITH METER TO CHECK GLUCOSE DAILY   atorvastatin (LIPITOR) 20 MG tablet TAKE 1 TABLET BY MOUTH EVERYDAY AT BEDTIME   ferrous sulfate 325 (65 FE) MG tablet Take 325 mg by mouth daily with breakfast.    glipiZIDE  (GLUCOTROL XL) 2.5 MG 24 hr tablet Take 1 tablet by mouth once daily   hydrochlorothiazide (MICROZIDE) 12.5 MG capsule TAKE 1 CAPSULE BY MOUTH EVERY DAY   magnesium gluconate (MAGONATE) 500 MG tablet Take 500 mg by mouth daily.    metFORMIN (GLUCOPHAGE) 1000 MG tablet TAKE 1 TABLET (1,000 MG TOTAL) BY MOUTH 2 (TWO) TIMES DAILY WITH A MEAL.   Multiple Vitamin (MULTIVITAMIN) tablet Take 1 tablet by mouth daily.   Multiple Vitamins-Minerals (PRESERVISION AREDS 2 PO) Take 1 capsule by mouth 2 (two) times daily.   quinapril (ACCUPRIL) 40 MG tablet TAKE 1 TABLET BY MOUTH EVERY DAY   vitamin B-12 (CYANOCOBALAMIN) 1000 MCG tablet Take 1,000 mcg by mouth daily.   [DISCONTINUED] fluticasone (FLONASE) 50 MCG/ACT nasal spray Place 2 sprays into both nostrils daily.   No facility-administered medications prior to visit.    Review of Systems  Constitutional:  Negative for appetite change, chills, fatigue and fever.  Respiratory:  Negative for chest tightness and shortness of breath.   Cardiovascular:  Negative for chest pain and palpitations.  Gastrointestinal:  Negative for abdominal pain, nausea and vomiting.  Neurological:  Negative for dizziness and weakness.   Last hemoglobin A1c Lab Results  Component Value Date   HGBA1C 7.3 (H) 01/06/2022       Objective    BP 101/72 (BP Location: Right Arm, Patient Position: Sitting, Cuff Size: Large)    Pulse 70    Temp 98.3 F (36.8 C) (Temporal)    Resp 16    Ht 5' 3"  (1.6 m)    Wt 200 lb (90.7 kg)    SpO2 99%    BMI 35.43 kg/m  BP Readings from Last 3 Encounters:  01/06/22 101/72  09/30/21 122/88  08/26/21 (!) 148/81   Wt Readings from Last 3 Encounters:  01/06/22 200 lb (90.7 kg)  09/30/21 206 lb 8 oz (93.7 kg)  08/26/21 209 lb (94.8 kg)      Physical Exam Vitals reviewed.  Constitutional:      Appearance: She is well-developed. She is obese.  HENT:     Head: Normocephalic and atraumatic.     Right Ear: Tympanic membrane and external  ear normal.     Left Ear: Tympanic membrane and external ear normal.     Nose: Nose normal.     Mouth/Throat:     Mouth: Mucous membranes are moist.     Pharynx: Oropharynx is clear.  Eyes:     General: No scleral icterus.    Extraocular Movements: Extraocular movements intact.     Conjunctiva/sclera: Conjunctivae normal.     Pupils: Pupils are equal, round, and reactive to light.  Neck:     Thyroid: No thyromegaly.  Cardiovascular:     Rate and Rhythm: Normal rate and regular rhythm.  Pulmonary:     Effort: Pulmonary effort is normal.     Breath sounds: Normal breath sounds.  Abdominal:     Palpations: Abdomen is soft.  Musculoskeletal:        General: Normal range of motion.     Cervical back: Normal range of motion and neck supple.  Comments: Trace lower extremity edema. She has pain with abduction of her left shoulder past 90 degrees  Skin:    General: Skin is warm and dry.  Neurological:     General: No focal deficit present.     Mental Status: She is alert and oriented to person, place, and time. Mental status is at baseline.     Deep Tendon Reflexes: Reflexes are normal and symmetric.  Psychiatric:        Mood and Affect: Mood normal.        Behavior: Behavior normal.        Thought Content: Thought content normal.        Judgment: Judgment normal.      No results found for any visits on 01/06/22.  Assessment & Plan     1. Type 2 diabetes mellitus without complication, without long-term current use of insulin (HCC) Continue glipizide and metformin.  If the patient develops any significant hypoglycemia will discontinue glipizide. - Hemoglobin A1c  2. Essential hypertension Well-controlled on quinapril 40   Return in about 4 months (around 05/06/2022).      I, Wilhemena Durie, MD, have reviewed all documentation for this visit. The documentation on 01/07/22 for the exam, diagnosis, procedures, and orders are all accurate and complete.    Kalin Amrhein  Cranford Mon, MD  [facility] [phone] (phone) [phone] (fax)  [place]

## 2022-01-07 LAB — HEMOGLOBIN A1C
Est. average glucose Bld gHb Est-mCnc: 163 mg/dL
Hgb A1c MFr Bld: 7.3 % — ABNORMAL HIGH (ref 4.8–5.6)

## 2022-02-10 ENCOUNTER — Other Ambulatory Visit: Payer: Self-pay | Admitting: Family Medicine

## 2022-02-10 DIAGNOSIS — Z1231 Encounter for screening mammogram for malignant neoplasm of breast: Secondary | ICD-10-CM

## 2022-02-17 ENCOUNTER — Other Ambulatory Visit: Payer: Self-pay | Admitting: Family Medicine

## 2022-02-17 DIAGNOSIS — I10 Essential (primary) hypertension: Secondary | ICD-10-CM

## 2022-02-17 NOTE — Telephone Encounter (Signed)
Requested medication (s) are due for refill today:due 02/27/22 ? ?Requested medication (s) are on the active medication list: yes   ? ?Last refill: 08/30/21  #90  1 refill ? ?Future visit scheduled yes 05/10/22 ? ?Notes to clinic:Failed due to labs, please review. Thank you. ? ?Requested Prescriptions  ?Pending Prescriptions Disp Refills  ? quinapril (ACCUPRIL) 40 MG tablet [Pharmacy Med Name: QUINAPRIL 40 MG TABLET] 90 tablet 1  ?  Sig: TAKE 1 TABLET BY MOUTH EVERY DAY  ?  ? Cardiovascular: ACE Inhibitors 2 Failed - 02/17/2022  2:53 PM  ?  ?  Failed - K in normal range and within 180 days  ?  Potassium  ?Date Value Ref Range Status  ?02/19/2021 5.1 3.5 - 5.2 mmol/L Final  ?  ?  ?  ?  Failed - Cr in normal range and within 180 days  ?  Creat  ?Date Value Ref Range Status  ?08/31/2017 0.94 (H) 0.60 - 0.93 mg/dL Final  ?  Comment:  ?  For patients >42 years of age, the reference limit ?for Creatinine is approximately 13% higher for people ?identified as African-American. ?. ?  ? ?Creatinine, Ser  ?Date Value Ref Range Status  ?02/19/2021 0.86 0.57 - 1.00 mg/dL Final  ?  ?  ?  ?  Failed - eGFR is 30 or above and within 180 days  ?  GFR calc Af Amer  ?Date Value Ref Range Status  ?02/21/2020 73 >59 mL/min/1.73 Final  ? ?GFR calc non Af Amer  ?Date Value Ref Range Status  ?02/21/2020 63 >59 mL/min/1.73 Final  ? ?eGFR  ?Date Value Ref Range Status  ?02/19/2021 69 >59 mL/min/1.73 Final  ?  ?  ?  ?  Passed - Patient is not pregnant  ?  ?  Passed - Last BP in normal range  ?  BP Readings from Last 1 Encounters:  ?01/06/22 101/72  ?  ?  ?  ?  Passed - Valid encounter within last 6 months  ?  Recent Outpatient Visits   ? ?      ? 1 month ago Type 2 diabetes mellitus without complication, without long-term current use of insulin ([place])  ? [facility] Jerrol Banana., MD  ? 4 months ago Non-recurrent acute suppurative otitis media of left ear without spontaneous rupture of tympanic membrane  ? [facility] [place] Thedore Mins, Ria Comment, PA-C  ? 5 months ago Encounter for annual wellness exam in Medicare patient  ? [facility] Jerrol Banana., MD  ? 12 months ago Type 2 diabetes mellitus without complication, without long-term current use of insulin ([place])  ? [facility] Jerrol Banana., MD  ? 1 year ago Type 2 diabetes mellitus without complication, without long-term current use of insulin ([place])  ? [facility] Jerrol Banana., MD  ? ?  ?  ?Future Appointments   ? ?        ? In 2 months Jerrol Banana., MD [facility]  ? ?  ? ?  ?  ?  ? ? ? ? ?

## 2022-03-12 ENCOUNTER — Telehealth: Payer: Self-pay

## 2022-03-12 ENCOUNTER — Other Ambulatory Visit: Payer: Self-pay | Admitting: Physician Assistant

## 2022-03-12 DIAGNOSIS — I10 Essential (primary) hypertension: Secondary | ICD-10-CM

## 2022-03-12 MED ORDER — LISINOPRIL 40 MG PO TABS: 40.0000 mg | ORAL_TABLET | Freq: Every day | ORAL | 1 refills | Status: DC

## 2022-03-12 NOTE — Telephone Encounter (Signed)
Dr. Rosanna Randy patient......Jennifer KitchenPatient came by office stating the Quinapril has been recalled and needs something else sent into [facility] on [address].

## 2022-03-18 ENCOUNTER — Ambulatory Visit (INDEPENDENT_AMBULATORY_CARE_PROVIDER_SITE_OTHER): Payer: Medicare PPO | Admitting: Dermatology

## 2022-03-18 DIAGNOSIS — L719 Rosacea, unspecified: Secondary | ICD-10-CM | POA: Diagnosis not present

## 2022-03-18 DIAGNOSIS — D2239 Melanocytic nevi of other parts of face: Secondary | ICD-10-CM

## 2022-03-18 DIAGNOSIS — D229 Melanocytic nevi, unspecified: Secondary | ICD-10-CM

## 2022-03-18 DIAGNOSIS — Z85828 Personal history of other malignant neoplasm of skin: Secondary | ICD-10-CM

## 2022-03-18 DIAGNOSIS — L821 Other seborrheic keratosis: Secondary | ICD-10-CM

## 2022-03-18 DIAGNOSIS — L814 Other melanin hyperpigmentation: Secondary | ICD-10-CM

## 2022-03-18 DIAGNOSIS — D1800 Hemangioma unspecified site: Secondary | ICD-10-CM

## 2022-03-18 DIAGNOSIS — L578 Other skin changes due to chronic exposure to nonionizing radiation: Secondary | ICD-10-CM

## 2022-03-18 DIAGNOSIS — L820 Inflamed seborrheic keratosis: Secondary | ICD-10-CM | POA: Diagnosis not present

## 2022-03-18 DIAGNOSIS — Z1283 Encounter for screening for malignant neoplasm of skin: Secondary | ICD-10-CM

## 2022-03-18 DIAGNOSIS — D492 Neoplasm of unspecified behavior of bone, soft tissue, and skin: Secondary | ICD-10-CM

## 2022-03-18 NOTE — Patient Instructions (Addendum)
Wound Care Instructions ? ?Cleanse wound gently with soap and water once a day then pat dry with clean gauze. Apply a thing coat of Petrolatum (petroleum jelly, "Vaseline") over the wound (unless you have an allergy to this). We recommend that you use a new, sterile tube of Vaseline. Do not pick or remove scabs. Do not remove the yellow or white "healing tissue" from the base of the wound. ? ?Cover the wound with fresh, clean, nonstick gauze and secure with paper tape. You may use Band-Aids in place of gauze and tape if the would is small enough, but would recommend trimming much of the tape off as there is often too much. Sometimes Band-Aids can irritate the skin. ? ?You should call the office for your biopsy report after 1 week if you have not already been contacted. ? ?If you experience any problems, such as abnormal amounts of bleeding, swelling, significant bruising, significant pain, or evidence of infection, please call the office immediately. ? ?FOR ADULT SURGERY PATIENTS: If you need something for pain relief you may take 1 extra strength Tylenol (acetaminophen) AND 2 Ibuprofen ('200mg'$  each) together every 4 hours as needed for pain. (do not take these if you are allergic to them or if you have a reason you should not take them.) Typically, you may only need pain medication for 1 to 3 days.  ? ? ? ? ?Cryotherapy Aftercare ? ?Wash gently with soap and water everyday.   ?Apply Vaseline and Band-Aid daily until healed.  ? ? ?If You Need Anything After Your Visit ? ?If you have any questions or concerns for your doctor, please call our main line at [phone] and press option 4 to reach your doctor's medical assistant. If no one answers, please leave a voicemail as directed and we will return your call as soon as possible. Messages left after 4 pm will be answered the following business day.  ? ?You may also send [place] a message via MyChart. We typically respond to MyChart messages within 1-2 business  days. ? ?For prescription refills, please ask your pharmacy to contact our office. Our fax number is [phone]. ? ?If you have an urgent issue when the clinic is closed that cannot wait until the next business day, you can page your doctor at the number below.   ? ?Please note that while we do our best to be available for urgent issues outside of office hours, we are not available 24/7.  ? ?If you have an urgent issue and are unable to reach [place], you may choose to seek medical care at your doctor's office, retail clinic, urgent care center, or emergency room. ? ?If you have a medical emergency, please immediately call 911 or go to the emergency department. ? ?Pager Numbers ? ?- Dr. Nehemiah Massed: [phone] ? ?- Dr. Laurence Ferrari: [phone] ? ?- Dr. Nicole Kindred: [phone] ? ?In the event of inclement weather, please call our main line at [phone] for an update on the status of any delays or closures. ? ?Dermatology Medication Tips: ?Please keep the boxes that topical medications come in in order to help keep track of the instructions about where and how to use these. Pharmacies typically print the medication instructions only on the boxes and not directly on the medication tubes.  ? ?If your medication is too expensive, please contact our office at [phone] option 4 or send [place] a message through [place].  ? ?We are unable to tell what your co-pay for medications will be in advance  as this is different depending on your insurance coverage. However, we may be able to find a substitute medication at lower cost or fill out paperwork to get insurance to cover a needed medication.  ? ?If a prior authorization is required to get your medication covered by your insurance company, please allow us 1-2 business days to complete this process. ? ?Drug prices often vary depending on where the prescription is filled and some pharmacies may offer cheaper prices. ? ?The website www.goodrx.com contains coupons for medications through  different pharmacies. The prices here do not account for what the cost may be with help from insurance (it may be cheaper with your insurance), but the website can give you the price if you did not use any insurance.  ?- You can print the associated coupon and take it with your prescription to the pharmacy.  ?- You may also stop by our office during regular business hours and pick up a GoodRx coupon card.  ?- If you need your prescription sent electronically to a different pharmacy, notify our office through [place] MyChart or by phone at [phone] option 4. ? ? ? ? ?Si Usted Necesita Algo Despu?s de Su Visita ? ?Tambi?n puede enviarnos un mensaje a trav?s de MyChart. Por lo general respondemos a los mensajes de MyChart en el transcurso de 1 a 2 d?as h?biles. ? ?Para renovar recetas, por favor pida a su farmacia que se ponga en contacto con nuestra oficina. Nuestro n?mero de fax es el [phone]. ? ?Si tiene un asunto urgente cuando la cl?nica est? cerrada y que no puede esperar hasta el siguiente d?a h?bil, puede llamar/localizar a su doctor(a) al n?mero que aparece a continuaci?n.  ? ?Por favor, tenga en cuenta que aunque hacemos todo lo posible para estar disponibles para asuntos urgentes fuera del horario de oficina, no estamos disponibles las 24 horas del d?a, los 7 d?as de la semana.  ? ?Si tiene un problema urgente y no puede comunicarse con nosotros, puede optar por buscar atenci?n m?dica  en el consultorio de su doctor(a), en una cl?nica privada, en un centro de atenci?n urgente o en una sala de emergencias. ? ?Si tiene una emergencia m?dica, por favor llame inmediatamente al 911 o vaya a la sala de emergencias. ? ?N?meros de b?per ? ?- Dr. Kowalski: [phone] ? ?- Dra. Moye: [phone] ? ?- Dra. Stewart: [phone] ? ?En caso de inclemencias del tiempo, por favor llame a nuestra l?nea principal al [phone] para una actualizaci?n sobre el estado de cualquier retraso o cierre. ? ?Consejos  para la medicaci?n en dermatolog?a: ?Por favor, guarde las cajas en las que vienen los medicamentos de uso t?pico para ayudarle a seguir las instrucciones sobre d?nde y c?mo usarlos. Las farmacias generalmente imprimen las instrucciones del medicamento s?lo en las cajas y no directamente en los tubos del medicamento.  ? ?Si su medicamento es muy caro, por favor, p?ngase en contacto con nuestra oficina llamando al [phone] y presione la opci?n 4 o env?enos un mensaje a trav?s de MyChart.  ? ?No podemos decirle cu?l ser? su copago por los medicamentos por adelantado ya que esto es diferente dependiendo de la cobertura de su seguro. Sin embargo, es posible que podamos encontrar un medicamento sustituto a menor costo o llenar un formulario para que el seguro cubra el medicamento que se considera necesario.  ? ?Si se requiere una autorizaci?n previa para que su compa??a de seguros cubra su medicamento, por favor perm?tanos de 1 a 2 d?as   h?biles para completar este proceso. ? ?Los precios de los medicamentos var?an con frecuencia dependiendo del Environmental consultant de d?nde se surte la receta y alguna farmacias pueden ofrecer precios m?s baratos. ? ?El sitio web www.goodrx.com tiene cupones para medicamentos de Airline pilot. Los precios aqu? no tienen en cuenta lo que podr?a costar con la ayuda del seguro (puede ser m?s barato con su seguro), pero el sitio web puede darle el precio si no utiliz? ning?n seguro.  ?- Puede imprimir el cup?n correspondiente y llevarlo con su receta a la farmacia.  ?- Tambi?n puede pasar por nuestra oficina durante el horario de atenci?n regular y recoger una tarjeta de cupones de GoodRx.  ?- Si necesita que su receta se env?e electr?nicamente a Chiropodist, informe a nuestra oficina a trav?s de MyChart de  o por tel?fono llamando al [phone] y presione la opci?n 4.  ?

## 2022-03-18 NOTE — Progress Notes (Signed)
? ?Follow-Up Visit ?  ?Subjective  ?Jennifer Mcgee is a 81 y.o. female who presents for the following: Annual Exam. Hx of SCC. Pt c/o growth on her right side of face changing and growing, she would like to have this area removed. ?The patient presents for Total-Body Skin Exam (TBSE) for skin cancer screening and mole check.  The patient has spots, moles and lesions to be evaluated, some may be new or changing and the patient has concerns that these could be cancer.  ? ?The following portions of the chart were reviewed this encounter and updated as appropriate:  ? Tobacco  Allergies  Meds  Problems  Med Hx  Surg Hx  Fam Hx   ?  ?Review of Systems:  No other skin or systemic complaints except as noted in HPI or Assessment and Plan. ? ?Objective  ?Well appearing patient in no apparent distress; mood and affect are within normal limits. ? ?A full examination was performed including scalp, head, eyes, ears, nose, lips, neck, chest, axillae, abdomen, back, buttocks, bilateral upper extremities, bilateral lower extremities, hands, feet, fingers, toes, fingernails, and toenails. All findings within normal limits unless otherwise noted below. ? ?face ?Mid face erythema with telangiectasias +/- scattered inflammatory papules.  ? ?right neck x 1 ?Stuck-on, waxy, tan-brown papule ? ?left face preauricular ?1.4 cm flesh irregular papule   ? ? ? ? ? ?Assessment & Plan  ?Rosacea ?face ?Rosacea is a chronic progressive skin condition usually affecting the face of adults, causing redness and/or acne bumps. It is treatable but not curable. It sometimes affects the eyes (ocular rosacea) as well. It may respond to topical and/or systemic medication and can flare with stress, sun exposure, alcohol, exercise and some foods.  Daily application of broad spectrum spf 30+ sunscreen to face is recommended to reduce flares.  ? ?Discussed the treatment option of BBL/laser.  Typically we recommend 1-3 treatment sessions about 5-8 weeks  apart for best results.  The patient's condition may require "maintenance treatments" in the future.  The fee for BBL / laser treatments is $350 per treatment session for the whole face.  A fee can be quoted for other parts of the body. ?Insurance typically does not pay for BBL/laser treatments and therefore the fee is an out-of-pocket cost.  ? ?Inflamed seborrheic keratosis ?right neck x 1 ?Irritated ISK  ? ?Reassured benign age-related growth.  Recommend observation.  Discussed cryotherapy if spot(s) become irritated or inflamed.  ? ?Destruction of lesion - right neck x 1 ?Complexity: simple   ?Destruction method: cryotherapy   ?Informed consent: discussed and consent obtained   ?Timeout:  patient name, date of birth, surgical site, and procedure verified ?Lesion destroyed using liquid nitrogen: Yes   ?Region frozen until ice ball extended beyond lesion: Yes   ?Outcome: patient tolerated procedure well with no complications   ?Post-procedure details: wound care instructions given   ? ?Neoplasm of skin ?left face preauricular ?Skin excision ? ?Total excision diameter (cm):  1.4 ?Informed consent: discussed and consent obtained   ?Timeout: patient name, date of birth, surgical site, and procedure verified   ?Procedure prep:  Patient was prepped and draped in usual sterile fashion ?Prep type:  Chlorhexidine ?Anesthesia: the lesion was anesthetized in a standard fashion   ?Anesthetic:  1% lidocaine w/ epinephrine 1-100,000 buffered w/ 8.4% NaHCO3 ?Hemostasis achieved with: pressure and electrodesiccation   ? ?Specimen 1 - Surgical pathology ?Differential Diagnosis: R/O Irritated nevus vs Dysplastic nevus  ?Check Margins: No ? ?  Skin cancer screening ? ?Lentigines ?- Scattered tan macules ?- Due to sun exposure ?- Benign-appearing, observe ?- Recommend daily broad spectrum sunscreen SPF 30+ to sun-exposed areas, reapply every 2 hours as needed. ?- Call for any changes ? ?Seborrheic Keratoses ?- Stuck-on, waxy, tan-brown  papules and/or plaques  ?- Benign-appearing ?- Discussed benign etiology and prognosis. ?- Observe ?- Call for any changes ? ?Melanocytic Nevi ?- Tan-brown and/or pink-flesh-colored symmetric macules and papules ?- Benign appearing on exam today ?- Observation ?- Call clinic for new or changing moles ?- Recommend daily use of broad spectrum spf 30+ sunscreen to sun-exposed areas.  ? ?Hemangiomas ?- Red papules ?- Discussed benign nature ?- Observe ?- Call for any changes ? ?Actinic Damage ?- Chronic condition, secondary to cumulative UV/sun exposure ?- diffuse scaly erythematous macules with underlying dyspigmentation ?- Recommend daily broad spectrum sunscreen SPF 30+ to sun-exposed areas, reapply every 2 hours as needed.  ?- Staying in the shade or wearing long sleeves, sun glasses (UVA+UVB protection) and wide brim hats (4-inch brim around the entire circumference of the hat) are also recommended for sun protection.  ?- Call for new or changing lesions. ? 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  ?- Call if any new or changing lesions are noted between office visits ? ?Skin cancer screening performed today.  ? ?Return in about 1 year (around 03/19/2023) for tbse, Hx of SCC. ? ?I, Marye Round, CMA, am acting as scribe for Sarina Ser, MD .  ?Documentation: I have reviewed the above documentation for accuracy and completeness, and I agree with the above. ? ?Sarina Ser, MD ? ?

## 2022-03-19 ENCOUNTER — Ambulatory Visit
Admission: RE | Admit: 2022-03-19 | Discharge: 2022-03-19 | Disposition: A | Discharge status: Home / Self Care | Payer: Medicare PPO | Source: Ambulatory Visit | Attending: Family Medicine | Admitting: Family Medicine

## 2022-03-19 DIAGNOSIS — Z1231 Encounter for screening mammogram for malignant neoplasm of breast: Secondary | ICD-10-CM | POA: Diagnosis not present

## 2022-03-23 ENCOUNTER — Telehealth: Payer: Self-pay

## 2022-03-23 ENCOUNTER — Encounter: Payer: Self-pay | Admitting: Dermatology

## 2022-03-23 NOTE — Telephone Encounter (Signed)
-----   Message from Ralene Bathe, MD sent at 03/22/2022  1:23 PM EDT ----- ?Diagnosis ?Skin , left face preauricular ?MELANOCYTIC NEVUS, INTRADERMAL TYPE, IRRITATED ? ?Benign irritated mole ?No further treatment needed ?

## 2022-03-23 NOTE — Telephone Encounter (Signed)
Left pt msg to call for bx results/sh 

## 2022-03-23 NOTE — Telephone Encounter (Signed)
Patient advised of BX results .aw 

## 2022-03-25 ENCOUNTER — Other Ambulatory Visit: Payer: Self-pay | Admitting: Family Medicine

## 2022-03-25 DIAGNOSIS — E119 Type 2 diabetes mellitus without complications: Secondary | ICD-10-CM

## 2022-03-29 ENCOUNTER — Other Ambulatory Visit: Payer: Self-pay | Admitting: Family Medicine

## 2022-03-29 DIAGNOSIS — E119 Type 2 diabetes mellitus without complications: Secondary | ICD-10-CM

## 2022-04-10 ENCOUNTER — Other Ambulatory Visit: Payer: Self-pay | Admitting: Family Medicine

## 2022-04-10 DIAGNOSIS — I10 Essential (primary) hypertension: Secondary | ICD-10-CM

## 2022-04-12 NOTE — Telephone Encounter (Signed)
Requested Prescriptions  ?Pending Prescriptions Disp Refills  ?? hydrochlorothiazide (MICROZIDE) 12.5 MG capsule [Pharmacy Med Name: HYDROCHLOROTHIAZIDE 12.5 MG CP] 90 capsule 1  ?  Sig: TAKE 1 CAPSULE BY MOUTH EVERY DAY  ?  ? Cardiovascular: Diuretics - Thiazide Failed - 04/10/2022  1:25 AM  ?  ?  Failed - Cr in normal range and within 180 days  ?  Creat  ?Date Value Ref Range Status  ?08/31/2017 0.94 (H) 0.60 - 0.93 mg/dL Final  ?  Comment:  ?  For patients >77 years of age, the reference limit ?for Creatinine is approximately 13% higher for people ?identified as African-American. ?. ?  ? ?Creatinine, Ser  ?Date Value Ref Range Status  ?02/19/2021 0.86 0.57 - 1.00 mg/dL Final  ?   ?  ?  Failed - K in normal range and within 180 days  ?  Potassium  ?Date Value Ref Range Status  ?02/19/2021 5.1 3.5 - 5.2 mmol/L Final  ?   ?  ?  Failed - Na in normal range and within 180 days  ?  Sodium  ?Date Value Ref Range Status  ?02/19/2021 146 (H) 134 - 144 mmol/L Final  ?   ?  ?  Passed - Last BP in normal range  ?  BP Readings from Last 1 Encounters:  ?01/06/22 101/72  ?   ?  ?  Passed - Valid encounter within last 6 months  ?  Recent Outpatient Visits   ?      ? 3 months ago Type 2 diabetes mellitus without complication, without long-term current use of insulin ([place])  ? [facility] Jerrol Banana., MD  ? 6 months ago Non-recurrent acute suppurative otitis media of left ear without spontaneous rupture of tympanic membrane  ? [facility] Thedore Mins, Ria Comment, PA-C  ? 7 months ago Encounter for annual wellness exam in Medicare patient  ? [facility] Jerrol Banana., MD  ? 1 year ago Type 2 diabetes mellitus without complication, without long-term current use of insulin ([place])  ? [facility] Jerrol Banana., MD  ? 1 year ago Type 2 diabetes mellitus without complication, without long-term current use of insulin ([place])  ? [facility] Jerrol Banana., MD  ?  ?  ?Future Appointments   ?        ? In 4 weeks Jerrol Banana., MD [facility]  ? In 11 months Ralene Bathe, MD [place]  ?  ? ?  ?  ?  ? ? ?

## 2022-05-10 ENCOUNTER — Encounter: Payer: Self-pay | Admitting: Family Medicine

## 2022-05-10 ENCOUNTER — Ambulatory Visit (INDEPENDENT_AMBULATORY_CARE_PROVIDER_SITE_OTHER): Payer: Medicare PPO | Admitting: Family Medicine

## 2022-05-10 VITALS — BP 149/65 | HR 97 | Temp 99.2°F | Resp 16 | Ht 63.0 in | Wt 195.0 lb

## 2022-05-10 DIAGNOSIS — E119 Type 2 diabetes mellitus without complications: Secondary | ICD-10-CM

## 2022-05-10 DIAGNOSIS — F4321 Adjustment disorder with depressed mood: Secondary | ICD-10-CM | POA: Diagnosis not present

## 2022-05-10 DIAGNOSIS — M1712 Unilateral primary osteoarthritis, left knee: Secondary | ICD-10-CM | POA: Diagnosis not present

## 2022-05-10 DIAGNOSIS — I10 Essential (primary) hypertension: Secondary | ICD-10-CM

## 2022-05-10 LAB — POCT GLYCOSYLATED HEMOGLOBIN (HGB A1C)
Est. average glucose Bld gHb Est-mCnc: 148
Hemoglobin A1C: 6.8 % — AB (ref 4.0–5.6)

## 2022-05-10 NOTE — Patient Instructions (Signed)
Check blood pressures at home.

## 2022-05-10 NOTE — Progress Notes (Addendum)
I,Tiffany J Bragg,acting as a scribe for Wilhemena Durie, MD.,have documented all relevant documentation on the behalf of Wilhemena Durie, MD,as directed by  Wilhemena Durie, MD while in the presence of Wilhemena Durie, MD.   Established patient visit   Patient: Jennifer Mcgee   DOB: [birth year]   80 y.o. Female  MRN: [id number] Visit Date: 05/10/2022  Today's healthcare provider: Wilhemena Durie, MD   Chief Complaint  Patient presents with   Diabetes   Subjective    HPI  Patient comes in today for routine follow-up.  She has occasional blood sugar in the 80s but no symptomatic hypoglycemia. He is quite tearful today as there is family stresses that he and her husband tried to help the granddaughter now her daughter would not speak to them.  She  is quite tearful during the exam today.  Diabetes Mellitus Type II, Follow-up  Lab Results  Component Value Date   HGBA1C 7.3 (H) 01/06/2022   HGBA1C 6.8 (A) 08/26/2021   HGBA1C 6.8 (A) 02/19/2021   Wt Readings from Last 3 Encounters:  05/10/22 195 lb (88.5 kg)  01/06/22 200 lb (90.7 kg)  09/30/21 206 lb 8 oz (93.7 kg)   Last seen for diabetes 4 months ago.  Management since then includes continue medication. She reports excellent compliance with treatment. She is not having side effects.  Symptoms: No fatigue No foot ulcerations  No appetite changes No nausea  No paresthesia of the feet  No polydipsia  No polyuria No visual disturbances   No vomiting     Home blood sugar records: fasting range: around 100  Episodes of hypoglycemia? No     Most Recent Eye Exam: January. Has another appt in July. Current exercise: walking Current diet habits: general diet  Pertinent Labs: Lab Results  Component Value Date   CHOL 179 02/19/2021   HDL 62 02/19/2021   LDLCALC 97 02/19/2021   TRIG 111 02/19/2021   CHOLHDL 2.9 02/19/2021   Lab Results  Component Value Date   NA 146 (H) 02/19/2021   K 5.1  02/19/2021   CREATININE 0.86 02/19/2021   EGFR 69 02/19/2021   MICROALBUR 20 01/29/2019     ---------------------------------------------------------------------------------------------------   Medications: Outpatient Medications Prior to Visit  Medication Sig   Accu-Chek FastClix Lancets MISC Check blood glucose once daily   ACCU-CHEK SMARTVIEW test strip USE WITH METER TO CHECK GLUCOSE DAILY   atorvastatin (LIPITOR) 20 MG tablet TAKE 1 TABLET BY MOUTH EVERYDAY AT BEDTIME   ferrous sulfate 325 (65 FE) MG tablet Take 325 mg by mouth daily with breakfast.    glipiZIDE (GLUCOTROL XL) 2.5 MG 24 hr tablet Take 1 tablet by mouth once daily   hydrochlorothiazide (MICROZIDE) 12.5 MG capsule TAKE 1 CAPSULE BY MOUTH EVERY DAY   lisinopril (ZESTRIL) 40 MG tablet Take 1 tablet (40 mg total) by mouth daily.   magnesium gluconate (MAGONATE) 500 MG tablet Take 500 mg by mouth daily.    metFORMIN (GLUCOPHAGE) 1000 MG tablet TAKE 1 TABLET (1,000 MG TOTAL) BY MOUTH 2 (TWO) TIMES DAILY WITH A MEAL.   Multiple Vitamin (MULTIVITAMIN) tablet Take 1 tablet by mouth daily.   Multiple Vitamins-Minerals (PRESERVISION AREDS 2 PO) Take 1 capsule by mouth 2 (two) times daily.   vitamin B-12 (CYANOCOBALAMIN) 1000 MCG tablet Take 1,000 mcg by mouth daily.   No facility-administered medications prior to visit.    Review of Systems  Last hemoglobin A1c Lab Results  Component Value Date   HGBA1C 6.8 (A) 05/10/2022       Objective    BP (!) 149/65 (BP Location: Left Arm, Patient Position: Sitting, Cuff Size: Normal)   Pulse 97   Temp 99.2 F (37.3 C) (Oral)   Resp 16   Ht 5' 3"  (1.6 m)   Wt 195 lb (88.5 kg)   SpO2 99%   BMI 34.54 kg/m  BP Readings from Last 3 Encounters:  05/10/22 (!) 149/65  01/06/22 101/72  09/30/21 122/88   Wt Readings from Last 3 Encounters:  05/10/22 195 lb (88.5 kg)  01/06/22 200 lb (90.7 kg)  09/30/21 206 lb 8 oz (93.7 kg)      Physical Exam Vitals reviewed.   Constitutional:      Appearance: She is well-developed. She is obese.  HENT:     Head: Normocephalic and atraumatic.     Right Ear: Tympanic membrane and external ear normal.     Left Ear: Tympanic membrane and external ear normal.     Nose: Nose normal.     Mouth/Throat:     Mouth: Mucous membranes are moist.     Pharynx: Oropharynx is clear.  Eyes:     General: No scleral icterus.    Extraocular Movements: Extraocular movements intact.     Conjunctiva/sclera: Conjunctivae normal.     Pupils: Pupils are equal, round, and reactive to light.  Neck:     Thyroid: No thyromegaly.  Cardiovascular:     Rate and Rhythm: Normal rate and regular rhythm.  Pulmonary:     Effort: Pulmonary effort is normal.     Breath sounds: Normal breath sounds.  Abdominal:     Palpations: Abdomen is soft.  Musculoskeletal:        General: Normal range of motion.     Cervical back: Normal range of motion and neck supple.     Comments: Trace lower extremity edema  Skin:    General: Skin is warm and dry.  Neurological:     General: No focal deficit present.     Mental Status: She is alert and oriented to person, place, and time. Mental status is at baseline.     Deep Tendon Reflexes: Reflexes are normal and symmetric.  Psychiatric:        Mood and Affect: Mood normal.        Behavior: Behavior normal.        Thought Content: Thought content normal.        Judgment: Judgment normal.       No results found for any visits on 05/10/22.  Assessment & Plan     1. Type 2 diabetes mellitus without complication, without long-term current use of insulin (HCC) A1C is 6.8.More than 50% 25 minute visit spent in counseling or coordination of care  May choose to stop glipizide in the future.  - POCT glycosylated hemoglobin (Hb A1C)  2. Primary hypertension Home blood pressures are good.  She will bring those in on next visit  3. Primary osteoarthritis of left knee   4. Severe obesity (BMI  35.0-39.9) with comorbidity ([place]) And exercise would help all problems.  5. Adjustment disorder with depressed mood No medications needed.  Counseling might be helpful and I think she is getting this through her church.   No follow-ups on file.      I, Wilhemena Durie, MD, have reviewed all documentation for this visit. The documentation on 05/15/22 for the exam, diagnosis, procedures, and orders are  all accurate and complete.    Allanna Bresee Cranford Mon, MD  [facility] [phone] (phone) [phone] (fax)  [place]

## 2022-06-05 ENCOUNTER — Other Ambulatory Visit: Payer: Self-pay | Admitting: Family Medicine

## 2022-06-05 DIAGNOSIS — E119 Type 2 diabetes mellitus without complications: Secondary | ICD-10-CM

## 2022-06-17 DIAGNOSIS — H35342 Macular cyst, hole, or pseudohole, left eye: Secondary | ICD-10-CM | POA: Diagnosis not present

## 2022-06-17 DIAGNOSIS — H35371 Puckering of macula, right eye: Secondary | ICD-10-CM | POA: Diagnosis not present

## 2022-06-17 DIAGNOSIS — H353112 Nonexudative age-related macular degeneration, right eye, intermediate dry stage: Secondary | ICD-10-CM | POA: Diagnosis not present

## 2022-06-17 DIAGNOSIS — H353124 Nonexudative age-related macular degeneration, left eye, advanced atrophic with subfoveal involvement: Secondary | ICD-10-CM | POA: Diagnosis not present

## 2022-06-17 DIAGNOSIS — E119 Type 2 diabetes mellitus without complications: Secondary | ICD-10-CM | POA: Diagnosis not present

## 2022-06-17 DIAGNOSIS — H43811 Vitreous degeneration, right eye: Secondary | ICD-10-CM | POA: Diagnosis not present

## 2022-06-24 IMAGING — MG MM DIGITAL SCREENING BILAT W/ TOMO AND CAD
8 series · 8 of 24 positions shown · non-contrast
Comparison: Previous exam(s).

CLINICAL DATA: Screening.

EXAM:
DIGITAL SCREENING BILATERAL MAMMOGRAM WITH TOMOSYNTHESIS AND CAD
TECHNIQUE: Bilateral screening digital craniocaudal and mediolateral oblique
mammograms were obtained. Bilateral screening digital breast
tomosynthesis was performed. The images were evaluated with
computer-aided detection.

[R CC synth-2D]
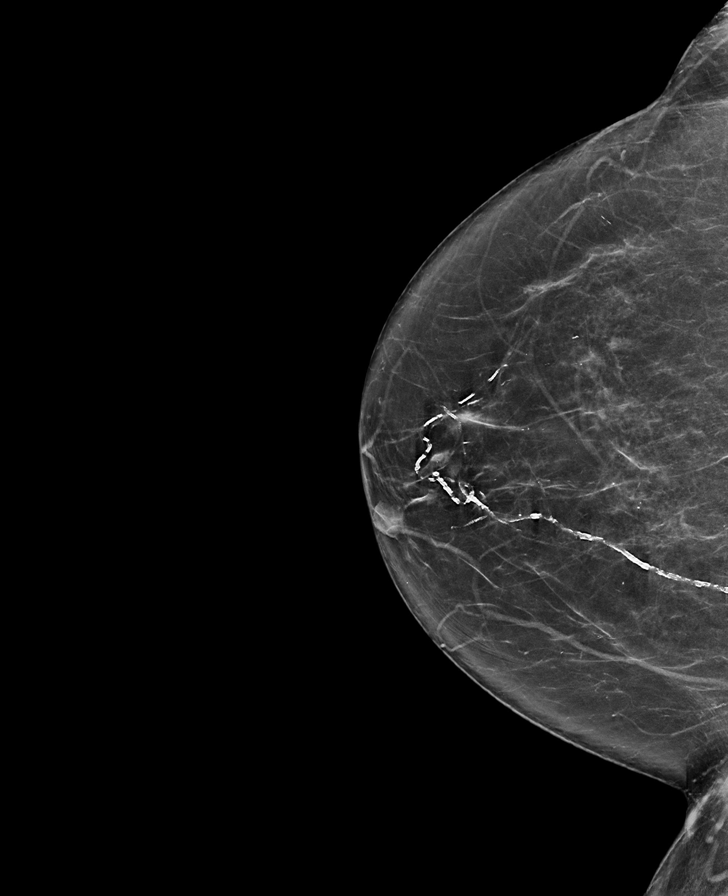

[L MLO synth-2D]
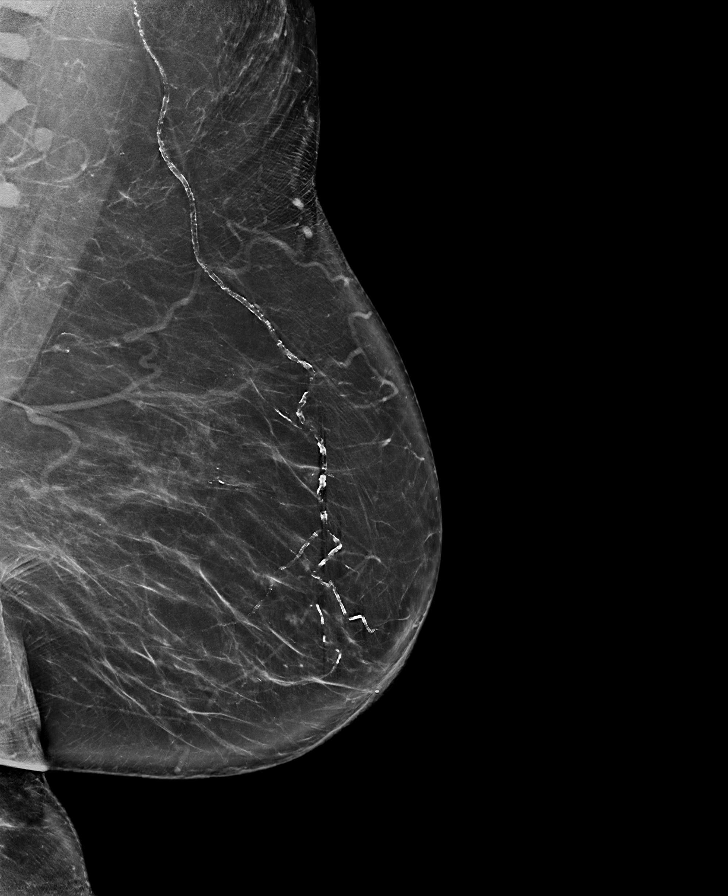

[R MLO synth-2D]
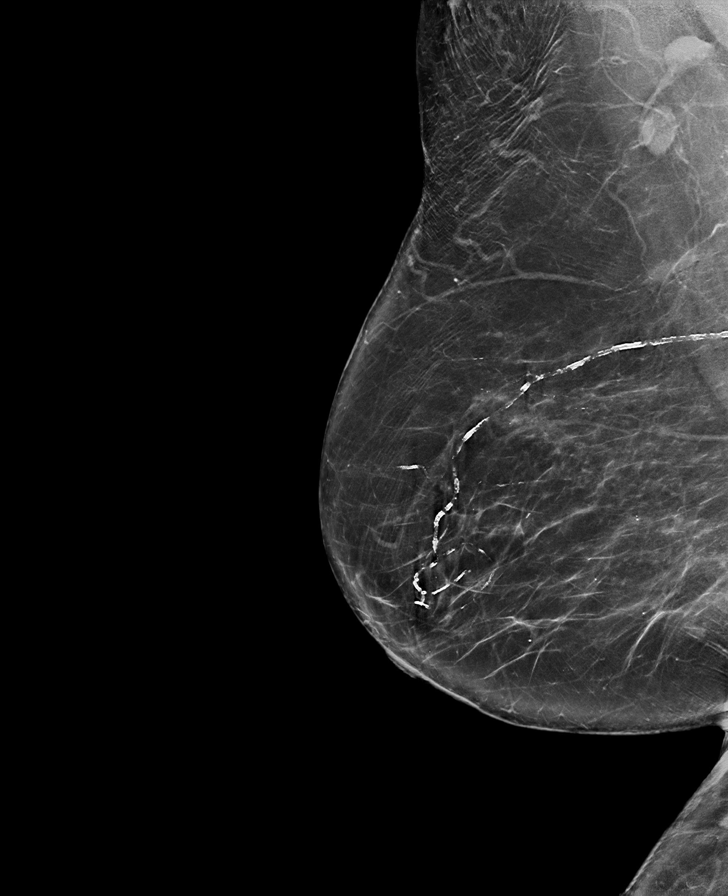

[L CC synth-2D]
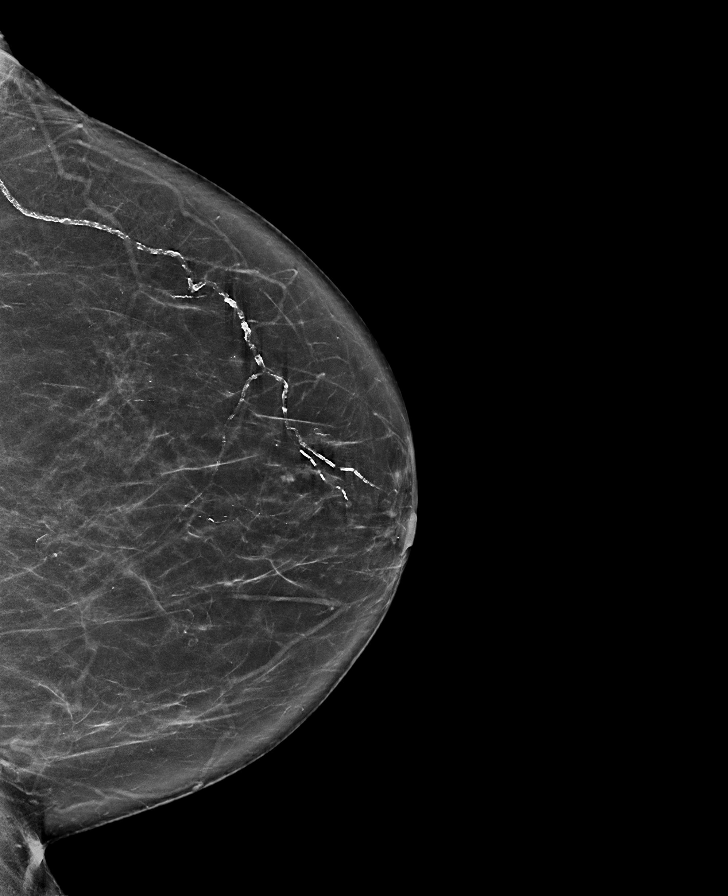

[R CC tomo · tomo slice 35/70.0]
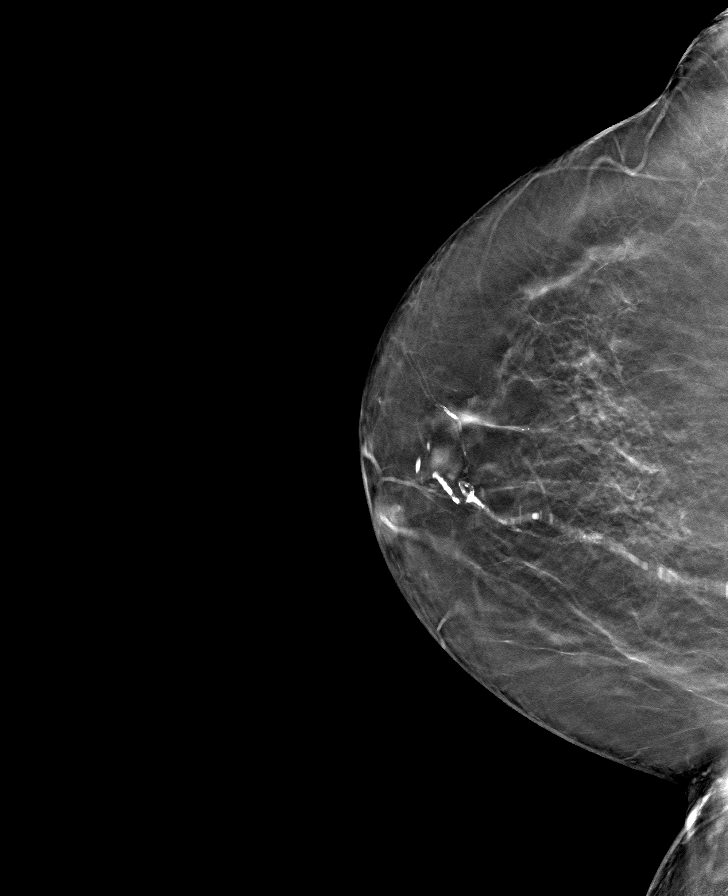

[L CC tomo · tomo slice 36/71.0]
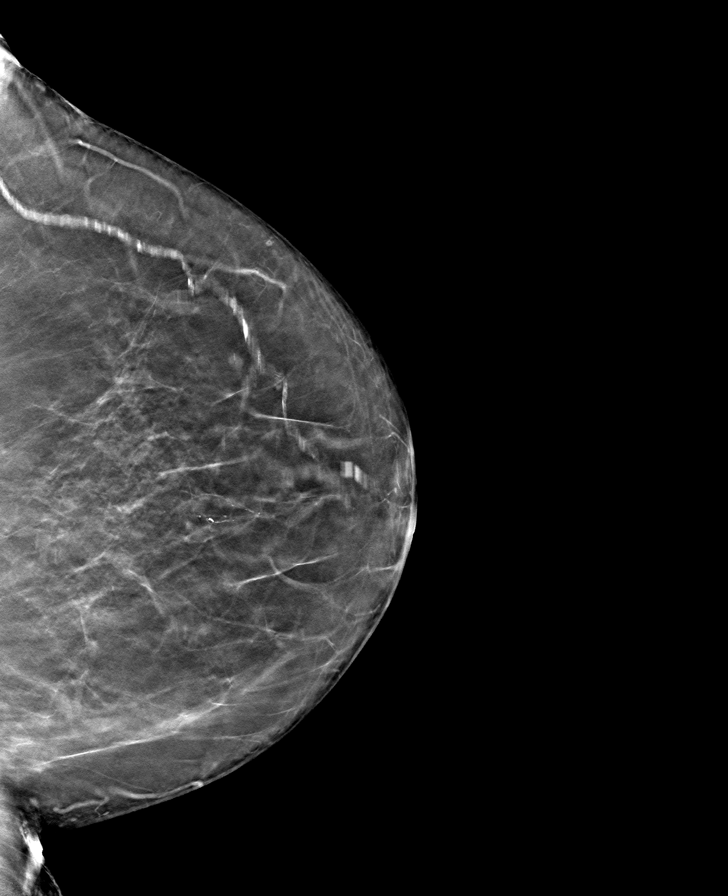

[R MLO tomo · tomo slice 41/82.0]
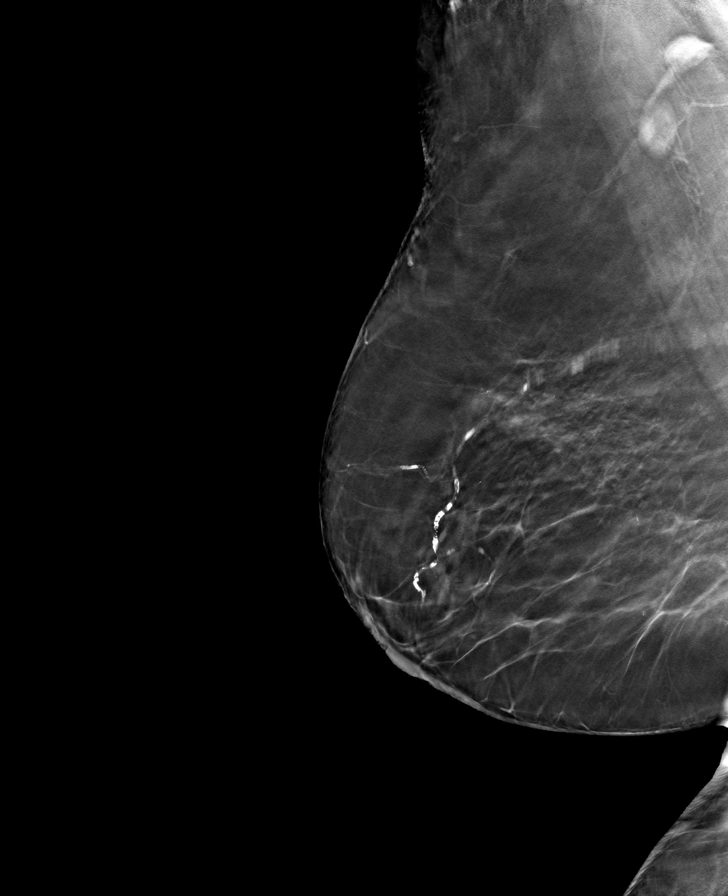

[L MLO tomo · tomo slice 43/85.0]
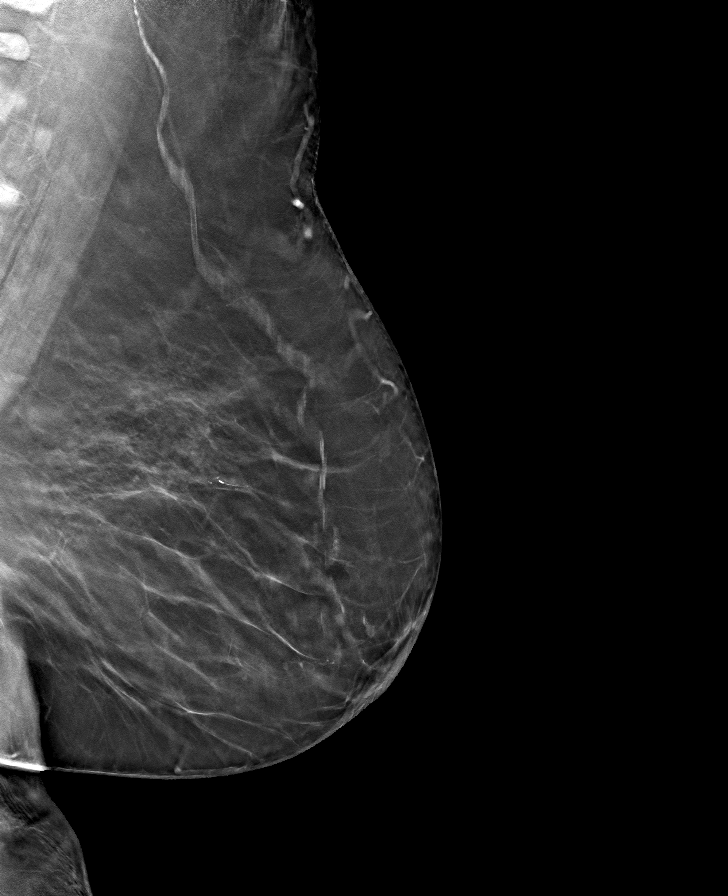

[8 of 24 positions shown; findings below may reference images not displayed]

ACR Breast Density Category b: There are scattered areas of
fibroglandular density.
FINDINGS: There are no findings suspicious for malignancy. The images were
evaluated with computer-aided detection.
IMPRESSION: No mammographic evidence of malignancy. A result letter of this
screening mammogram will be mailed directly to the patient.

RECOMMENDATION:
Screening mammogram in one year. (Code:[id number])

BI-RADS CATEGORY  1: Negative.

## 2022-07-15 DIAGNOSIS — Z96652 Presence of left artificial knee joint: Secondary | ICD-10-CM | POA: Diagnosis not present

## 2022-08-13 ENCOUNTER — Other Ambulatory Visit: Payer: Self-pay | Admitting: Family Medicine

## 2022-08-13 DIAGNOSIS — E7800 Pure hypercholesterolemia, unspecified: Secondary | ICD-10-CM

## 2022-08-20 ENCOUNTER — Other Ambulatory Visit: Payer: Self-pay | Admitting: Physician Assistant

## 2022-08-20 DIAGNOSIS — I10 Essential (primary) hypertension: Secondary | ICD-10-CM

## 2022-08-25 ENCOUNTER — Other Ambulatory Visit: Payer: Self-pay | Admitting: Family Medicine

## 2022-08-25 DIAGNOSIS — E119 Type 2 diabetes mellitus without complications: Secondary | ICD-10-CM

## 2022-08-25 NOTE — Telephone Encounter (Signed)
Medication Refill - Medication: Accu-Chek FastClix Lancets MISC, ACCU-CHEK SMARTVIEW test strip (advised pt she has refills however, pt stated pharmacy said it was inactive and she needed to contact PCP)   Has the patient contacted their pharmacy? Yes.    (Agent: If yes, when and what did the pharmacy advise?)  Preferred Pharmacy (with phone number or street name):  [id number] [place] [id number] Phone: [phone]: [phone] Hours: Not open 24 hours   Has the patient been seen for an appointment in the last year OR does the patient have an upcoming appointment? Yes.    Agent: Please be advised that RX refills may take up to 3 business days. We ask that you follow-up with your pharmacy.

## 2022-08-25 NOTE — Telephone Encounter (Signed)
Requested medication (s) are due for refill today:   Yes  Requested medication (s) are on the active medication list:   Yes  Future visit scheduled:   Yes   Last ordered: Lancets 08/26/2021 #102, 3 refills,  Test strips 06/05/2022 #100, 6 refills  Returned because new rx needed for both supplies.     Requested Prescriptions  Pending Prescriptions Disp Refills   Accu-Chek FastClix Lancets MISC 102 each 3    Sig: Check blood glucose once daily     Endocrinology: Diabetes - Testing Supplies Passed - 08/25/2022 11:31 AM      Passed - Valid encounter within last 12 months    Recent Outpatient Visits           3 months ago Type 2 diabetes mellitus without complication, without long-term current use of insulin ([place])   [facility] Jerrol Banana., MD   7 months ago Type 2 diabetes mellitus without complication, without long-term current use of insulin ([place])   [facility] Jerrol Banana., MD   10 months ago Non-recurrent acute suppurative otitis media of left ear without spontaneous rupture of tympanic membrane   [facility] Thedore Mins, [place], PA-C   12 months ago Encounter for annual wellness exam in Medicare patient   [place], Retia Passe., MD   1 year ago Type 2 diabetes mellitus without complication, without long-term current use of insulin [facility])   [facility] Jerrol Banana., MD       Future Appointments             In 1 month Jerrol Banana., MD [facility]   In 7 months Ralene Bathe, MD [place]             glucose blood (ACCU-CHEK SMARTVIEW) test strip 100 strip 6    Sig: Use as instructed     Endocrinology: Diabetes - Testing Supplies Passed - 08/25/2022 11:31 AM      Passed - Valid encounter within last 12 months    Recent Outpatient Visits           3 months ago Type 2 diabetes mellitus without complication, without  long-term current use of insulin [facility])   [facility] Jerrol Banana., MD   7 months ago Type 2 diabetes mellitus without complication, without long-term current use of insulin [facility])   [facility] Jerrol Banana., MD   10 months ago Non-recurrent acute suppurative otitis media of left ear without spontaneous rupture of tympanic membrane   [facility] Thedore Mins, [place], PA-C   12 months ago Encounter for annual wellness exam in Medicare patient   [place], Retia Passe., MD   1 year ago Type 2 diabetes mellitus without complication, without long-term current use of insulin [facility])   [facility] Jerrol Banana., MD       Future Appointments             In 1 month Jerrol Banana., MD [facility] [place]   In 7 months Ralene Bathe, MD [place]

## 2022-08-26 MED ORDER — ACCU-CHEK SMARTVIEW VI STRP: ORAL_STRIP | 6 refills | Status: AC

## 2022-08-26 MED ORDER — ACCU-CHEK FASTCLIX LANCETS MISC: 3 refills | Status: AC

## 2022-08-31 ENCOUNTER — Encounter: Payer: Self-pay | Admitting: Family Medicine

## 2022-08-31 ENCOUNTER — Ambulatory Visit (INDEPENDENT_AMBULATORY_CARE_PROVIDER_SITE_OTHER): Payer: Medicare PPO | Admitting: Family Medicine

## 2022-08-31 ENCOUNTER — Ambulatory Visit: Payer: Self-pay | Admitting: *Deleted

## 2022-08-31 VITALS — BP 150/68 | HR 89 | Temp 98.2°F | Resp 16 | Ht 62.0 in | Wt 196.4 lb

## 2022-08-31 DIAGNOSIS — R1032 Left lower quadrant pain: Secondary | ICD-10-CM | POA: Diagnosis not present

## 2022-08-31 LAB — POCT URINALYSIS DIPSTICK
Bilirubin, UA: NEGATIVE
Blood, UA: NEGATIVE
Glucose, UA: NEGATIVE
Ketones, UA: NEGATIVE
Leukocytes, UA: NEGATIVE
Nitrite, UA: NEGATIVE
Protein, UA: NEGATIVE
Spec Grav, UA: 1.01 (ref 1.010–1.025)
Urobilinogen, UA: 0.2 E.U./dL
pH, UA: 6 (ref 5.0–8.0)

## 2022-08-31 NOTE — Telephone Encounter (Signed)
Reason for Disposition  [1] MODERATE pain (e.g., interferes with normal activities) AND [2] pain comes and goes (cramps) AND [3] present > 24 hours  (Exception: Pain with Vomiting or Diarrhea - see that Guideline.)  Answer Assessment - Initial Assessment Questions 1. LOCATION: "Where does it hurt?"      Left upper abd pain.   Belly button to left side 2. RADIATION: "Does the pain shoot anywhere else?" (e.g., chest, back)     Left side 3. ONSET: "When did the pain begin?" (e.g., minutes, hours or days ago)      It didn't feel good Sunday.   Pain started yesterday. 4. SUDDEN: "Gradual or sudden onset?"     *No Answer* 5. PATTERN "Does the pain come and go, or is it constant?"    - If it comes and goes: "How long does it last?" "Do you have pain now?"     (Note: Comes and goes means the pain is intermittent. It goes away completely between bouts.)    - If constant: "Is it getting better, staying the same, or getting worse?"      (Note: Constant means the pain never goes away completely; most serious pain is constant and gets worse.)      Constant   Hurts to bend over and when I press on the area  I have diverticulitis 6. SEVERITY: "How bad is the pain?"  (e.g., Scale 1-10; mild, moderate, or severe)    - MILD (1-3): Doesn't interfere with normal activities, abdomen soft and not tender to touch.     - MODERATE (4-7): Interferes with normal activities or awakens from sleep, abdomen tender to touch.     - SEVERE (8-10): Excruciating pain, doubled over, unable to do any normal activities.       moderate 7. RECURRENT SYMPTOM: "Have you ever had this type of stomach pain before?" If Yes, ask: "When was the last time?" and "What happened that time?"      Yes on other side 8. CAUSE: "What do you think is causing the stomach pain?"     I'm not sure 9. RELIEVING/AGGRAVATING FACTORS: "What makes it better or worse?" (e.g., antacids, bending or twisting motion, bowel movement)     No diarrhea or  vomiting. 10. OTHER SYMPTOMS: "Do you have any other symptoms?" (e.g., back pain, diarrhea, fever, urination pain, vomiting)       No diarrhea, vomiting 11. PREGNANCY: "Is there any chance you are pregnant?" "When was your last menstrual period?"       N/A due to age  Protocols used: Abdominal Pain - [facility]

## 2022-08-31 NOTE — Telephone Encounter (Signed)
  Chief Complaint: Left upper abd pain radiating into left side Symptoms: above Frequency: constant.  Started Sunday but pain became worse yesterday Pertinent Negatives: Patient denies vomiting or diarrhea Disposition: '[]'$ ED /'[]'$ Urgent Care (no appt availability in office) / '[x]'$ Appointment(In office/virtual)/ '[]'$  [facility]/ '[]'$ Home Care/ '[]'$ Refused Recommended Disposition /'[]'$ [facility]/ '[]'$  Follow-up with PCP Additional Notes: Appt made for today with Dr. Alba Cory at 2:00.

## 2022-08-31 NOTE — Progress Notes (Signed)
Established patient visit  I,Joseline E Rosas,acting as a scribe for [employer], MD.,have documented all relevant documentation on the behalf of Eulis Foster, MD,as directed by  Eulis Foster, MD while in the presence of Eulis Foster, MD.   Patient: Jennifer Mcgee   DOB: 12-19-1940   81 y.o. Female  MRN: [id number] Visit Date: 08/31/2022  Today's healthcare provider: Eulis Foster, MD   Chief Complaint  Patient presents with   Abdominal Pain   Subjective    She adds that she was picking beans form a garden last week and spent a long time bending over and pain is reproduced with bending over sometimes   Abdominal Pain This is a new problem. The current episode started in the past 7 days (Discomfort since Sunday until yesterdya it became painful.). The onset quality is sudden. The problem occurs constantly. The problem has been gradually worsening. The pain is located in the LUQ (Mid-section). The pain is at a severity of 5/10. The pain is mild. The quality of the pain is sharp. The abdominal pain radiates to the back. Associated symptoms include flatus. Pertinent negatives include no belching, constipation, diarrhea, dysuria, hematochezia, hematuria, nausea or vomiting. Associated symptoms comments: Discomfort is not related to food intake  . The pain is aggravated by movement (Bending). The pain is relieved by Nothing. She has tried nothing for the symptoms. diverticulosis     Medications: Outpatient Medications Prior to Visit  Medication Sig   Accu-Chek FastClix Lancets MISC Check blood glucose once daily   atorvastatin (LIPITOR) 20 MG tablet TAKE 1 TABLET BY MOUTH EVERYDAY AT BEDTIME   ferrous sulfate 325 (65 FE) MG tablet Take 325 mg by mouth daily with breakfast.    glipiZIDE (GLUCOTROL XL) 2.5 MG 24 hr tablet Take 1 tablet by mouth once daily   glucose blood (ACCU-CHEK SMARTVIEW) test strip USE WITH METER TO CHECK  GLUCOSE DAILY   hydrochlorothiazide (MICROZIDE) 12.5 MG capsule TAKE 1 CAPSULE BY MOUTH EVERY DAY   lisinopril (ZESTRIL) 40 MG tablet TAKE 1 TABLET BY MOUTH EVERY DAY   magnesium gluconate (MAGONATE) 500 MG tablet Take 500 mg by mouth daily.    metFORMIN (GLUCOPHAGE) 1000 MG tablet TAKE 1 TABLET (1,000 MG TOTAL) BY MOUTH 2 (TWO) TIMES DAILY WITH A MEAL.   Multiple Vitamin (MULTIVITAMIN) tablet Take 1 tablet by mouth daily.   Multiple Vitamins-Minerals (PRESERVISION AREDS 2 PO) Take 1 capsule by mouth 2 (two) times daily.   vitamin B-12 (CYANOCOBALAMIN) 1000 MCG tablet Take 1,000 mcg by mouth daily.   No facility-administered medications prior to visit.    Review of Systems  Gastrointestinal:  Positive for abdominal pain and flatus. Negative for constipation, diarrhea, hematochezia, nausea and vomiting.  Genitourinary:  Negative for dysuria and hematuria.       Objective    BP (!) 150/68 (BP Location: Right Arm, Patient Position: Sitting, Cuff Size: Large)   Pulse 89   Temp 98.2 F (36.8 C) (Oral)   Resp 16   Ht '5\' 2"'$  (1.575 m)   Wt 196 lb 6.4 oz (89.1 kg)   BMI 35.92 kg/m    Physical Exam Cardiovascular:     Rate and Rhythm: Normal rate and regular rhythm.  Pulmonary:     Effort: Pulmonary effort is normal. No respiratory distress.     Breath sounds: Normal breath sounds. No wheezing.  Abdominal:     General: Bowel sounds are normal. There is no distension. There are no signs  of injury.     Palpations: Abdomen is soft. There is no shifting dullness, hepatomegaly, mass or pulsatile mass.     Tenderness: There is abdominal tenderness in the left lower quadrant. There is no right CVA tenderness, left CVA tenderness, guarding or rebound. Negative signs include Murphy's sign, Rovsing's sign and McBurney's sign.         Results for orders placed or performed in visit on 08/31/22  POCT urinalysis dipstick  Result Value Ref Range   Color, UA Yellow    Clarity, UA Clear     Glucose, UA Negative Negative   Bilirubin, UA Negative    Ketones, UA Negative    Spec Grav, UA 1.010 1.010 - 1.025   Blood, UA Negative    pH, UA 6.0 5.0 - 8.0   Protein, UA Negative Negative   Urobilinogen, UA 0.2 0.2 or 1.0 E.U./dL   Nitrite, UA Negative    Leukocytes, UA Negative Negative   Appearance     Odor      Assessment & Plan     Problem List Items Addressed This Visit       Other   Left lower quadrant abdominal pain - Primary    Acute problem Pain appears to be different than her diverticulitis pain but states she has not had much pain with the diverticulitis diagnosed in the past  She has not had fever, chills or emesis  DDX includes early diverticulitis, MSK strain, constipation (although she reports regular bowel movements) or nephrolithiasis.  U/A showed no abnormalities  Recommended use of tylenol for pain for now as we await results of creatinine  Recommended Tylenol for now and as long as creatinine is normal, she can briefly use NSAIDs  Will check CMP and CBC today Recommended using heat therapy to back and abdomen if related to gas vs MSK this will help symptoms  F/u in 2 weeks or sooner if pain worsens       Relevant Orders   POCT urinalysis dipstick (Completed)   CBC   Comprehensive metabolic panel     Return in about 2 weeks (around 09/14/2022) for abdominal pain .      I, Eulis Foster, MD, have reviewed all documentation for this visit. The documentation on 08/31/22 for the exam, diagnosis, procedures, and orders are all accurate and complete.    Eulis Foster, MD  [facility] [phone] (phone) [phone] (fax)  [place]

## 2022-08-31 NOTE — Assessment & Plan Note (Addendum)
Acute problem Pain appears to be different than her diverticulitis pain but states she has not had much pain with the diverticulitis diagnosed in the past  She has not had fever, chills or emesis  DDX includes early diverticulitis, MSK strain, constipation (although she reports regular bowel movements) or nephrolithiasis.  U/A showed no abnormalities  Recommended use of tylenol for pain for now as we await results of creatinine  Recommended Tylenol for now and as long as creatinine is normal, she can briefly use NSAIDs  Will check CMP and CBC today Recommended using heat therapy to back and abdomen if related to gas vs MSK this will help symptoms  F/u in 2 weeks or sooner if pain worsens

## 2022-09-01 LAB — COMPREHENSIVE METABOLIC PANEL
ALT: 17 IU/L (ref 0–32)
AST: 16 IU/L (ref 0–40)
Albumin/Globulin Ratio: 1.8 (ref 1.2–2.2)
Albumin: 4.3 g/dL (ref 3.8–4.8)
Alkaline Phosphatase: 85 IU/L (ref 44–121)
BUN/Creatinine Ratio: 20 (ref 12–28)
BUN: 17 mg/dL (ref 8–27)
Bilirubin Total: 0.6 mg/dL (ref 0.0–1.2)
CO2: 23 mmol/L (ref 20–29)
Calcium: 9.7 mg/dL (ref 8.7–10.3)
Chloride: 101 mmol/L (ref 96–106)
Creatinine, Ser: 0.86 mg/dL (ref 0.57–1.00)
Globulin, Total: 2.4 g/dL (ref 1.5–4.5)
Glucose: 118 mg/dL — ABNORMAL HIGH (ref 70–99)
Potassium: 4.3 mmol/L (ref 3.5–5.2)
Sodium: 145 mmol/L — ABNORMAL HIGH (ref 134–144)
Total Protein: 6.7 g/dL (ref 6.0–8.5)
eGFR: 68 mL/min/{1.73_m2} (ref >59)

## 2022-09-01 LAB — CBC
Hematocrit: 33.3 % — ABNORMAL LOW (ref 34.0–46.6)
Hemoglobin: 11.1 g/dL (ref 11.1–15.9)
MCH: 28.7 pg (ref 26.6–33.0)
MCHC: 33.3 g/dL (ref 31.5–35.7)
MCV: 86 fL (ref 79–97)
Platelets: 394 10*3/uL (ref 150–450)
RBC: 3.87 x10E6/uL (ref 3.77–5.28)
RDW: 12.7 % (ref 11.7–15.4)
WBC: 9.3 10*3/uL (ref 3.4–10.8)

## 2022-09-06 DIAGNOSIS — E119 Type 2 diabetes mellitus without complications: Secondary | ICD-10-CM | POA: Diagnosis not present

## 2022-09-21 ENCOUNTER — Other Ambulatory Visit: Payer: Self-pay | Admitting: Family Medicine

## 2022-09-21 DIAGNOSIS — E119 Type 2 diabetes mellitus without complications: Secondary | ICD-10-CM

## 2022-09-21 NOTE — Telephone Encounter (Signed)
Requested Prescriptions  Pending Prescriptions Disp Refills  . metFORMIN (GLUCOPHAGE) 1000 MG tablet [Pharmacy Med Name: METFORMIN HCL 1,000 MG TABLET] 120 tablet 0    Sig: TAKE 1 TABLET (1,000 MG TOTAL) BY MOUTH TWICE A DAY WITH FOOD     Endocrinology:  Diabetes - Biguanides Failed - 09/21/2022  1:22 AM      Failed - B12 Level in normal range and within 720 days    No results found for: "VITAMINB12"       Failed - CBC within normal limits and completed in the last 12 months    WBC  Date Value Ref Range Status  08/31/2022 9.3 3.4 - 10.8 x10E3/uL Final  06/21/2018 8.8 3.6 - 11.0 K/uL Final   RBC  Date Value Ref Range Status  08/31/2022 3.87 3.77 - 5.28 x10E6/uL Final  06/21/2018 4.16 3.80 - 5.20 MIL/uL Final   Hemoglobin  Date Value Ref Range Status  08/31/2022 11.1 11.1 - 15.9 g/dL Final   Hematocrit  Date Value Ref Range Status  08/31/2022 33.3 (L) 34.0 - 46.6 % Final   MCHC  Date Value Ref Range Status  08/31/2022 33.3 31.5 - 35.7 g/dL Final  06/21/2018 33.4 32.0 - 36.0 g/dL Final   [facility]  Date Value Ref Range Status  08/31/2022 28.7 26.6 - 33.0 pg Final  06/21/2018 29.8 26.0 - 34.0 pg Final   MCV  Date Value Ref Range Status  08/31/2022 86 79 - 97 fL Final   No results found for: "PLTCOUNTKUC", "LABPLAT", "POCPLA" RDW  Date Value Ref Range Status  08/31/2022 12.7 11.7 - 15.4 % Final         Passed - Cr in normal range and within 360 days    Creat  Date Value Ref Range Status  08/31/2017 0.94 (H) 0.60 - 0.93 mg/dL Final    Comment:    For patients >89 years of age, the reference limit for Creatinine is approximately 13% higher for people identified as African-American. .    Creatinine, Ser  Date Value Ref Range Status  08/31/2022 0.86 0.57 - 1.00 mg/dL Final         Passed - HBA1C is between 0 and 7.9 and within 180 days    Hemoglobin A1C  Date Value Ref Range Status  05/10/2022 6.8 (A) 4.0 - 5.6 % Final   Hgb A1c MFr Bld  Date Value Ref Range  Status  01/06/2022 7.3 (H) 4.8 - 5.6 % Final    Comment:             Prediabetes: 5.7 - 6.4          Diabetes: >6.4          Glycemic control for adults with diabetes: <7.0          Passed - eGFR in normal range and within 360 days    GFR calc Af Amer  Date Value Ref Range Status  02/21/2020 73 >59 mL/min/1.73 Final   GFR calc non Af Amer  Date Value Ref Range Status  02/21/2020 63 >59 mL/min/1.73 Final   eGFR  Date Value Ref Range Status  08/31/2022 68 >59 mL/min/1.73 Final         Passed - Valid encounter within last 6 months    Recent Outpatient Visits          3 weeks ago Left lower quadrant abdominal pain   [facility] Simmons-Robinson, [place], MD   4 months ago Type 2 diabetes mellitus without complication,  without long-term current use of insulin [facility])   [facility] Jerrol Banana., MD   8 months ago Type 2 diabetes mellitus without complication, without long-term current use of insulin [facility])   [facility] Jerrol Banana., MD   11 months ago Non-recurrent acute suppurative otitis media of left ear without spontaneous rupture of tympanic membrane   [facility] Mikey Kirschner, PA-C   1 year ago Encounter for annual wellness exam in Medicare patient   [facility] Jerrol Banana., MD      Future Appointments            In 3 weeks Simmons-Robinson, Riki Sheer, MD [facility] [place]   In 6 months Ralene Bathe, MD [place]

## 2022-10-08 NOTE — Progress Notes (Deleted)
Annual Wellness Visit     Patient: Jennifer Mcgee, Female    DOB: October 14, 1941, 81 y.o.   MRN: [id number] Visit Date: 10/13/2022  Today's Provider: Eulis Foster, MD   No chief complaint on file.  Subjective    Jennifer Mcgee is a 81 y.o. female who presents today for her Annual Wellness Visit. She reports consuming a {diet types:17450} diet. {Exercise:19826} She generally feels {well/fairly well/poorly:18703}. She reports sleeping {well/fairly well/poorly:18703}. She {does/does not:200015} have additional problems to discuss today.   HPI    Medications: Outpatient Medications Prior to Visit  Medication Sig   Accu-Chek FastClix Lancets MISC Check blood glucose once daily   atorvastatin (LIPITOR) 20 MG tablet TAKE 1 TABLET BY MOUTH EVERYDAY AT BEDTIME   ferrous sulfate 325 (65 FE) MG tablet Take 325 mg by mouth daily with breakfast.    glipiZIDE (GLUCOTROL XL) 2.5 MG 24 hr tablet Take 1 tablet by mouth once daily   glucose blood (ACCU-CHEK SMARTVIEW) test strip USE WITH METER TO CHECK GLUCOSE DAILY   hydrochlorothiazide (MICROZIDE) 12.5 MG capsule TAKE 1 CAPSULE BY MOUTH EVERY DAY   lisinopril (ZESTRIL) 40 MG tablet TAKE 1 TABLET BY MOUTH EVERY DAY   magnesium gluconate (MAGONATE) 500 MG tablet Take 500 mg by mouth daily.    metFORMIN (GLUCOPHAGE) 1000 MG tablet TAKE 1 TABLET (1,000 MG TOTAL) BY MOUTH TWICE A DAY WITH FOOD   Multiple Vitamin (MULTIVITAMIN) tablet Take 1 tablet by mouth daily.   Multiple Vitamins-Minerals (PRESERVISION AREDS 2 PO) Take 1 capsule by mouth 2 (two) times daily.   vitamin B-12 (CYANOCOBALAMIN) 1000 MCG tablet Take 1,000 mcg by mouth daily.   No facility-administered medications prior to visit.    Allergies  Allergen Reactions   Epinephrine Other (See Comments)    Feels jumpy Jerking movements all over the body.    Penicillins Itching and Other (See Comments)    Has patient had a PCN reaction causing immediate rash,  facial/tongue/throat swelling, SOB or lightheadedness with hypotension: Yes Has patient had a PCN reaction causing severe rash involving mucus membranes or skin necrosis: No Has patient had a PCN reaction that required hospitalization: No Has patient had a PCN reaction occurring within the last 10 years: No If all of the above answers are "NO", then may proceed with Cephalosporin use.    Levaquin [Levofloxacin In D5w] Anxiety    Patient Care Team: Eulis Foster, MD as PCP - General (Family Medicine) Christene Lye, MD (General Surgery) Earnestine Leys, MD as Consulting Physician (Specialist) Ralene Bathe, MD as Consulting Physician (Dermatology)  Review of Systems  {Labs  Heme  Chem  Endocrine  Serology  Results Review (optional):23779}    Objective    Vitals: There were no vitals taken for this visit. {Show previous vital signs (optional):23777}   Physical Exam ***  Most recent functional status assessment:    08/31/2022    2:02 PM  In your present state of health, do you have any difficulty performing the following activities:  Hearing? 0  Vision? 1  Difficulty concentrating or making decisions? 0  Walking or climbing stairs? 0  Dressing or bathing? 0  Doing errands, shopping? 0   Most recent fall risk assessment:    08/31/2022    2:02 PM  Fall Risk   Falls in the past year? 0  Number falls in past yr: 0  Injury with Fall? 0  Risk for fall due to : No Fall Risks  Most recent depression screenings:    08/31/2022    2:02 PM 01/06/2022   10:46 AM  PHQ 2/9 Scores  PHQ - 2 Score 0 1  PHQ- 9 Score  1   Most recent cognitive screening:     No data to display         Most recent Audit-C alcohol use screening    08/31/2022    2:02 PM  Alcohol Use Disorder Test (AUDIT)  1. How often do you have a drink containing alcohol? 0  2. How many drinks containing alcohol do you have on a typical day when you are drinking? 0  3. How  often do you have six or more drinks on one occasion? 0  AUDIT-C Score 0   A score of 3 or more in women, and 4 or more in men indicates increased risk for alcohol abuse, EXCEPT if all of the points are from question 1   No results found for any visits on 10/13/22.  Assessment & Plan     Annual wellness visit done today including the all of the following: Reviewed patient's Family Medical History Reviewed and updated list of patient's medical providers Assessment of cognitive impairment was done Assessed patient's functional ability Established a written schedule for health screening [place] Completed and Reviewed  Exercise Activities and Dietary recommendations  Goals      Reduce calorie intake to 2000 calories per day     Recommend monitoring calorie intake and avoiding going over 2000 calories a day.          Immunization History  Administered Date(s) Administered   Influenza, High Dose Seasonal PF 09/17/2017, 09/13/2019, 09/11/2021   Influenza,inj,quad, With Preservative 12/06/2016   Influenza-Unspecified 10/21/2015, 01/01/2016, 09/21/2022   Moderna SARS-COV2 Booster Vaccination 09/06/2022   PFIZER(Purple Top)SARS-COV-2 Vaccination 12/20/2019, 01/10/2020, 09/25/2020   Pneumococcal Conjugate-13 03/22/2014   Pneumococcal-Unspecified 09/03/2013   Respiratory Syncytial Virus Vaccine,Recomb Aduvanted(Arexvy) 09/14/2022   Tdap 10/25/2014   Zoster Recombinat (Shingrix) 09/13/2019, 11/13/2019   Zoster, Live 11/01/2011    Health Maintenance  Topic Date Due   Diabetic kidney evaluation - Urine ACR  Never done   Pneumonia Vaccine 40+ Years old (2 - PPSV23 or PCV20) 03/23/2015   FOOT EXAM  10/04/2017   OPHTHALMOLOGY EXAM  06/18/2022   Medicare Annual Wellness (AWV)  08/26/2022   COVID-19 Vaccine (4 - Pfizer risk series) 11/01/2022   HEMOGLOBIN A1C  11/09/2022   Diabetic kidney evaluation - GFR measurement  09/01/2023   TETANUS/TDAP  10/25/2024    INFLUENZA VACCINE  Completed   DEXA SCAN  Completed   Zoster Vaccines- Shingrix  Completed   HPV VACCINES  Aged Out     Discussed health benefits of physical activity, and encouraged her to engage in regular exercise appropriate for her age and condition.    ***  No follow-ups on file.     {provider attestation***:1}   Eulis Foster, MD  [facility] [phone] (phone) [phone] (fax)  [place]

## 2022-10-11 ENCOUNTER — Ambulatory Visit: Payer: Medicare PPO | Admitting: Family Medicine

## 2022-10-13 ENCOUNTER — Encounter: Payer: Medicare PPO | Admitting: Family Medicine

## 2022-11-14 ENCOUNTER — Other Ambulatory Visit: Payer: Self-pay | Admitting: Family Medicine

## 2022-11-14 DIAGNOSIS — E119 Type 2 diabetes mellitus without complications: Secondary | ICD-10-CM

## 2022-11-19 ENCOUNTER — Other Ambulatory Visit: Payer: Self-pay | Admitting: Family Medicine

## 2022-11-19 DIAGNOSIS — I10 Essential (primary) hypertension: Secondary | ICD-10-CM

## 2022-11-19 MED ORDER — HYDROCHLOROTHIAZIDE 12.5 MG PO CAPS: ORAL_CAPSULE | ORAL | 1 refills | Status: AC

## 2022-11-19 NOTE — Telephone Encounter (Signed)
Medication Refill - Medication: hydrochlorothiazide (MICROZIDE) 12.5 MG capsule   Has the patient contacted their pharmacy? yes (Agent: If no, request that the patient contact the pharmacy for the refill. If patient does not wish to contact the pharmacy document the reason why and proceed with request.) (Agent: If yes, when and what did the pharmacy advise?)contact pcp  Preferred Pharmacy (with phone number or street name):  [id number] [address]: [phone] Fax: [phone]    Has the patient been seen for an appointment in the last year OR does the patient have an upcoming appointment? yes  Agent: Please be advised that RX refills may take up to 3 business days. We ask that you follow-up with your pharmacy.

## 2022-11-19 NOTE — Telephone Encounter (Signed)
Requested Prescriptions  Pending Prescriptions Disp Refills   hydrochlorothiazide (MICROZIDE) 12.5 MG capsule 90 capsule 1    Sig: TAKE 1 CAPSULE BY MOUTH EVERY DAY     Cardiovascular: Diuretics - Thiazide Failed - 11/19/2022 11:53 AM      Failed - Na in normal range and within 180 days    Sodium  Date Value Ref Range Status  08/31/2022 145 (H) 134 - 144 mmol/L Final         Failed - Last BP in normal range    BP Readings from Last 1 Encounters:  08/31/22 (!) 150/68         Passed - Cr in normal range and within 180 days    Creat  Date Value Ref Range Status  08/31/2017 0.94 (H) 0.60 - 0.93 mg/dL Final    Comment:    For patients >66 years of age, the reference limit for Creatinine is approximately 13% higher for people identified as African-American. .    Creatinine, Ser  Date Value Ref Range Status  08/31/2022 0.86 0.57 - 1.00 mg/dL Final         Passed - K in normal range and within 180 days    Potassium  Date Value Ref Range Status  08/31/2022 4.3 3.5 - 5.2 mmol/L Final         Passed - Valid encounter within last 6 months    Recent Outpatient Visits           2 months ago Left lower quadrant abdominal pain   [facility] Simmons-Robinson, [place], MD   6 months ago Type 2 diabetes mellitus without complication, without long-term current use of insulin [facility])   [facility] Jerrol Banana., MD   10 months ago Type 2 diabetes mellitus without complication, without long-term current use of insulin [facility])   [facility] Jerrol Banana., MD   1 year ago Non-recurrent acute suppurative otitis media of left ear without spontaneous rupture of tympanic membrane   [facility] Mikey Kirschner, PA-C   1 year ago Encounter for annual wellness exam in Medicare patient   [facility] Jerrol Banana., MD       Future Appointments             In 4 months Ralene Bathe, MD  [place]

## 2023-02-09 ENCOUNTER — Other Ambulatory Visit: Payer: Self-pay | Admitting: Family Medicine

## 2023-02-09 DIAGNOSIS — E119 Type 2 diabetes mellitus without complications: Secondary | ICD-10-CM

## 2023-03-14 ENCOUNTER — Ambulatory Visit (INDEPENDENT_AMBULATORY_CARE_PROVIDER_SITE_OTHER): Payer: Medicare PPO | Admitting: Dermatology

## 2023-03-14 ENCOUNTER — Encounter: Payer: Self-pay | Admitting: Dermatology

## 2023-03-14 ENCOUNTER — Other Ambulatory Visit: Payer: Self-pay | Admitting: Family Medicine

## 2023-03-14 VITALS — BP 140/68 | HR 97

## 2023-03-14 DIAGNOSIS — L918 Other hypertrophic disorders of the skin: Secondary | ICD-10-CM

## 2023-03-14 DIAGNOSIS — Z1283 Encounter for screening for malignant neoplasm of skin: Secondary | ICD-10-CM | POA: Diagnosis not present

## 2023-03-14 DIAGNOSIS — L821 Other seborrheic keratosis: Secondary | ICD-10-CM | POA: Diagnosis not present

## 2023-03-14 DIAGNOSIS — D492 Neoplasm of unspecified behavior of bone, soft tissue, and skin: Secondary | ICD-10-CM

## 2023-03-14 DIAGNOSIS — D229 Melanocytic nevi, unspecified: Secondary | ICD-10-CM

## 2023-03-14 DIAGNOSIS — Z85828 Personal history of other malignant neoplasm of skin: Secondary | ICD-10-CM | POA: Diagnosis not present

## 2023-03-14 DIAGNOSIS — L578 Other skin changes due to chronic exposure to nonionizing radiation: Secondary | ICD-10-CM | POA: Diagnosis not present

## 2023-03-14 DIAGNOSIS — D485 Neoplasm of uncertain behavior of skin: Secondary | ICD-10-CM

## 2023-03-14 DIAGNOSIS — L814 Other melanin hyperpigmentation: Secondary | ICD-10-CM

## 2023-03-14 DIAGNOSIS — D1801 Hemangioma of skin and subcutaneous tissue: Secondary | ICD-10-CM

## 2023-03-14 DIAGNOSIS — Z1231 Encounter for screening mammogram for malignant neoplasm of breast: Secondary | ICD-10-CM

## 2023-03-14 DIAGNOSIS — Z8589 Personal history of malignant neoplasm of other organs and systems: Secondary | ICD-10-CM

## 2023-03-14 NOTE — Progress Notes (Signed)
Follow-Up Visit   Subjective  Jennifer Mcgee is a 82 y.o. female who presents for the following: Skin Cancer Screening and Full Body Skin Exam. HxSCC. Area of concern at abdomen. Irritated, bled. The patient presents for Total-Body Skin Exam (TBSE) for skin cancer screening and mole check. The patient has spots, moles and lesions to be evaluated, some may be new or changing and the patient has concerns that these could be cancer.  The following portions of the chart were reviewed this encounter and updated as appropriate: medications, allergies, medical history  Review of Systems:  No other skin or systemic complaints except as noted in HPI or Assessment and Plan.  Objective  Well appearing patient in no apparent distress; mood and affect are within normal limits.  A full examination was performed including scalp, head, eyes, ears, nose, lips, neck, chest, axillae, abdomen, back, buttocks, bilateral upper extremities, bilateral lower extremities, hands, feet, fingers, toes, fingernails, and toenails. All findings within normal limits unless otherwise noted below.   Relevant physical exam findings are noted in the Assessment and Plan.  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 2017. - No evidence of recurrence today - No lymphadenopathy - Recommend regular full body skin exams - Recommend daily broad spectrum sunscreen SPF 30+ to sun-exposed areas, reapply every 2 hours as needed.  - Call if any new or changing lesions are noted between office visits  LENTIGINES, SEBORRHEIC KERATOSES, HEMANGIOMAS - Benign normal skin lesions - Benign-appearing - Call for any changes  MELANOCYTIC NEVI - Tan-brown and/or pink-flesh-colored symmetric macules and papules - Benign appearing on exam today - Observation - Call clinic for new or changing moles - Recommend daily use of broad spectrum spf  30+ sunscreen to sun-exposed areas.   ACTINIC DAMAGE - Chronic condition, secondary to cumulative UV/sun exposure - diffuse scaly erythematous macules with underlying dyspigmentation - Recommend daily broad spectrum sunscreen SPF 30+ to sun-exposed areas, reapply every 2 hours as needed.  - Staying in the shade or wearing long sleeves, sun glasses (UVA+UVB protection) and wide brim hats (4-inch brim around the entire circumference of the hat) are also recommended for sun protection.  - Call for new or changing lesions.  SKIN CANCER SCREENING PERFORMED TODAY.  ACROCHORDON, Irritated (Skin Tag) - Removal desired by patient - Fleshy, skin-colored pedunculated papules - Benign appearing.  - Patient desires removal. Reviewed that this is not covered by insurance and they will be charged a cosmetic fee for removal. Patient signed non-covered consent.  - Prior to procedure, discussed risks of blister formation, small wound, skin dyspigmentation, or rare scar following cryotherapy.   Destruction Procedure Note Destruction method: cryotherapy   Informed consent: discussed and consent obtained   Lesion destroyed using liquid nitrogen: Yes   Outcome: patient tolerated procedure well with no complications   Post-procedure details: wound care instructions given   Locations: Left abdomen x1 # of Lesions Treated: 1  Prior to procedure, discussed risks of blister formation, small wound, skin dyspigmentation, or rare scar following cryotherapy. Recommend Vaseline ointment to treated areas while healing.   Neoplasm of skin Left Epigastric  Epidermal / dermal shaving  Lesion diameter (cm):  0.6 Informed consent: discussed and consent obtained   Timeout: patient name, date of birth, surgical site, and procedure verified   Procedure prep:  Patient was prepped and draped in usual sterile fashion Prep  type:  Isopropyl alcohol Anesthesia: the lesion was anesthetized in a standard fashion   Anesthetic:   1% lidocaine w/ epinephrine 1-100,000 buffered w/ 8.4% NaHCO3 Instrument used: flexible razor blade   Hemostasis achieved with: pressure, aluminum chloride and electrodesiccation   Outcome: patient tolerated procedure well   Post-procedure details: sterile dressing applied and wound care instructions given   Dressing type: bandage and petrolatum    Specimen 1 - Surgical pathology Differential Diagnosis: Irritated hemangioma, R/O atypia  Check Margins: No  Skin tag  Skin cancer screening  Seborrheic keratosis  Actinic skin damage  History of squamous cell carcinoma  Lentigo  Melanocytic nevus, unspecified location  Hemangioma of skin   Return in about 1 year (around 03/13/2024) for TBSE, HxSCC.  I, Lawson RadarJill Parcell, CMA, am acting as scribe for Armida Sansavid Eugene Zeiders, MD.  Documentation: I have reviewed the above documentation for accuracy and completeness, and I agree with the above.  Armida Sansavid Danyl Deems, MD

## 2023-03-14 NOTE — Patient Instructions (Addendum)
Wound Care Instructions  Cleanse wound gently with soap and water once a day then pat dry with clean gauze. Apply a thin coat of Petrolatum (petroleum jelly, "Vaseline") over the wound (unless you have an allergy to this). We recommend that you use a new, sterile tube of Vaseline. Do not pick or remove scabs. Do not remove the yellow or white "healing tissue" from the base of the wound.  Cover the wound with fresh, clean, nonstick gauze and secure with paper tape. You may use Band-Aids in place of gauze and tape if the wound is small enough, but would recommend trimming much of the tape off as there is often too much. Sometimes Band-Aids can irritate the skin.  You should call the office for your biopsy report after 1 week if you have not already been contacted.  If you experience any problems, such as abnormal amounts of bleeding, swelling, significant bruising, significant pain, or evidence of infection, please call the office immediately.  FOR ADULT SURGERY PATIENTS: If you need something for pain relief you may take 1 extra strength Tylenol (acetaminophen) AND 2 Ibuprofen (200mg each) together every 4 hours as needed for pain. (do not take these if you are allergic to them or if you have a reason you should not take them.) Typically, you may only need pain medication for 1 to 3 days.    Cryotherapy Aftercare  Wash gently with soap and water everyday.   Apply Vaseline and Band-Aid daily until healed.    Recommend daily broad spectrum sunscreen SPF 30+ to sun-exposed areas, reapply every 2 hours as needed. Call for new or changing lesions.  Staying in the shade or wearing long sleeves, sun glasses (UVA+UVB protection) and wide brim hats (4-inch brim around the entire circumference of the hat) are also recommended for sun protection.   Melanoma ABCDEs  Melanoma is the most dangerous type of skin cancer, and is the leading cause of death from skin disease.  You are more likely to develop  melanoma if you: Have light-colored skin, light-colored eyes, or red or blond hair Spend a lot of time in the sun Tan regularly, either outdoors or in a tanning bed Have had blistering sunburns, especially during childhood Have a close family member who has had a melanoma Have atypical moles or large birthmarks  Early detection of melanoma is key since treatment is typically straightforward and cure rates are extremely high if we catch it early.   The first sign of melanoma is often a change in a mole or a new dark spot.  The ABCDE system is a way of remembering the signs of melanoma.  A for asymmetry:  The two halves do not match. B for border:  The edges of the growth are irregular. C for color:  A mixture of colors are present instead of an even brown color. D for diameter:  Melanomas are usually (but not always) greater than 6mm - the size of a pencil eraser. E for evolution:  The spot keeps changing in size, shape, and color.  Please check your skin once per month between visits. You can use a small mirror in front and a large mirror behind you to keep an eye on the back side or your body.   If you see any new or changing lesions before your next follow-up, please call to schedule a visit.  Please continue daily skin protection including broad spectrum sunscreen SPF 30+ to sun-exposed areas, reapplying every 2 hours as needed   when you're outdoors.   Staying in the shade or wearing long sleeves, sun glasses (UVA+UVB protection) and wide brim hats (4-inch brim around the entire circumference of the hat) are also recommended for sun protection.    Due to recent changes in healthcare laws, you may see results of your pathology and/or laboratory studies on MyChart before the doctors have had a chance to review them. We understand that in some cases there may be results that are confusing or concerning to you. Please understand that not all results are received at the same time and often the  doctors may need to interpret multiple results in order to provide you with the best plan of care or course of treatment. Therefore, we ask that you please give us 2 business days to thoroughly review all your results before contacting the office for clarification. Should we see a critical lab result, you will be contacted sooner.   If You Need Anything After Your Visit  If you have any questions or concerns for your doctor, please call our main line at [phone] and press option 4 to reach your doctor's medical assistant. If no one answers, please leave a voicemail as directed and we will return your call as soon as possible. Messages left after 4 pm will be answered the following business day.   You may also send us a message via MyChart. We typically respond to MyChart messages within 1-2 business days.  For prescription refills, please ask your pharmacy to contact our office. Our fax number is [phone].  If you have an urgent issue when the clinic is closed that cannot wait until the next business day, you can page your doctor at the number below.    Please note that while we do our best to be available for urgent issues outside of office hours, we are not available 24/7.   If you have an urgent issue and are unable to reach us, you may choose to seek medical care at your doctor's office, retail clinic, urgent care center, or emergency room.  If you have a medical emergency, please immediately call 911 or go to the emergency department.  Pager Numbers  - Dr. Kowalski: [phone]  - Dr. Moye: [phone]  - Dr. Stewart: [phone]  In the event of inclement weather, please call our main line at [phone] for an update on the status of any delays or closures.  Dermatology Medication Tips: Please keep the boxes that topical medications come in in order to help keep track of the instructions about where and how to use these. Pharmacies typically print the medication  instructions only on the boxes and not directly on the medication tubes.   If your medication is too expensive, please contact our office at [phone] option 4 or send us a message through MyChart.   We are unable to tell what your co-pay for medications will be in advance as this is different depending on your insurance coverage. However, we may be able to find a substitute medication at lower cost or fill out paperwork to get insurance to cover a needed medication.   If a prior authorization is required to get your medication covered by your insurance company, please allow us 1-2 business days to complete this process.  Drug prices often vary depending on where the prescription is filled and some pharmacies may offer cheaper prices.  The website www.goodrx.com contains coupons for medications through different pharmacies. The prices here do not account for what the cost   may be with help from insurance (it may be cheaper with your insurance), but the website can give you the price if you did not use any insurance.  - You can print the associated coupon and take it with your prescription to the pharmacy.  - You may also stop by our office during regular business hours and pick up a GoodRx coupon card.  - If you need your prescription sent electronically to a different pharmacy, notify our office through [facility] MyChart or by phone at [phone] option 4.     Si Usted Necesita Algo Despus de Su Visita  Tambin puede enviarnos un mensaje a travs de MyChart. Por lo general respondemos a los mensajes de MyChart en el transcurso de 1 a 2 das hbiles.  Para renovar recetas, por favor pida a su farmacia que se ponga en contacto con nuestra oficina. Nuestro nmero de fax es el [phone].  Si tiene un asunto urgente cuando la clnica est cerrada y que no puede esperar hasta el siguiente da hbil, puede llamar/localizar a su doctor(a) al nmero que aparece a continuacin.   Por  favor, tenga en cuenta que aunque hacemos todo lo posible para estar disponibles para asuntos urgentes fuera del horario de oficina, no estamos disponibles las 24 horas del da, los 7 das de la semana.   Si tiene un problema urgente y no puede comunicarse con nosotros, puede optar por buscar atencin mdica  en el consultorio de su doctor(a), en una clnica privada, en un centro de atencin urgente o en una sala de emergencias.  Si tiene una emergencia mdica, por favor llame inmediatamente al 911 o vaya a la sala de emergencias.  Nmeros de bper  - Dr. Kowalski: [phone]  - Dra. Moye: [phone]  - Dra. Stewart: [phone]  En caso de inclemencias del tiempo, por favor llame a nuestra lnea principal al [phone] para una actualizacin sobre el estado de cualquier retraso o cierre.  Consejos para la medicacin en dermatologa: Por favor, guarde las cajas en las que vienen los medicamentos de uso tpico para ayudarle a seguir las instrucciones sobre dnde y cmo usarlos. Las farmacias generalmente imprimen las instrucciones del medicamento slo en las cajas y no directamente en los tubos del medicamento.   Si su medicamento es muy caro, por favor, pngase en contacto con nuestra oficina llamando al [phone] y presione la opcin 4 o envenos un mensaje a travs de MyChart.   No podemos decirle cul ser su copago por los medicamentos por adelantado ya que esto es diferente dependiendo de la cobertura de su seguro. Sin embargo, es posible que podamos encontrar un medicamento sustituto a menor costo o llenar un formulario para que el seguro cubra el medicamento que se considera necesario.   Si se requiere una autorizacin previa para que su compaa de seguros cubra su medicamento, por favor permtanos de 1 a 2 das hbiles para completar este proceso.  Los precios de los medicamentos varan con frecuencia dependiendo del lugar de dnde se surte la receta y alguna farmacias  pueden ofrecer precios ms baratos.  El sitio web www.goodrx.com tiene cupones para medicamentos de diferentes farmacias. Los precios aqu no tienen en cuenta lo que podra costar con la ayuda del seguro (puede ser ms barato con su seguro), pero el sitio web puede darle el precio si no utiliz ningn seguro.  - Puede imprimir el cupn correspondiente y llevarlo con su receta a la farmacia.  - Tambin puede pasar por nuestra   oficina durante el horario de atencin regular y recoger una tarjeta de cupones de GoodRx.  - Si necesita que su receta se enve electrnicamente a una farmacia diferente, informe a nuestra oficina a travs de MyChart de [facility] o por telfono llamando al [phone] y presione la opcin 4.  

## 2023-03-17 ENCOUNTER — Telehealth: Payer: Self-pay

## 2023-03-17 NOTE — Telephone Encounter (Signed)
Advised patient of pathology results/hd 

## 2023-03-17 NOTE — Telephone Encounter (Signed)
-----   Message from Deirdre Evener, MD sent at 03/17/2023  1:57 PM EDT ----- Diagnosis Skin , left epigastric HEMANGIOMA WITH THROMBOSIS  Benign hemangioma No further treatment needed

## 2023-03-24 ENCOUNTER — Ambulatory Visit: Payer: Medicare PPO | Admitting: Dermatology

## 2023-03-28 ENCOUNTER — Encounter: Payer: Self-pay | Admitting: Dermatology

## 2023-04-01 ENCOUNTER — Ambulatory Visit
Admission: RE | Admit: 2023-04-01 | Discharge: 2023-04-01 | Disposition: A | Discharge status: Home / Self Care | Payer: Medicare PPO | Source: Ambulatory Visit | Attending: Family Medicine | Admitting: Family Medicine

## 2023-04-01 DIAGNOSIS — Z1231 Encounter for screening mammogram for malignant neoplasm of breast: Secondary | ICD-10-CM | POA: Insufficient documentation

## 2023-06-15 ENCOUNTER — Telehealth: Payer: Self-pay | Admitting: Family Medicine

## 2023-06-15 NOTE — Telephone Encounter (Signed)
Patient's seeing Dr. Julieanne Manson at [facility].

## 2023-08-02 ENCOUNTER — Other Ambulatory Visit: Payer: Self-pay | Admitting: Family Medicine

## 2023-08-02 DIAGNOSIS — E119 Type 2 diabetes mellitus without complications: Secondary | ICD-10-CM

## 2024-02-27 ENCOUNTER — Other Ambulatory Visit: Payer: Self-pay | Admitting: Family Medicine

## 2024-02-27 DIAGNOSIS — Z1231 Encounter for screening mammogram for malignant neoplasm of breast: Secondary | ICD-10-CM

## 2024-03-15 ENCOUNTER — Ambulatory Visit: Payer: Medicare PPO | Admitting: Dermatology

## 2024-03-27 ENCOUNTER — Ambulatory Visit (INDEPENDENT_AMBULATORY_CARE_PROVIDER_SITE_OTHER): Admitting: Dermatology

## 2024-03-27 ENCOUNTER — Encounter: Payer: Self-pay | Admitting: Dermatology

## 2024-03-27 DIAGNOSIS — D1801 Hemangioma of skin and subcutaneous tissue: Secondary | ICD-10-CM

## 2024-03-27 DIAGNOSIS — L814 Other melanin hyperpigmentation: Secondary | ICD-10-CM

## 2024-03-27 DIAGNOSIS — L905 Scar conditions and fibrosis of skin: Secondary | ICD-10-CM

## 2024-03-27 DIAGNOSIS — L578 Other skin changes due to chronic exposure to nonionizing radiation: Secondary | ICD-10-CM

## 2024-03-27 DIAGNOSIS — Z1283 Encounter for screening for malignant neoplasm of skin: Secondary | ICD-10-CM | POA: Diagnosis not present

## 2024-03-27 DIAGNOSIS — Z85828 Personal history of other malignant neoplasm of skin: Secondary | ICD-10-CM

## 2024-03-27 DIAGNOSIS — Z8589 Personal history of malignant neoplasm of other organs and systems: Secondary | ICD-10-CM

## 2024-03-27 DIAGNOSIS — T1490XS Injury, unspecified, sequela: Secondary | ICD-10-CM

## 2024-03-27 DIAGNOSIS — W908XXA Exposure to other nonionizing radiation, initial encounter: Secondary | ICD-10-CM

## 2024-03-27 DIAGNOSIS — L820 Inflamed seborrheic keratosis: Secondary | ICD-10-CM | POA: Diagnosis not present

## 2024-03-27 DIAGNOSIS — L821 Other seborrheic keratosis: Secondary | ICD-10-CM

## 2024-03-27 DIAGNOSIS — D229 Melanocytic nevi, unspecified: Secondary | ICD-10-CM

## 2024-03-27 NOTE — Progress Notes (Signed)
 Follow-Up Visit   Subjective  Jennifer Mcgee is a 83 y.o. female who presents for the following: Skin Cancer Screening and Full Body Skin Exam  The patient presents for Total-Body Skin Exam (TBSE) for skin cancer screening and mole check. The patient has spots, moles and lesions to be evaluated, some may be new or changing and the patient may have concern these could be cancer.  The following portions of the chart were reviewed this encounter and updated as appropriate: medications, allergies, medical history  Review of Systems:  No other skin or systemic complaints except as noted in HPI or Assessment and Plan.  Objective  Well appearing patient in no apparent distress; mood and affect are within normal limits.  A full examination was performed including scalp, head, eyes, ears, nose, lips, neck, chest, axillae, abdomen, back, buttocks, bilateral upper extremities, bilateral lower extremities, hands, feet, fingers, toes, fingernails, and toenails. All findings within normal limits unless otherwise noted below.   Relevant physical exam findings are noted in the Assessment and Plan. R post deltoid x 1, R inframammary x 19 (20) Erythematous stuck-on, waxy papule or plaque  Assessment & Plan   SKIN CANCER SCREENING PERFORMED TODAY.  ACTINIC DAMAGE - Chronic condition, secondary to cumulative UV/sun exposure - diffuse scaly erythematous macules with underlying dyspigmentation - Recommend daily broad spectrum sunscreen SPF 30+ to sun-exposed areas, reapply every 2 hours as needed.  - Staying in the shade or wearing long sleeves, sun glasses (UVA+UVB protection) and wide brim hats (4-inch brim around the entire circumference of the hat) are also recommended for sun protection.  - Call for new or changing lesions.  LENTIGINES, SEBORRHEIC KERATOSES, HEMANGIOMAS - Benign normal skin lesions - Benign-appearing - Call for any changes  MELANOCYTIC NEVI - Tan-brown and/or  pink-flesh-colored symmetric macules and papules - Benign appearing on exam today - Observation - Call clinic for new or changing moles - Recommend daily use of broad spectrum spf 30+ sunscreen to sun-exposed areas.   HISTORY OF SQUAMOUS CELL CARCINOMA OF THE SKIN - R chest parasternal 02/11/2016 - No evidence of recurrence today - No lymphadenopathy - Recommend regular full body skin exams - Recommend daily broad spectrum sunscreen SPF 30+ to sun-exposed areas, reapply every 2 hours as needed.  - Call if any new or changing lesions are noted between office visits   INFLAMED SEBORRHEIC KERATOSIS (20) R post deltoid x 1, R inframammary x 19 (20) Symptomatic, irritating, patient would like treated.  Destruction of lesion - R post deltoid x 1, R inframammary x 19 (20) Complexity: simple   Destruction method: cryotherapy   Informed consent: discussed and consent obtained   Timeout:  patient name, date of birth, surgical site, and procedure verified Lesion destroyed using liquid nitrogen: Yes   Region frozen until ice ball extended beyond lesion: Yes   Outcome: patient tolerated procedure well with no complications   Post-procedure details: wound care instructions given    Traumatic injury SCAR - R ear Exam: Dyspigmented smooth macule or patch. Benign-appearing.  Observation.  Call clinic for new or changing lesions. Recommend daily broad spectrum sunscreen SPF 30+, reapply every 2 hours as needed. Treatment: Recommend Serica moisturizing scar formula cream every night or [facility] brand or Mederma silicone scar sheet every night for the first year after a scar appears to help with scar remodeling if desired. Scars remodel on their own for a full year and will gradually improve in appearance over time.  Return in about 1  year (around 03/27/2025) for TBSE - hx SCC, 4 mth ISK follow up.  Arlinda Lais, CMA, am acting as scribe for Celine Collard, MD .   Documentation: I have reviewed  the above documentation for accuracy and completeness, and I agree with the above.  Celine Collard, MD

## 2024-03-27 NOTE — Patient Instructions (Signed)

## 2024-04-02 ENCOUNTER — Ambulatory Visit
Admission: RE | Admit: 2024-04-02 | Discharge: 2024-04-02 | Disposition: A | Discharge status: Home / Self Care | Source: Ambulatory Visit | Attending: Family Medicine | Admitting: Family Medicine

## 2024-04-02 DIAGNOSIS — Z1231 Encounter for screening mammogram for malignant neoplasm of breast: Secondary | ICD-10-CM | POA: Diagnosis present

## 2024-06-12 LAB — HM DIABETES EYE EXAM

## 2024-07-30 ENCOUNTER — Ambulatory Visit: Admitting: Dermatology

## 2025-03-27 ENCOUNTER — Ambulatory Visit: Admitting: Dermatology
